# Patient Record
Sex: Female | Born: 1965 | Race: White | Hispanic: No | Marital: Married | Smoking: Former smoker
Health system: Southern US, Community
[De-identification: ages and names within clinical notes are randomized; demographics above are authoritative.]

## PROBLEM LIST (undated history)

## (undated) DIAGNOSIS — Z9889 Other specified postprocedural states: Secondary | ICD-10-CM

## (undated) DIAGNOSIS — S32009K Unspecified fracture of unspecified lumbar vertebra, subsequent encounter for fracture with nonunion: Secondary | ICD-10-CM

## (undated) DIAGNOSIS — T8859XA Other complications of anesthesia, initial encounter: Secondary | ICD-10-CM

## (undated) DIAGNOSIS — F419 Anxiety disorder, unspecified: Secondary | ICD-10-CM

## (undated) DIAGNOSIS — R112 Nausea with vomiting, unspecified: Secondary | ICD-10-CM

## (undated) HISTORY — PX: BREAST SURGERY: SHX581

## (undated) HISTORY — PX: BREAST IMPLANT REMOVAL: SUR1101

## (undated) HISTORY — PX: TUBAL LIGATION: SHX77

## (undated) HISTORY — PX: BACK SURGERY: SHX140

## (undated) HISTORY — PX: JOINT REPLACEMENT: SHX530

## (undated) HISTORY — PX: FOOT SURGERY: SHX648

## (undated) HISTORY — PX: ABLATION: SHX5711

---

## 1989-08-09 HISTORY — PX: WISDOM TOOTH EXTRACTION: SHX21

## 2003-08-10 HISTORY — PX: LIPOMA EXCISION: SHX5283

## 2010-07-24 ENCOUNTER — Emergency Department (HOSPITAL_BASED_OUTPATIENT_CLINIC_OR_DEPARTMENT_OTHER)
Admission: EM | Admit: 2010-07-24 | Discharge: 2010-07-24 | Payer: Self-pay | Source: Home / Self Care | Admitting: Emergency Medicine

## 2010-08-09 HISTORY — PX: APPENDECTOMY: SHX54

## 2010-08-09 HISTORY — PX: MENISCUS REPAIR: SHX5179

## 2011-04-03 ENCOUNTER — Emergency Department (HOSPITAL_BASED_OUTPATIENT_CLINIC_OR_DEPARTMENT_OTHER)
Admission: EM | Admit: 2011-04-03 | Discharge: 2011-04-03 | Disposition: A | Discharge status: Home / Self Care | Payer: Self-pay | Attending: Emergency Medicine | Admitting: Emergency Medicine

## 2011-04-03 ENCOUNTER — Encounter: Payer: Self-pay | Admitting: *Deleted

## 2011-04-03 DIAGNOSIS — G43909 Migraine, unspecified, not intractable, without status migrainosus: Secondary | ICD-10-CM | POA: Insufficient documentation

## 2011-04-03 DIAGNOSIS — R1110 Vomiting, unspecified: Secondary | ICD-10-CM | POA: Insufficient documentation

## 2011-04-03 DIAGNOSIS — A084 Viral intestinal infection, unspecified: Secondary | ICD-10-CM

## 2011-04-03 DIAGNOSIS — A088 Other specified intestinal infections: Secondary | ICD-10-CM | POA: Insufficient documentation

## 2011-04-03 LAB — URINALYSIS, ROUTINE W REFLEX MICROSCOPIC
Glucose, UA: NEGATIVE mg/dL
Ketones, ur: 15 mg/dL — AB
Leukocytes, UA: NEGATIVE
Nitrite: NEGATIVE
Protein, ur: NEGATIVE mg/dL
pH: 5.5 (ref 5.0–8.0)

## 2011-04-03 LAB — URINE MICROSCOPIC-ADD ON

## 2011-04-03 MED ORDER — METOCLOPRAMIDE HCL 5 MG/ML IJ SOLN
10.0000 mg | Freq: Once | INTRAMUSCULAR | Status: AC
  Administered 2011-04-03: 10 mg via INTRAVENOUS
  Filled 2011-04-03: qty 2

## 2011-04-03 MED ORDER — ONDANSETRON HCL 4 MG PO TABS: 4.0000 mg | ORAL_TABLET | Freq: Four times a day (QID) | ORAL | Status: AC

## 2011-04-03 MED ORDER — ONDANSETRON HCL 4 MG/2ML IJ SOLN
4.0000 mg | Freq: Once | INTRAMUSCULAR | Status: AC
  Administered 2011-04-03: 4 mg via INTRAVENOUS
  Filled 2011-04-03: qty 2

## 2011-04-03 MED ORDER — SODIUM CHLORIDE 0.9 % IV BOLUS (SEPSIS)
1000.0000 mL | Freq: Once | INTRAVENOUS | Status: AC
  Administered 2011-04-03: 1000 mL via INTRAVENOUS

## 2011-04-03 MED ORDER — DIPHENHYDRAMINE HCL 50 MG/ML IJ SOLN
25.0000 mg | Freq: Once | INTRAMUSCULAR | Status: AC
  Administered 2011-04-03: 25 mg via INTRAVENOUS
  Filled 2011-04-03: qty 1

## 2011-04-03 MED ORDER — KETOROLAC TROMETHAMINE 30 MG/ML IJ SOLN
30.0000 mg | Freq: Once | INTRAMUSCULAR | Status: AC
  Administered 2011-04-03: 30 mg via INTRAVENOUS
  Filled 2011-04-03: qty 1

## 2011-04-03 NOTE — ED Notes (Signed)
Pt c/o n/v/d onset earlier today. Also feeling weak, shaky and has a headache. Hx headaches and migraines.

## 2011-04-03 NOTE — ED Notes (Signed)
Pt provided cranberry juice per request (MD aware)

## 2011-04-03 NOTE — ED Provider Notes (Signed)
History   Chart scribed for [name], MD by Enos Fling; the patient was seen in room MH12/MH12; this patient's care was started at 6:47 PM.    CSN: [id number] Arrival date & time: 04/03/2011  6:39 PM  Chief Complaint  Patient presents with  . Emesis   HPI Autumn Rose is a 45 y.o. female who presents to the Emergency Department complaining of n/v/d. Pt reports n/v/d onset this AM and have been persistent throughout the day with associated headache and generalized weakness. N/V worse with eating. Pt reports h/o ha and this ha is same. No fever at home. No sick contacts or new/unusual foods. Denies blood in emesis or stool, vision changes, or dysuria. Pt drinking a lot of water today so urinating more than usual.    History reviewed. No pertinent past medical history.  Past Surgical History  Procedure Date  . Tubal ligation   . Breast surgery     No family history on file.  History  Substance Use Topics  . Smoking status: Never Smoker   . Smokeless tobacco: Not on file  . Alcohol Use: Yes    OB History    Grav Para Term Preterm Abortions TAB SAB Ect Mult Living                  Review of Systems 10 Systems reviewed and are negative for acute change except as noted in the HPI.  Physical Exam  BP 139/93  Pulse 110  Temp(Src) 98.5 F (36.9 C) (Oral)  Resp 20  Ht 5' 5.5" (1.664 m)  Wt 157 lb (71.215 kg)  BMI 25.73 kg/m2  SpO2 100%  LMP 03/13/2011  Physical Exam  Nursing note and vitals reviewed. Constitutional: She is oriented to person, place, and time. She appears well-developed and well-nourished. No distress.  HENT:  Head: Normocephalic.  Mouth/Throat: Oropharynx is clear and moist and mucous membranes are normal.  Eyes: Conjunctivae are normal. Pupils are equal, round, and reactive to light.  Neck: Normal range of motion. Neck supple.  Cardiovascular: Normal rate, regular rhythm and intact distal pulses.  Exam reveals no gallop and no friction  rub.   No murmur heard. Pulmonary/Chest: Effort normal and breath sounds normal. She has no wheezes. She has no rales.  Abdominal: Soft. Bowel sounds are normal. There is no tenderness.       No CVA tenderness  Musculoskeletal: Normal range of motion. She exhibits no edema.  Neurological: She is alert and oriented to person, place, and time.       Normal strength BUE and BLE  Skin: Skin is warm and dry. No rash noted.  Psychiatric: She has a normal mood and affect.    ED Course  Procedures  OTHER DATA REVIEWED: Nursing notes and vital signs reviewed. Prior records reviewed.   LABS / RADIOLOGY: Results for orders placed during the hospital encounter of 04/03/11  URINALYSIS, ROUTINE W REFLEX MICROSCOPIC      Component Value Range   Color, Urine YELLOW  YELLOW    Appearance CLEAR  CLEAR    Specific Gravity, Urine 1.013  1.005 - 1.030    pH 5.5  5.0 - 8.0    Glucose, UA NEGATIVE  NEGATIVE (mg/dL)   Hgb urine dipstick TRACE (*) NEGATIVE    Bilirubin Urine NEGATIVE  NEGATIVE    Ketones, ur 15 (*) NEGATIVE (mg/dL)   Protein, ur NEGATIVE  NEGATIVE (mg/dL)   Urobilinogen, UA 0.2  0.0 - 1.0 (mg/dL)  Nitrite NEGATIVE  NEGATIVE    Leukocytes, UA NEGATIVE  NEGATIVE   URINE MICROSCOPIC-ADD ON      Component Value Range   Squamous Epithelial / LPF FEW (*) RARE    WBC, UA 0-2  <3 (WBC/hpf)   RBC / HPF 0-2  <3 (RBC/hpf)   Bacteria, UA FEW (*) RARE      ED COURSE: Patient received a liter of IV fluids, headache cocktail with resolution of her symptoms. Patient is able to tolerate by mouth. States she's feeling better and wishes to be discharged home  MDM: Patient's symptoms are consistent with viral gastritis and migraine headache. There is no concern for malignant cause of headache such as subarachnoid hemorrhage or meningitis as this is consistent with the patient's prior headaches. Her symptoms have resolved. I encouraged fluid intake at home and she'll be discharged home with  Zofran. I explained that she should only treat the diarrhea when absolutely necessary such as in a work environment. We discussed the importance of followup. On reassessment her heart rate normalized it was approximately 90.  IMPRESSION: No diagnosis found.   PLAN: discharge All results reviewed and discussed with pt, questions answered, pt agreeable with plan.   CONDITION ON DISCHARGE: stable   MEDS GIVEN IN ED: sodium chloride 0.9 % bolus 1,000 mL (1000 mL Intravenous Given 04/03/11 1939)  ondansetron (ZOFRAN) injection 4 mg (4 mg Intravenous Given 04/03/11 1940)  metoCLOPramide (REGLAN) injection 10 mg (10 mg Intravenous Given 04/03/11 1940)  diphenhydrAMINE (BENADRYL) injection 25 mg (25 mg Intravenous Given 04/03/11 1941)  ketorolac (TORADOL) injection 30 mg (30 mg Intravenous Given 04/03/11 1940)     DISCHARGE MEDICATIONS: New Prescriptions   No medications on file     SCRIBE ATTESTATION: I personally performed the services described in this documentation, which was scribed in my presence. The recorded information has been reviewed and considered.        [name], MD 04/03/11 2042

## 2011-04-29 ENCOUNTER — Emergency Department (HOSPITAL_BASED_OUTPATIENT_CLINIC_OR_DEPARTMENT_OTHER)
Admission: EM | Admit: 2011-04-29 | Discharge: 2011-04-30 | Disposition: A | Discharge status: Other Acute Inpatient Hospital | Payer: Self-pay | Attending: Emergency Medicine | Admitting: Emergency Medicine

## 2011-04-29 ENCOUNTER — Encounter (HOSPITAL_BASED_OUTPATIENT_CLINIC_OR_DEPARTMENT_OTHER): Payer: Self-pay | Admitting: *Deleted

## 2011-04-29 ENCOUNTER — Emergency Department (INDEPENDENT_AMBULATORY_CARE_PROVIDER_SITE_OTHER): Payer: Self-pay

## 2011-04-29 DIAGNOSIS — K3580 Unspecified acute appendicitis: Secondary | ICD-10-CM | POA: Insufficient documentation

## 2011-04-29 DIAGNOSIS — R109 Unspecified abdominal pain: Secondary | ICD-10-CM

## 2011-04-29 DIAGNOSIS — N83209 Unspecified ovarian cyst, unspecified side: Secondary | ICD-10-CM

## 2011-04-29 DIAGNOSIS — R112 Nausea with vomiting, unspecified: Secondary | ICD-10-CM

## 2011-04-29 LAB — CBC
HCT: 41.6 % (ref 36.0–46.0)
MCHC: 35.8 g/dL (ref 30.0–36.0)
Platelets: 257 10*3/uL (ref 150–400)
RDW: 12.5 % (ref 11.5–15.5)
WBC: 24.1 10*3/uL — ABNORMAL HIGH (ref 4.0–10.5)

## 2011-04-29 LAB — URINALYSIS, ROUTINE W REFLEX MICROSCOPIC
Bilirubin Urine: NEGATIVE
Hgb urine dipstick: NEGATIVE
Ketones, ur: >80 mg/dL — AB
Nitrite: NEGATIVE
Specific Gravity, Urine: 1.029 (ref 1.005–1.030)
pH: 6 (ref 5.0–8.0)

## 2011-04-29 LAB — COMPREHENSIVE METABOLIC PANEL
ALT: 23 U/L (ref 0–35)
Alkaline Phosphatase: 56 U/L (ref 39–117)
BUN: 20 mg/dL (ref 6–23)
CO2: 24 mEq/L (ref 19–32)
Chloride: 103 mEq/L (ref 96–112)
GFR calc Af Amer: >60 mL/min (ref >60)
GFR calc non Af Amer: >60 mL/min (ref >60)
Glucose, Bld: 142 mg/dL — ABNORMAL HIGH (ref 70–99)
Potassium: 4.5 mEq/L (ref 3.5–5.1)
Sodium: 139 mEq/L (ref 135–145)
Total Bilirubin: 0.6 mg/dL (ref 0.3–1.2)
Total Protein: 7.8 g/dL (ref 6.0–8.3)

## 2011-04-29 LAB — DIFFERENTIAL
Basophils Absolute: 0 10*3/uL (ref 0.0–0.1)
Basophils Relative: 0 % (ref 0–1)
Lymphocytes Relative: 4 % — ABNORMAL LOW (ref 12–46)
Monocytes Absolute: 1.6 10*3/uL — ABNORMAL HIGH (ref 0.1–1.0)
Neutro Abs: 21.5 10*3/uL — ABNORMAL HIGH (ref 1.7–7.7)

## 2011-04-29 MED ORDER — SODIUM CHLORIDE 0.9 % IV SOLN: Freq: Once | INTRAVENOUS | Status: DC

## 2011-04-29 MED ORDER — ONDANSETRON HCL 4 MG/2ML IJ SOLN
4.0000 mg | Freq: Once | INTRAMUSCULAR | Status: AC
  Administered 2011-04-29: 4 mg via INTRAVENOUS
  Filled 2011-04-29: qty 2

## 2011-04-29 MED ORDER — IOHEXOL 300 MG/ML  SOLN
100.0000 mL | Freq: Once | INTRAMUSCULAR | Status: AC | PRN
  Administered 2011-04-29: 100 mL via INTRAVENOUS

## 2011-04-29 MED ORDER — MORPHINE SULFATE 4 MG/ML IJ SOLN
4.0000 mg | Freq: Once | INTRAMUSCULAR | Status: AC
  Administered 2011-04-29: 4 mg via INTRAVENOUS
  Filled 2011-04-29: qty 1

## 2011-04-29 MED ORDER — HYDROMORPHONE HCL 1 MG/ML IJ SOLN
1.0000 mg | Freq: Once | INTRAMUSCULAR | Status: AC
  Administered 2011-04-29: 1 mg via INTRAVENOUS
  Filled 2011-04-29: qty 1

## 2011-04-29 NOTE — ED Notes (Signed)
Pt unable to provide urine sample at this time 

## 2011-04-29 NOTE — ED Notes (Signed)
Pt sts she awoke this am with back pain that has now spread to her abdomen. Pt describes a stabbing, generalized pain. Pt sts she has vomited "from the pain".

## 2011-04-30 ENCOUNTER — Inpatient Hospital Stay (HOSPITAL_COMMUNITY)
Admission: EM | Admit: 2011-04-30 | Discharge: 2011-04-30 | DRG: 343 | Disposition: A | Discharge status: Home / Self Care | Payer: Self-pay | Source: Other Acute Inpatient Hospital | Attending: General Surgery | Admitting: General Surgery

## 2011-04-30 ENCOUNTER — Other Ambulatory Visit (INDEPENDENT_AMBULATORY_CARE_PROVIDER_SITE_OTHER): Payer: Self-pay | Admitting: Surgery

## 2011-04-30 DIAGNOSIS — R1031 Right lower quadrant pain: Secondary | ICD-10-CM

## 2011-04-30 DIAGNOSIS — R109 Unspecified abdominal pain: Secondary | ICD-10-CM | POA: Diagnosis present

## 2011-04-30 DIAGNOSIS — K3580 Unspecified acute appendicitis: Secondary | ICD-10-CM

## 2011-04-30 NOTE — Op Note (Signed)
Autumn Rose, Autumn Rose              ACCOUNT NO.:  [id number]  MEDICAL RECORD NO.:  [id number]  LOCATION:  0001                         FACILITY:  [facility]  PHYSICIAN:  Sandria Bales. Ezzard Standing, M.D.  DATE OF BIRTH:  [birth year]  DATE OF PROCEDURE: 30 Apr 2011                              OPERATIVE REPORT  PREOPERATIVE DIAGNOSIS:  Appendicitis.  POSTOPERATIVE DIAGNOSIS:  Gangrenous appendicitis.  PROCEDURE:  Laparoscopic appendectomy.  SURGEON:  Sandria Bales. Ezzard Standing, M.D.  FIRST ASSISTANT:  No first assistant.  ANESTHESIA:  General endotracheal with 20 cc of 0.25% Marcaine.  COMPLICATIONS:  None.  INDICATIONS FOR PROCEDURE:  Autumn Rose is a 45 year old white female, who sees Dr. Jenita Seashore at [facility] in [facility].  She developed acute abdominal pain, came to the [facility] near [facility], was diagnosed with appendicitis.  She was transferred to the [facility].  I discussed with her the indications, potential complications of appendix surgery.  The potential complications include, but are not limited to, bleeding, open surgery, and possible of another diagnosis.  OPERATIVE NOTE:  Patient placed in a supine position in room #1 with a general anesthesia supervised by Dr. Lestine Box.  She was given 1 g of cefoxitin initiation of procedure.  Abdomen was prepped with ChloraPrep.  She had a Foley catheter in place.  A time-out was held and surgical checklist run.  I went through an infraumbilical incision with sharp dissection carried down to the abdominal cavity.  A 0 degree 10 mm laparoscope was inserted through a 12 mm Hasson trocar and the Hasson trocar was secured with a 0 Vicryl suture.  I placed a second trocar in the right upper quadrant, which was a 5-mm trocar and a third trocar in the left lower quadrant, which was an 11 mm trocar.    Abdominal exploration revealed the gallbladder, liver, stomach, and bowel that I could see  was unremarkable.  Her uterus was unremarkable.  She had prior tubal ligation and her ovaries were unremarkable.    In a right lower quadrant, she had a gangrenous appendix, which was infarcted and it had of some purulence around it.  Some of the antimesenteric fold along the terminal ileum was attached to it.  I took this down.  I took the mesentery of the appendix down with a harmonic scalpel, got to the base of appendix.  I used a blue load of the 45 mm Ethicon Endo-GIA stapler and fired this across the base of the appendix.  I placed the appendix in EndoCatch bag and delivered through the umbilicus.  I then irrigated the abdomen with about 800 cc of saline.  I reinspected the staple line, which looked good.  I reinspected the mesentery, which looked good without any bleeding.    I then closed the umbilical incision with 0 Vicryl suture.  I closed the skin each incision with a 5-0 Vicryl suture.  I infiltrated about 20 cc of 0.25% Marcaine as a local anesthetic and covered the wounds with Dermabond.  The patient tolerated the procedure well, was transported to recovery room in good condition.  Sponge and needle counts were correct at the end of the case.  Sandria Bales. Ezzard Standing, M.D., FACS   DHN/MEDQ  D:  04/30/2011  T:  04/30/2011  Job:  [id number]  cc:   Almedia Balls, MD Fax: [phone]  Electronically Signed by Ovidio Kin M.D. on 04/30/2011 10:45:18 AM

## 2011-04-30 NOTE — H&P (Signed)
Autumn Rose, Autumn Rose              ACCOUNT NO.:  [id number]  MEDICAL RECORD NO.:  [id number]  LOCATION:  1S                           FACILITY:  [facility]  PHYSICIAN:  Sandria Bales. Ezzard Standing, M.D.  DATE OF BIRTH:  1966-07-14  DATE OF ADMISSION:  04/30/2011                             HISTORY & PHYSICAL   REASON FOR ADMISSION:  Appendicitis.  REFERRING PHYSICIAN:  Cyndra Numbers, MD of [place] near [facility].  HISTORY OF PRESENT ILLNESS:  Autumn Rose is a 45 year old white female who sees Dr. Jenita Seashore of [facility] as her primary medical doctor.  She awoke with some back pain this morning.  However, by early afternoon she started having increased abdominal pain.  She went to the [facility] ER at [facility] and was evaluated.  A CT scan revealed appendicitis and I was contacted.  She was transferred from the [facility] ER to the [facility].  She has no prior history of peptic ulcer disease.  She did have some gastroesophageal reflux disease, which actually was originally interpreted as possible heart disease about 2 years ago, but she is not on any chronic medicines for reflux.  She has no history of liver disease, gallbladder disease, pancreatic disease or colon disease.  She has had no prior abdominal surgery other than a tubal ligation.  PAST MEDICAL HISTORY:  She has no allergies.  She is on no medications.  REVIEW OF SYSTEMS:   NEUROLOGIC:  She has had no seizure or loss of consciousness.   PULMONARY:  Does not smoke cigarettes.  No history of pneumonia, tuberculosis, does not have asthma.   CARDIAC:  She had one issue in 2010 with a questionable heart disease, but she had a stress test, which was read as all negative.  She has not seen a cardiologist on a regular basis.   GASTROINTESTINAL:  See history of present illness. UROLOGIC:  No history of kidney infection.   GYN:  Her last period was about the January 27.  She has one son who is 103 years old.  She had  a urine pregnancy test, which was negative.   MUSCULOSKELETAL:  She has no history of joint or back trouble.  PERSONAL HISTORY:  Her husband is on the way.  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  PHYSICAL EXAMINATION:  VITAL SIGNS:  Pulse is 84. GENERAL:  She is a well-nourished, mildly obese white female, alert, cooperative with physical exam. HEENT:  Unremarkable.  The pupils are equal and round to light.  Mouth shows normal dentition. NECK:  Supple.  No mass or thyromegaly.  She has no cervical or supraclavicular adenopathy. LUNGS:  Clear to auscultation with symmetric breath sounds. HEART:  Has regular rate and rhythm without murmur or rub. BREASTS:  She has had augmentation.  She has not had a mammogram in a couple of years.  I discussed with her to get this updated. ABDOMEN:  She is tender in the right abdomen.  She has a tattoo  in her right lower quadrant of a frog.  I feel no hernia, no mass, yet she is mildly obese. EXTREMITIES:  She has good strength in her upper and lower extremities. NEUROLOGIC:  Grossly intact.  LABORATORY DATA:  Labs I have show a white blood count of 24,100. Hemoglobin is 14, hematocrit 41, platelet count 257,000.  Her sodium is 139, potassium 4.5, chloride 103, CO2 of 24.  Glucose of 142, BUN 20, creatinine 0.6 and urinalysis negative.  She had a CT scan, which was read by Dr. Sumner Boast that has evidence of acute appendicitis.  IMPRESSION:  Acute appendicitis.   I discussed with the patient the indications and complications of appendectomy.  I discussed the potential complications, which include but are not limited to, bleeding, open surgery, and the possibility that she will have another diagnosis of appendicitis.  We will proceed with surgery tonight.   Sandria Bales. Ezzard Standing, M.D.,  FACS   DHN/MEDQ  D:  04/30/2011  T:  04/30/2011  Job:  [id number]  cc:   Jenita Seashore MD  Electronically Signed by Ovidio Kin M.D. on 04/30/2011 10:42:11 AM

## 2011-04-30 NOTE — ED Notes (Signed)
Pt pain free [facility] at side benefits of surgery Reviewed with pt. Pt Verbalizes care plan well

## 2011-04-30 NOTE — ED Notes (Signed)
MD at bedside.Need for surgery reviewed pt and family verbalize care plan well Dr Alto Denver at side pt pain well controlled with Dilaudid

## 2011-05-06 ENCOUNTER — Emergency Department (HOSPITAL_COMMUNITY)
Admission: EM | Admit: 2011-05-06 | Discharge: 2011-05-06 | Disposition: A | Discharge status: Home / Self Care | Payer: Self-pay | Attending: Emergency Medicine | Admitting: Emergency Medicine

## 2011-05-06 ENCOUNTER — Emergency Department (HOSPITAL_COMMUNITY): Payer: Self-pay

## 2011-05-06 DIAGNOSIS — Z9089 Acquired absence of other organs: Secondary | ICD-10-CM | POA: Insufficient documentation

## 2011-05-06 DIAGNOSIS — R109 Unspecified abdominal pain: Secondary | ICD-10-CM | POA: Insufficient documentation

## 2011-05-06 DIAGNOSIS — R10813 Right lower quadrant abdominal tenderness: Secondary | ICD-10-CM | POA: Insufficient documentation

## 2011-05-06 LAB — DIFFERENTIAL
Eosinophils Absolute: 0.4 10*3/uL (ref 0.0–0.7)
Lymphs Abs: 1.1 10*3/uL (ref 0.7–4.0)
Monocytes Absolute: 0.7 10*3/uL (ref 0.1–1.0)
Monocytes Relative: 6 % (ref 3–12)
Neutrophils Relative %: 81 % — ABNORMAL HIGH (ref 43–77)

## 2011-05-06 LAB — COMPREHENSIVE METABOLIC PANEL
ALT: 20 U/L (ref 0–35)
AST: 15 U/L (ref 0–37)
Albumin: 3.5 g/dL (ref 3.5–5.2)
Alkaline Phosphatase: 57 U/L (ref 39–117)
BUN: 16 mg/dL (ref 6–23)
Chloride: 105 mEq/L (ref 96–112)
Potassium: 3.9 mEq/L (ref 3.5–5.1)
Sodium: 138 mEq/L (ref 135–145)
Total Protein: 7.2 g/dL (ref 6.0–8.3)

## 2011-05-06 LAB — POCT PREGNANCY, URINE: Preg Test, Ur: NEGATIVE

## 2011-05-06 LAB — CBC
HCT: 39.2 % (ref 36.0–46.0)
Hemoglobin: 13.6 g/dL (ref 12.0–15.0)
MCHC: 34.7 g/dL (ref 30.0–36.0)
RBC: 4.54 MIL/uL (ref 3.87–5.11)
WBC: 11.8 10*3/uL — ABNORMAL HIGH (ref 4.0–10.5)

## 2011-05-06 MED ORDER — IOHEXOL 300 MG/ML  SOLN
100.0000 mL | Freq: Once | INTRAMUSCULAR | Status: AC | PRN
  Administered 2011-05-06: 100 mL via INTRAVENOUS

## 2011-05-10 ENCOUNTER — Telehealth (INDEPENDENT_AMBULATORY_CARE_PROVIDER_SITE_OTHER): Payer: Self-pay

## 2011-05-10 NOTE — Telephone Encounter (Signed)
I called to check on the pt and see how she is doing.  There was no answer but I left her a message to call if she has any concerns. I also reminded her of her appointment.

## 2011-05-18 ENCOUNTER — Encounter (INDEPENDENT_AMBULATORY_CARE_PROVIDER_SITE_OTHER): Payer: Self-pay

## 2011-05-18 ENCOUNTER — Ambulatory Visit (INDEPENDENT_AMBULATORY_CARE_PROVIDER_SITE_OTHER): Payer: Self-pay | Admitting: Radiology

## 2011-05-18 VITALS — BP 104/68 | HR 78 | Temp 98.2°F | Resp 16 | Ht 65.5 in | Wt 160.6 lb

## 2011-05-18 DIAGNOSIS — K3580 Unspecified acute appendicitis: Secondary | ICD-10-CM

## 2011-05-18 NOTE — Progress Notes (Signed)
Autumn Rose 05-10-1966 [id number] 05/18/2011   History of Present Illness: Autumn Rose is a  45 y.o. female who presents today status post lap appy.  Pathology reveals appendicitis.  The patient is tolerating a regular diet, having normal bowel movements, has good pain control.  She is back to most normal activities.   Physical Exam: Abd: soft, nontender, active bowel sounds, nondistended.  All incisions are well healed.  Impression: 1.  Acute appendicitis, s/p lap appy  Plan: She is able to return to normal activities. She may follow up on a prn basis.

## 2011-05-20 NOTE — ED Provider Notes (Signed)
History     CSN: [id number] Arrival date & time: 04/29/2011  9:34 PM  Chief Complaint  Patient presents with  . Abdominal Pain    (Consider location/radiation/quality/duration/timing/severity/associated sxs/prior treatment) Patient is a 45 y.o. female presenting with abdominal pain. The history is provided by the patient and the spouse. No language interpreter was used.  Abdominal Pain The primary symptoms of the illness include abdominal pain, nausea and vomiting. The current episode started 13 to 24 hours ago. The onset of the illness was sudden. The problem has been rapidly worsening.  The abdominal pain is generalized. Pain radiation: Patient's pain actually began in her back and now has radiated anteriorly to her abdomen where the pain is focused. The severity of the abdominal pain is 10/10. The abdominal pain is relieved by nothing. The abdominal pain is exacerbated by movement (Palpation).  The patient states that she believes she is currently not pregnant. The patient has had a change in bowel habit (Patient reports being constipated). Additional symptoms associated with the illness include diaphoresis, constipation and back pain. Symptoms associated with the illness do not include chills, anorexia, heartburn, urgency, hematuria or frequency.    History reviewed. No pertinent past medical history.  Past Surgical History  Procedure Date  . Tubal ligation   . Breast surgery   . Appendectomy 2012  . Wisdom tooth extraction 1991    No family history on file.  History  Substance Use Topics  . Smoking status: Former Smoker    Types: Cigarettes    Quit date: 05/17/1986  . Smokeless tobacco: Not on file  . Alcohol Use: 0.6 oz/week    1 Glasses of wine per week    OB History    Grav Para Term Preterm Abortions TAB SAB Ect Mult Living                  Review of Systems  Constitutional: Positive for diaphoresis. Negative for chills.  HENT: Negative.   Eyes: Negative.     Respiratory: Negative.   Cardiovascular: Negative.   Gastrointestinal: Positive for nausea, vomiting, abdominal pain and constipation. Negative for heartburn and anorexia.  Genitourinary: Negative.  Negative for urgency, frequency and hematuria.  Musculoskeletal: Positive for back pain.  Skin: Negative.   Neurological: Negative.   Hematological: Negative.   Psychiatric/Behavioral: Negative.     Allergies  Review of patient's allergies indicates no known allergies.  Home Medications   Current Outpatient Rx  Name Route Sig Dispense Refill  . CALCIUM 600 + D PO Oral Take 1 tablet by mouth daily.      . OMEGA-3 FATTY ACIDS 1000 MG PO CAPS Oral Take 1 g by mouth daily.      . IBUPROFEN 200 MG PO TABS Oral Take 600 mg by mouth once as needed. For pain     . MULTI-VITAMIN/MINERALS PO TABS Oral Take 1 tablet by mouth daily.      Marland Kitchen HYDROCODONE-ACETAMINOPHEN 5-325 MG PO TABS Oral Take 1 tablet by mouth every 6 (six) hours as needed.        BP 120/77  Pulse 88  Temp(Src) 97.5 F (36.4 C) (Oral)  Resp 20  Ht 5\' 5"  (1.651 m)  Wt 157 lb (71.215 kg)  BMI 26.13 kg/m2  SpO2 100%  LMP 04/05/2011  Physical Exam  Nursing note and vitals reviewed. Constitutional: She is oriented to person, place, and time. She appears well-developed and well-nourished. No distress.  HENT:  Head: Normocephalic and atraumatic.  Eyes: Conjunctivae and  EOM are normal. Pupils are equal, round, and reactive to light.  Neck: Normal range of motion.  Cardiovascular: Normal rate, regular rhythm, normal heart sounds and intact distal pulses.  Exam reveals no gallop and no friction rub.   No murmur heard. Pulmonary/Chest: Effort normal and breath sounds normal. No respiratory distress. She has no wheezes. She has no rales.  Abdominal: Bowel sounds are absent. There is generalized tenderness. There is rigidity, rebound and guarding. There is no CVA tenderness.  Musculoskeletal: Normal range of motion.  Neurological:  She is alert and oriented to person, place, and time. No cranial nerve deficit. She exhibits normal muscle tone. Coordination normal.  Skin: Skin is warm and dry. No rash noted.  Psychiatric: She has a normal mood and affect.    ED Course  Procedures  Critical care: A total of 45 minutes critical care time was spent in the care of this patient. This included time spent reassessing the patient's pain, interpreting diagnostic studies, performing repeat reassessments, discussing the patient's plan of care with the patient and family, communicating with doctors at outlying facilities for transfer of the patient, in documentation.  Labs Reviewed  URINALYSIS, ROUTINE W REFLEX MICROSCOPIC - Abnormal; Notable for the following:    Color, Urine AMBER (*) BIOCHEMICALS MAY BE AFFECTED BY COLOR   Ketones, ur >80 (*)    All other components within normal limits  COMPREHENSIVE METABOLIC PANEL - Abnormal; Notable for the following:    Glucose, Bld 142 (*)    All other components within normal limits  CBC - Abnormal; Notable for the following:    WBC 24.1 (*)    All other components within normal limits  DIFFERENTIAL - Abnormal; Notable for the following:    Neutrophils Relative 89 (*)    Neutro Abs 21.5 (*)    Lymphocytes Relative 4 (*)    Monocytes Absolute 1.6 (*)    All other components within normal limits  PREGNANCY, URINE   No results found.   1. Acute appendicitis       MDM  Patient was evaluated by myself and found to have an acute abdomen. Patient's pain and nausea were controlled with medications here. Acute abdominal series showed no free air. CBC returned with a leukocytosis of 24.1. Patient's urine was remarkable mainly for ketones. Renal function was within normal limits and a CT of the abdomen and pelvis with IV contrast only was performed. This demonstrated acute appendicitis. Call was placed to on call surgeon at [facility]. He accepted the patient for transfer.  Transport was arranged. Patient was discharged in stable condition for transfer to [facility] hospital directly to the operating room.        Cyndra Numbers, MD 05/20/11 [phone]

## 2012-01-10 ENCOUNTER — Observation Stay (HOSPITAL_BASED_OUTPATIENT_CLINIC_OR_DEPARTMENT_OTHER)
Admission: EM | Admit: 2012-01-10 | Discharge: 2012-01-11 | Disposition: A | Discharge status: Home / Self Care | Payer: BC Managed Care – PPO | Attending: Emergency Medicine | Admitting: Emergency Medicine

## 2012-01-10 ENCOUNTER — Encounter (HOSPITAL_BASED_OUTPATIENT_CLINIC_OR_DEPARTMENT_OTHER): Payer: Self-pay | Admitting: *Deleted

## 2012-01-10 DIAGNOSIS — R0600 Dyspnea, unspecified: Secondary | ICD-10-CM

## 2012-01-10 DIAGNOSIS — R0602 Shortness of breath: Principal | ICD-10-CM | POA: Insufficient documentation

## 2012-01-10 NOTE — ED Notes (Signed)
Pt /o SOb x 1 hr, recent left knee surgery x 5 days ago

## 2012-01-10 NOTE — ED Notes (Signed)
Pt has no hx of asthma or an breathing issues but reported having surgery on her knee and is now [facility] which presented a few hours ago.

## 2012-01-11 ENCOUNTER — Emergency Department (HOSPITAL_COMMUNITY): Payer: BC Managed Care – PPO

## 2012-01-11 ENCOUNTER — Encounter (HOSPITAL_COMMUNITY): Payer: Self-pay | Admitting: Physician Assistant

## 2012-01-11 DIAGNOSIS — R0600 Dyspnea, unspecified: Secondary | ICD-10-CM | POA: Diagnosis present

## 2012-01-11 LAB — COMPREHENSIVE METABOLIC PANEL
Albumin: 3.6 g/dL (ref 3.5–5.2)
BUN: 13 mg/dL (ref 6–23)
CO2: 28 mEq/L (ref 19–32)
Chloride: 102 mEq/L (ref 96–112)
Creatinine, Ser: 0.8 mg/dL (ref 0.50–1.10)
GFR calc non Af Amer: 88 mL/min — ABNORMAL LOW (ref >90)
Total Bilirubin: 0.2 mg/dL — ABNORMAL LOW (ref 0.3–1.2)

## 2012-01-11 LAB — CBC
HCT: 39.3 % (ref 36.0–46.0)
MCH: 30.7 pg (ref 26.0–34.0)
MCV: 86.2 fL (ref 78.0–100.0)
RDW: 12.3 % (ref 11.5–15.5)
WBC: 8.6 10*3/uL (ref 4.0–10.5)

## 2012-01-11 LAB — POCT I-STAT TROPONIN I
Troponin i, poc: 0 ng/mL (ref 0.00–0.08)
Troponin i, poc: 0 ng/mL (ref 0.00–0.08)

## 2012-01-11 MED ORDER — IOHEXOL 350 MG/ML SOLN
80.0000 mL | Freq: Once | INTRAVENOUS | Status: AC | PRN
  Administered 2012-01-11: 80 mL via INTRAVENOUS

## 2012-01-11 MED ORDER — ENOXAPARIN SODIUM 80 MG/0.8ML ~~LOC~~ SOLN
1.0000 mg/kg | Freq: Once | SUBCUTANEOUS | Status: AC
  Administered 2012-01-11: 70 mg via SUBCUTANEOUS
  Filled 2012-01-11: qty 0.8

## 2012-01-11 NOTE — ED Provider Notes (Signed)
History     CSN: [id number]  Arrival date & time 01/10/12  2350   First MD Initiated Contact with Patient 01/11/12 0056      Chief Complaint  Patient presents with  . Shortness of Breath    (Consider location/radiation/quality/duration/timing/severity/associated sxs/prior treatment) HPI History provided by patient. Had left knee surgery 5 days ago/meniscus repair. Around 9 PM last night began feeling shortness of breath. Her only medications at home are hydrocodone and Lexapro she takes daily. No associated chest pain. No fevers or chills. No cough. No new leg pain or swelling. No history of PE or DVT or similar symptoms in the past. Her surgery was done at [facility] her primary care and orthopedic are. Symptoms are moderate in severity somewhat better than when they were earlier tonight. Patient was evaluated at [facility] and transferred here for a PE study.  No history of hypertension, hyperlipidemia, diabetes. No family history of early coronary artery disease. She is a former smoker. No known history of heart disease.  History reviewed. No pertinent past medical history.  Past Surgical History  Procedure Date  . Tubal ligation   . Breast surgery   . Appendectomy 2012  . Wisdom tooth extraction 1991  . Joint replacement     History reviewed. No pertinent family history.  History  Substance Use Topics  . Smoking status: Former Smoker    Types: Cigarettes    Quit date: 05/17/1986  . Smokeless tobacco: Not on file  . Alcohol Use: 0.6 oz/week    1 Glasses of wine per week    OB History    Grav Para Term Preterm Abortions TAB SAB Ect Mult Living                  Review of Systems  Constitutional: Negative for fever and chills.  HENT: Negative for neck pain and neck stiffness.   Eyes: Negative for pain.  Respiratory: Positive for shortness of breath.   Cardiovascular: Negative for chest pain.  Gastrointestinal: Negative for abdominal pain.    Genitourinary: Negative for dysuria.  Musculoskeletal: Negative for back pain.  Skin: Negative for rash.  Neurological: Negative for headaches.  All other systems reviewed and are negative.    Allergies  Review of patient's allergies indicates no known allergies.  Home Medications   Current Outpatient Rx  Name Route Sig Dispense Refill  . CALCIUM 600 + D PO Oral Take 1 tablet by mouth daily.      Marland Kitchen ESCITALOPRAM OXALATE 10 MG PO TABS Oral Take 10 mg by mouth daily.    . OMEGA-3 FATTY ACIDS 1000 MG PO CAPS Oral Take 1 g by mouth daily.      Marland Kitchen HYDROCODONE-ACETAMINOPHEN 5-325 MG PO TABS Oral Take 1 tablet by mouth every 6 (six) hours as needed. For pain    . MULTI-VITAMIN/MINERALS PO TABS Oral Take 1 tablet by mouth daily.        BP 91/61  Pulse 66  Temp(Src) 98.1 F (36.7 C) (Oral)  Resp 17  Ht 5\' 5"  (1.651 m)  Wt 155 lb (70.308 kg)  BMI 25.79 kg/m2  SpO2 100%  LMP 01/05/2012  Physical Exam  Constitutional: She is oriented to person, place, and time. She appears well-developed and well-nourished.  HENT:  Head: Normocephalic and atraumatic.  Eyes: Conjunctivae and EOM are normal. Pupils are equal, round, and reactive to light.  Neck: Trachea normal. Neck supple. No thyromegaly present.  Cardiovascular: Normal rate, regular rhythm,  S1 normal, S2 normal and normal pulses.     No systolic murmur is present   No diastolic murmur is present  Pulses:      Radial pulses are 2+ on the right side, and 2+ on the left side.  Pulmonary/Chest: Effort normal and breath sounds normal. She has no wheezes. She has no rhonchi. She has no rales. She exhibits no tenderness.  Abdominal: Soft. Normal appearance and bowel sounds are normal. There is no tenderness. There is no CVA tenderness and negative Murphy's sign.  Musculoskeletal:       Left knee tenderness and swelling with postoperative changes. BLE:s Calves nontender, no cords or erythema, negative Homans sign  Neurological: She is  alert and oriented to person, place, and time. She has normal strength. No cranial nerve deficit or sensory deficit. GCS eye subscore is 4. GCS verbal subscore is 5. GCS motor subscore is 6.  Skin: Skin is warm and dry. No rash noted. She is not diaphoretic.  Psychiatric: Her speech is normal.       Cooperative and appropriate    ED Course  Procedures (including critical care time)  Results for orders placed during the hospital encounter of 01/10/12  CBC      Component Value Range   WBC 8.6  4.0 - 10.5 (K/uL)   RBC 4.56  3.87 - 5.11 (MIL/uL)   Hemoglobin 14.0  12.0 - 15.0 (g/dL)   HCT 82.9  56.2 - 13.0 (%)   MCV 86.2  78.0 - 100.0 (fL)   MCH 30.7  26.0 - 34.0 (pg)   MCHC 35.6  30.0 - 36.0 (g/dL)   RDW 86.5  78.4 - 69.6 (%)   Platelets 260  150 - 400 (K/uL)  COMPREHENSIVE METABOLIC PANEL      Component Value Range   Sodium 139  135 - 145 (mEq/L)   Potassium 4.8  3.5 - 5.1 (mEq/L)   Chloride 102  96 - 112 (mEq/L)   CO2 28  19 - 32 (mEq/L)   Glucose, Bld 103 (*) 70 - 99 (mg/dL)   BUN 13  6 - 23 (mg/dL)   Creatinine, Ser 2.95  0.50 - 1.10 (mg/dL)   Calcium 28.4  8.4 - 10.5 (mg/dL)   Total Protein 7.1  6.0 - 8.3 (g/dL)   Albumin 3.6  3.5 - 5.2 (g/dL)   AST 26  0 - 37 (U/L)   ALT 28  0 - 35 (U/L)   Alkaline Phosphatase 77  39 - 117 (U/L)   Total Bilirubin 0.2 (*) 0.3 - 1.2 (mg/dL)   GFR calc non Af Amer 88 (*) >90 (mL/min)   GFR calc Af Amer >90  >90 (mL/min)  POCT I-STAT TROPONIN I      Component Value Range   Troponin i, poc 0.00  0.00 - 0.08 (ng/mL)   Comment 3            Ct Angio Chest W/cm &/or Wo Cm  01/11/2012  *RADIOLOGY REPORT*  Clinical Data: Chest pain and shortness of breath.  CT ANGIOGRAPHY CHEST  Technique:  Multidetector CT imaging of the chest using the standard protocol during bolus administration of intravenous contrast. Multiplanar reconstructed images including MIPs were obtained and reviewed to evaluate the vascular anatomy.  Contrast: 80mL OMNIPAQUE IOHEXOL  350 MG/ML SOLN  Comparison: None  Findings: The chest wall is unremarkable.  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  No supraclavicular or axillary lymphadenopathy.  The bony thorax is intact.  No destructive bone  lesions or spinal canal compromise.  The heart is normal in size.  No pericardial effusion.  No mediastinal or hilar lymphadenopathy.  The esophagus is grossly normal.  The aorta is normal in caliber.  The pulmonary arterial tree is well opacified.  No filling defects to suggest pulmonary emboli.  Examination of the lung parenchyma demonstrates no acute pulmonary findings.  No pleural effusion or pulmonary nodules.  IMPRESSION:  1.  No CT findings for pulmonary embolism. 2.  Normal thoracic aorta. 3.  No significant pulmonary findings.  Original Report Authenticated By: P. Loralie Champagne, M.D.      1. Dyspnea      Date: 01/11/2012  Rate: 66  Rhythm: normal sinus rhythm  QRS Axis: normal  Intervals: normal  ST/T Wave abnormalities: nonspecific T wave changes  Conduction Disutrbances:none  Narrative Interpretation: Inferior and lateral T-wave inversions, no old EKG available  Old EKG Reviewed: none available  Labs and CT scan obtained and reviewed as above. On recheck states her symptoms are still present and rates her dyspnea is 3/10. EKG obtained and reviewed as above. Troponin obtained and is negative. Symptoms ongoing for approximately 8 hours at time of neg troponin. Low risk for ACS. At long discussion with patient offered options and she prefers to have a stress test done here if possible.     MDM   Shortness of breath. No PE. Plan dobutamine stress test.         Sunnie Nielsen, MD 01/11/12 [id number]

## 2012-01-11 NOTE — ED Notes (Signed)
ECG shown to Dr. Hyman Hopes with older copy

## 2012-01-11 NOTE — H&P (Signed)
Autumn Rose is an 46 y.o. female.   Chief Complaint: shortness of breath HPI:   The patient is a 46 year old Caucasian female with a history of meniscal repair in her left knee last Thursday, appendectomy in September 2012 and had her tubes tied in 2010. Patient presented last night with shortness of breath.  She denies chest pain, orthopnea, PND, cough, congestion, palpitations, myalgia, dizziness, abdominal pain, lower extremity edema.    CT angiogram was negative for pulmonary embolism and no significant pulmonary findings. Her troponins have been negative x2.  EKG shows sinus rhythm T wave inversion/ flattening in 2, 3 aVF as well as V4 through 6.  Her dyspnea improved just prior to being moved to the CDU.  History reviewed. No pertinent past medical history.  Past Surgical History  Procedure Date  . Tubal ligation   . Breast surgery   . Appendectomy 2012  . Wisdom tooth extraction 1991  . Joint replacement     History reviewed. No pertinent family history. Social History:  reports that she quit smoking about 25 years ago. Her smoking use included Cigarettes. She does not have any smokeless tobacco history on file. She reports that she drinks about .6 ounces of alcohol per week. She reports that she does not use illicit drugs.  Allergies: No Known Allergies   (Not in a hospital admission)  Results for orders placed during the hospital encounter of 01/10/12 (from the past 48 hour(s))  CBC     Status: Normal   Collection Time   01/11/12 12:03 AM      Component Value Range Comment   WBC 8.6  4.0 - 10.5 (K/uL)    RBC 4.56  3.87 - 5.11 (MIL/uL)    Hemoglobin 14.0  12.0 - 15.0 (g/dL)    HCT 16.1  09.6 - 04.5 (%)    MCV 86.2  78.0 - 100.0 (fL)    MCH 30.7  26.0 - 34.0 (pg)    MCHC 35.6  30.0 - 36.0 (g/dL)    RDW 40.9  81.1 - 91.4 (%)    Platelets 260  150 - 400 (K/uL)   COMPREHENSIVE METABOLIC PANEL     Status: Abnormal   Collection Time   01/11/12 12:03 AM      Component  Value Range Comment   Sodium 139  135 - 145 (mEq/L)    Potassium 4.8  3.5 - 5.1 (mEq/L)    Chloride 102  96 - 112 (mEq/L)    CO2 28  19 - 32 (mEq/L)    Glucose, Bld 103 (*) 70 - 99 (mg/dL)    BUN 13  6 - 23 (mg/dL)    Creatinine, Ser 7.82  0.50 - 1.10 (mg/dL)    Calcium 95.6  8.4 - 10.5 (mg/dL)    Total Protein 7.1  6.0 - 8.3 (g/dL)    Albumin 3.6  3.5 - 5.2 (g/dL)    AST 26  0 - 37 (U/L)    ALT 28  0 - 35 (U/L)    Alkaline Phosphatase 77  39 - 117 (U/L)    Total Bilirubin 0.2 (*) 0.3 - 1.2 (mg/dL)    GFR calc non Af Amer 88 (*) >90 (mL/min)    GFR calc Af Amer >90  >90 (mL/min)   POCT I-STAT TROPONIN I     Status: Normal   Collection Time   01/11/12  4:39 AM      Component Value Range Comment   Troponin i, poc 0.00  0.00 - 0.08 (ng/mL)    Comment 3            POCT I-STAT TROPONIN I     Status: Normal   Collection Time   01/11/12  7:14 AM      Component Value Range Comment   Troponin i, poc 0.00  0.00 - 0.08 (ng/mL)    Comment 3             Ct Angio Chest W/cm &/or Wo Cm  01/11/2012  *RADIOLOGY REPORT*  Clinical Data: Chest pain and shortness of breath.  CT ANGIOGRAPHY CHEST  Technique:  Multidetector CT imaging of the chest using the standard protocol during bolus administration of intravenous contrast. Multiplanar reconstructed images including MIPs were obtained and reviewed to evaluate the vascular anatomy.  Contrast: 80mL OMNIPAQUE IOHEXOL 350 MG/ML SOLN  Comparison: None  Findings: The chest wall is unremarkable.  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  No supraclavicular or axillary lymphadenopathy.  The bony thorax is intact.  No destructive bone lesions or spinal canal compromise.  The heart is normal in size.  No pericardial effusion.  No mediastinal or hilar lymphadenopathy.  The esophagus is grossly normal.  The aorta is normal in caliber.  The pulmonary arterial tree is well opacified.  No filling defects to suggest pulmonary emboli.  Examination of the lung parenchyma  demonstrates no acute pulmonary findings.  No pleural effusion or pulmonary nodules.  IMPRESSION:  1.  No CT findings for pulmonary embolism. 2.  Normal thoracic aorta. 3.  No significant pulmonary findings.  Original Report Authenticated By: P. Loralie Champagne, M.D.    Review of Systems  Constitutional: Negative for fever and diaphoresis.  HENT: Negative for congestion, sore throat and neck pain.   Eyes: Negative for blurred vision.  Respiratory: Positive for shortness of breath (resolved). Negative for cough.   Cardiovascular: Negative for chest pain, orthopnea, leg swelling and PND.  Gastrointestinal: Negative for nausea, vomiting and abdominal pain.  Musculoskeletal: Negative for myalgias and back pain.  Neurological: Positive for dizziness (she reported some lightheadednesslast night). Negative for headaches.  Psychiatric/Behavioral: The patient is not nervous/anxious.     Blood pressure 98/70, pulse 66, temperature 98.4 F (36.9 C), resp. rate 14, height 5\' 5"  (1.651 m), weight 70.308 kg (155 lb), last menstrual period 01/05/2012, SpO2 97.00%. Physical Exam  Constitutional: She is oriented to person, place, and time. She appears well-developed and well-nourished. No distress.  HENT:  Head: Normocephalic and atraumatic.  Eyes: EOM are normal. Pupils are equal, round, and reactive to light. No scleral icterus.  Neck: Normal range of motion. Neck supple. No JVD present.  Cardiovascular: Normal rate and regular rhythm.   No murmur heard. Pulses:      Radial pulses are 2+ on the right side, and 2+ on the left side.       Dorsalis pedis pulses are 2+ on the right side, and 2+ on the left side.       No carotid bruits  Respiratory: Effort normal. No respiratory distress. She has no wheezes. She has no rales.  GI: Soft. Bowel sounds are normal. There is no tenderness.  Musculoskeletal: Normal range of motion. She exhibits no edema.  Lymphadenopathy:    She has no cervical adenopathy.    Neurological: She is alert and oriented to person, place, and time. She exhibits normal muscle tone.  Skin: Skin is warm and dry.  Psychiatric: She has a normal mood and affect.     Assessment/Plan  Patient Active Hospital Problem List: Dyspnea (01/11/2012)  Plan:  Patient had a negative CT angiogram of her chest. Cardiac enzymes are negative x2. Her dyspnea has resolved. EKG did show T-wave inversions in inferior and lateral leads but there is no old EKG to compare to. Is no family history of heart disease. I recommend outpatient nuclear stress test will have to be Lexi scan because she had meniscal surgery and cannot walk on a treadmill can also consider MET test I would have to wait until she can ride a bike.    HAGER, BRYAN 01/11/2012, 9:51 AM    Agree with note written by Jones Skene PAC  ATSP with SOB. 5 days S/P arthroscopic left knee surgery for torn meniscus. No CRF. Ekg shows inferior TWI (no old EKG to compare). Exam benign. SOB has resolved. CTA negative for PE. H/O stress test in [facility] remotely (neg). Labs otherwise OK. D/C home. ROV in 2-3 weeks with me or BH.  Runell Gess 01/11/2012 12:20 PM

## 2012-01-11 NOTE — ED Notes (Signed)
Meal tray ordered for patient.

## 2012-01-11 NOTE — ED Notes (Signed)
CARDIOLOGY HERE TO SEE PT 

## 2012-01-11 NOTE — ED Notes (Signed)
PA at bedside.

## 2012-01-11 NOTE — ED Notes (Signed)
Per EMS - pt had knee sx 5 days ago, pt transferred from [facility] to get a CT to rule out PE. Pt reports she felt SOB since 9pm.

## 2012-01-11 NOTE — ED Provider Notes (Signed)
History     CSN: [id number]  Arrival date & time 01/10/12  2350   First MD Initiated Contact with Patient 01/11/12 0056      Chief Complaint  Patient presents with  . Shortness of Breath    (Consider location/radiation/quality/duration/timing/severity/associated sxs/prior treatment) HPI This is a 46 year old white female who had laparoscopic surgery of her left knee 5 days ago. She was not placed on any postoperative anticoagulation for DVT prophylaxis. She is here with a three-hour history of shortness of breath. The onset was acute. There is no significant change with exertion or rest. Symptoms are moderate in severity. There is no associated chest pain. She's had no fever or cough. She has not noted any acute swelling of either leg.  History reviewed. No pertinent past medical history.  Past Surgical History  Procedure Date  . Tubal ligation   . Breast surgery   . Appendectomy 2012  . Wisdom tooth extraction 1991  . Joint replacement     History reviewed. No pertinent family history.  History  Substance Use Topics  . Smoking status: Former Smoker    Types: Cigarettes    Quit date: 05/17/1986  . Smokeless tobacco: Not on file  . Alcohol Use: 0.6 oz/week    1 Glasses of wine per week    OB History    Grav Para Term Preterm Abortions TAB SAB Ect Mult Living                  Review of Systems  All other systems reviewed and are negative.    Allergies  Review of patient's allergies indicates no known allergies.  Home Medications   Current Outpatient Rx  Name Route Sig Dispense Refill  . CALCIUM 600 + D PO Oral Take 1 tablet by mouth daily.      . OMEGA-3 FATTY ACIDS 1000 MG PO CAPS Oral Take 1 g by mouth daily.      Marland Kitchen HYDROCODONE-ACETAMINOPHEN 5-325 MG PO TABS Oral Take 1 tablet by mouth every 6 (six) hours as needed.      . IBUPROFEN 200 MG PO TABS Oral Take 600 mg by mouth once as needed. For pain     . MULTI-VITAMIN/MINERALS PO TABS Oral Take 1 tablet by  mouth daily.        BP 117/74  Pulse 78  Temp(Src) 98.1 F (36.7 C) (Oral)  Resp 16  Ht 5\' 5"  (1.651 m)  Wt 155 lb (70.308 kg)  BMI 25.79 kg/m2  SpO2 100%  LMP 01/05/2012  Physical Exam General: Well-developed, well-nourished female in no acute distress; appearance consistent with age of record HENT: normocephalic, atraumatic Eyes: pupils equal round and reactive to light; extraocular muscles intact Neck: supple Heart: regular rate and rhythm Lungs: clear to auscultation bilaterally Abdomen: soft; nondistended Extremities: No deformity; pulses normal; left knee an Ace bandage was decreased range of motion due to recent surgery; no edema of lower legs; negative Homans sign bilaterally Neurologic: Awake, alert and oriented; motor function intact in all extremities and symmetric; no facial droop Skin: Warm and dry Psychiatric: Anxious    ED Course  Procedures (including critical care time)    MDM   Nursing notes and vitals signs, including pulse oximetry, reviewed.  Summary of this visit's results, reviewed by myself:  Labs:  Results for orders placed during the hospital encounter of 01/10/12  CBC      Component Value Range   WBC 8.6  4.0 - 10.5 (K/uL)   RBC  4.56  3.87 - 5.11 (MIL/uL)   Hemoglobin 14.0  12.0 - 15.0 (g/dL)   HCT 62.9  52.8 - 41.3 (%)   MCV 86.2  78.0 - 100.0 (fL)   MCH 30.7  26.0 - 34.0 (pg)   MCHC 35.6  30.0 - 36.0 (g/dL)   RDW 24.4  01.0 - 27.2 (%)   Platelets 260  150 - 400 (K/uL)  COMPREHENSIVE METABOLIC PANEL      Component Value Range   Sodium 139  135 - 145 (mEq/L)   Potassium 4.8  3.5 - 5.1 (mEq/L)   Chloride 102  96 - 112 (mEq/L)   CO2 28  19 - 32 (mEq/L)   Glucose, Bld 103 (*) 70 - 99 (mg/dL)   BUN 13  6 - 23 (mg/dL)   Creatinine, Ser 5.36  0.50 - 1.10 (mg/dL)   Calcium 64.4  8.4 - 10.5 (mg/dL)   Total Protein 7.1  6.0 - 8.3 (g/dL)   Albumin 3.6  3.5 - 5.2 (g/dL)   AST 26  0 - 37 (U/L)   ALT 28  0 - 35 (U/L)   Alkaline  Phosphatase 77  39 - 117 (U/L)   Total Bilirubin 0.2 (*) 0.3 - 1.2 (mg/dL)   GFR calc non Af Amer 88 (*) >90 (mL/min)   GFR calc Af Amer >90  >90 (mL/min)    Imaging Studies: No results found.  1:05 AM This facility has the inability to perform a CTA chest this time. We will provide Lovenox and transfer to [facility] for definitive study.          Hanley Seamen, MD 01/11/12 [phone]

## 2012-01-11 NOTE — ED Notes (Signed)
Pt on stretcher, moved here from [facility] 6.

## 2012-01-11 NOTE — Discharge Instructions (Signed)
Dr. Hazle Coca office will call to set up an appointment for follow up.  If your symptoms return or if you have any concerns, don't hesitate to return to ER.    Shortness of Breath Shortness of breath (dyspnea) is the feeling of uneasy breathing. Shortness of breath does not always mean that there is a life-threatening illness. However, shortness of breath requires immediate medical care. CAUSES  Causes for shortness of breath include:  Not enough oxygen in the air (as with high altitudes or with a smoke-filled room).   Short-term (acute) lung disease, including:   Infections such as pneumonia.   Fluid in the lungs, such as heart failure.   A blood clot in the lungs (pulmonary embolism).   Lasting (chronic) lung diseases.   Heart disease (heart attack, angina, heart failure, and others).   Low red blood cells (anemia).   Poor physical fitness. This can cause shortness of breath when you exercise.   Chest or back injuries or stiffness.   Being overweight (obese).   Anxiety. This can make you feel like you are not getting enough air.  DIAGNOSIS  Serious medical problems can usually be found during your physical exam. Many tests may also be done to determine why you are having shortness of breath. Tests include:  Chest X-rays.   Lung function tests.   Blood tests.   Electrocardiography.   Exercise testing.   A cardiac echo.   Imaging scans.  Your caregiver may not be able to find a cause for your shortness of breath after your exam. In this case, it is important to have a follow-up exam with your caregiver as directed.  HOME CARE INSTRUCTIONS   Do not smoke. Smoking is a common cause of shortness of breath. Ask for help to stop smoking.   Avoid being around chemicals that may bother your breathing (paint fumes, dust).   Rest as needed. Slowly resume your usual activities.   If medicines were prescribed, take them as directed for the full length of time directed. This  includes oxygen and any inhaled medicines.   Follow up with your caregiver as directed. Waiting to do so or failure to follow up could result in worsening of your condition and possible disability or death.   Be sure you understand what to do or who to call if your shortness of breath worsens.  SEEK MEDICAL CARE IF:   Your condition does not improve in the time expected.   You have a hard time doing your normal activities even with rest.   You have any side effects or problems with the medicines prescribed.   You develop any new symptoms.  SEEK IMMEDIATE MEDICAL CARE IF:   Your shortness of breath is getting worse.   You feel lightheaded, faint, or develop a cough not controlled with medicines.   You start coughing up blood.   You have pain with breathing.   You have chest pain or pain in your arms, shoulders, or abdomen.   You have a fever.   You are unable to walk up stairs or exercise the way you normally do.   Your symptoms are getting worse.  Document Released: 04/20/2001 Document Revised: 07/15/2011 Document Reviewed: 12/06/2007 [facility] Patient Information 2012 [place], [place].

## 2012-01-11 NOTE — ED Provider Notes (Signed)
At risk for ACS.  Needs dobutamine stress test as pt has recent L knee surgery and will likely unable to participate in echo stress test and also has PE study last night, and cannot have more contrast media.  Will need to check i-stat troponin @ 9am   7:40 AM I have consulted with unassigned cardiology [facility]) who agrees to see pt in ED to determine further testing.    10:41 AM Cardiology has seen and evaluated pt. Recommendation are as follow:   Plan: Patient had a negative CT angiogram of her chest. Cardiac enzymes are negative x2. Her dyspnea has resolved. EKG did show T-wave inversions in inferior and lateral leads but there is no old EKG to compare to. Is no family history of heart disease.  I recommend outpatient nuclear stress test will have to be Lexi scan because she had meniscal surgery and cannot walk on a treadmill can also consider MET test I would have to wait until she can ride a bike.   12:54 PM Dr. Allyson Sabal agrees with plan to have pt f/u in office in the next 2-3 weeks for follow up.  Pt agrees with plan.  Currently denies any sxs.  VSS. Stable to be d/c  Fayrene Helper, PA-C 01/11/12 1255

## 2014-01-24 DIAGNOSIS — K219 Gastro-esophageal reflux disease without esophagitis: Secondary | ICD-10-CM | POA: Insufficient documentation

## 2015-01-16 DIAGNOSIS — F411 Generalized anxiety disorder: Secondary | ICD-10-CM | POA: Insufficient documentation

## 2015-02-28 DIAGNOSIS — M1A00X0 Idiopathic chronic gout, unspecified site, without tophus (tophi): Secondary | ICD-10-CM | POA: Insufficient documentation

## 2015-04-03 DIAGNOSIS — M5412 Radiculopathy, cervical region: Secondary | ICD-10-CM | POA: Insufficient documentation

## 2016-09-17 ENCOUNTER — Emergency Department (HOSPITAL_BASED_OUTPATIENT_CLINIC_OR_DEPARTMENT_OTHER): Payer: Managed Care, Other (non HMO)

## 2016-09-17 ENCOUNTER — Emergency Department (HOSPITAL_BASED_OUTPATIENT_CLINIC_OR_DEPARTMENT_OTHER)
Admission: EM | Admit: 2016-09-17 | Discharge: 2016-09-17 | Disposition: A | Discharge status: Home / Self Care | Payer: Managed Care, Other (non HMO) | Attending: Emergency Medicine | Admitting: Emergency Medicine

## 2016-09-17 ENCOUNTER — Encounter (HOSPITAL_BASED_OUTPATIENT_CLINIC_OR_DEPARTMENT_OTHER): Payer: Self-pay | Admitting: *Deleted

## 2016-09-17 ENCOUNTER — Other Ambulatory Visit: Payer: Self-pay

## 2016-09-17 DIAGNOSIS — R0789 Other chest pain: Secondary | ICD-10-CM | POA: Diagnosis not present

## 2016-09-17 DIAGNOSIS — Z79899 Other long term (current) drug therapy: Secondary | ICD-10-CM | POA: Diagnosis not present

## 2016-09-17 DIAGNOSIS — Z87891 Personal history of nicotine dependence: Secondary | ICD-10-CM | POA: Insufficient documentation

## 2016-09-17 DIAGNOSIS — R0602 Shortness of breath: Secondary | ICD-10-CM | POA: Diagnosis present

## 2016-09-17 DIAGNOSIS — R42 Dizziness and giddiness: Secondary | ICD-10-CM | POA: Insufficient documentation

## 2016-09-17 LAB — CBC
HEMATOCRIT: 41.6 % (ref 36.0–46.0)
HEMOGLOBIN: 14.6 g/dL (ref 12.0–15.0)
MCH: 29 pg (ref 26.0–34.0)
MCHC: 35.1 g/dL (ref 30.0–36.0)
MCV: 82.5 fL (ref 78.0–100.0)
Platelets: 284 10*3/uL (ref 150–400)
RBC: 5.04 MIL/uL (ref 3.87–5.11)
RDW: 13.3 % (ref 11.5–15.5)
WBC: 6.5 10*3/uL (ref 4.0–10.5)

## 2016-09-17 LAB — BASIC METABOLIC PANEL
ANION GAP: 8 (ref 5–15)
BUN: 13 mg/dL (ref 6–20)
CHLORIDE: 105 mmol/L (ref 101–111)
CO2: 26 mmol/L (ref 22–32)
Calcium: 9.8 mg/dL (ref 8.9–10.3)
Creatinine, Ser: 0.74 mg/dL (ref 0.44–1.00)
GFR calc Af Amer: >60 mL/min (ref >60)
GFR calc non Af Amer: >60 mL/min (ref >60)
Glucose, Bld: 89 mg/dL (ref 65–99)
POTASSIUM: 3.5 mmol/L (ref 3.5–5.1)
Sodium: 139 mmol/L (ref 135–145)

## 2016-09-17 LAB — TROPONIN I
Troponin I: <0.03 ng/mL (ref <0.03)
Troponin I: <0.03 ng/mL (ref <0.03)

## 2016-09-17 MED ORDER — ASPIRIN 81 MG PO CHEW
324.0000 mg | CHEWABLE_TABLET | Freq: Once | ORAL | Status: AC
  Administered 2016-09-17: 324 mg via ORAL
  Filled 2016-09-17: qty 4

## 2016-09-17 MED ORDER — NITROGLYCERIN 0.4 MG SL SUBL
0.4000 mg | SUBLINGUAL_TABLET | SUBLINGUAL | Status: AC | PRN
  Administered 2016-09-17 (×3): 0.4 mg via SUBLINGUAL
  Filled 2016-09-17: qty 1

## 2016-09-17 NOTE — Discharge Instructions (Addendum)
It is nice taking care of you! It is unclear what caused your pain but there are no signs of heart attack, blood clot, or other heart or lung emergencies based on the tests we have done so far. Recommend follow-up with your primary care doctor and Cardiology as above.    Please seek immediate care if you have worsening of chest pain, trouble breathing or other symptoms concerning to you.

## 2016-09-17 NOTE — ED Provider Notes (Signed)
[facility] Provider Note   CSN: [id number] Arrival date & time: 09/17/16  1227     History   Chief Complaint Chief Complaint  Patient presents with  . Chest Pain  . Shortness of Breath    HPI Autumn Rose is a 51 y.o. female.  HPI Patient was in her usual state of health until 11:45 am this morning when she felt a sharp left-sided chest pain. Patient was sitting and working on her computer when this happened. Pain radiated to her left arm. She denies radiation to her back. She also felt lightheaded, short of breath and some tingling in her tongue.  Denies headache, vision changes, slurred speech, facial drooping, fever, chills, sore throat, palpitation, diaphoresis, nausea, vomiting, abdominal pain, diarrhea, dysuria, swelling or pain in her legs or focal neurologic symptoms.  She denies personal or family history of blood clot. She is not on oral contraceptive. No recent surgery or long travel. She denies history hypertension, diabetes or heart disease. She reports history of bad GERD and takes omeprazole at home. She denies family history cardiac disease except hypertension. She denies smoking or recreational drug use. She admits occ EtOH use but not recently.  Off note, she states having sinusitis recently. She is on amoxicillin for that.  History reviewed. No pertinent past medical history.  Patient Active Problem List   Diagnosis Date Noted  . Dyspnea 01/11/2012  . Appendicitis, acute 05/18/2011    Past Surgical History:  Procedure Laterality Date  . APPENDECTOMY  2012  . BREAST SURGERY    . JOINT REPLACEMENT    . TUBAL LIGATION    . WISDOM TOOTH EXTRACTION  1991    OB History    No data available       Home Medications    Prior to Admission medications   Medication Sig Start Date End Date Taking? Authorizing Provider  AMOXICILLIN PO Take by mouth.   Yes Historical Provider, MD  Calcium Carbonate-Vitamin D (CALCIUM 600 + D PO) Take 1 tablet by  mouth daily.      Historical Provider, MD  escitalopram (LEXAPRO) 10 MG tablet Take 10 mg by mouth daily.    Historical Provider, MD  fish oil-omega-3 fatty acids 1000 MG capsule Take 1 g by mouth daily.      Historical Provider, MD  Multiple Vitamins-Minerals (MULTIVITAMIN WITH MINERALS) tablet Take 1 tablet by mouth daily.      Historical Provider, MD    Family History Family History  Problem Relation Age of Onset  . Hypertension Mother   . Hyperlipidemia Father     Social History Social History  Substance Use Topics  . Smoking status: Former Smoker    Types: Cigarettes    Quit date: 05/17/1986  . Smokeless tobacco: Never Used  . Alcohol use 0.6 oz/week    1 Glasses of wine per week     Allergies   Patient has no known allergies.   Review of Systems Review of Systems  Constitutional: Negative for chills and fever.  HENT: Negative for congestion, rhinorrhea and sore throat.   Eyes: Negative for photophobia and visual disturbance.  Respiratory: Negative for chest tightness.   Cardiovascular: Negative for chest pain and palpitations.  Gastrointestinal: Negative for abdominal pain, diarrhea, nausea and vomiting.  Genitourinary: Negative for dysuria.  Musculoskeletal: Negative for myalgias.  Skin: Negative for rash.  Neurological: Positive for light-headedness. Negative for numbness and headaches.  Psychiatric/Behavioral: Negative for confusion.    Physical Exam Updated Vital  Signs BP 107/72   Pulse 80   Temp 98.2 F (36.8 C) (Oral)   Resp 20   Ht 5\' 5"  (1.651 m)   Wt 76.7 kg   LMP 09/14/2016   SpO2 100%   BMI 28.12 kg/m   Physical Exam GEN: appears well, no apparent distress. Oropharynx: mmm without erythema or exudation HEM: negative for cervical or periauricular lymphadenopathies CVS: RRR, nl s1 & s2, no murmurs, no edema, cap refills < 2 secs RESP: speaks in full sentence, no IWOB, good air movement bilaterally, CTAB GI: BS present & normal, soft, NTND,  no guarding, no rebound, no mass MSK: no focal tenderness or notable swelling SKIN: no apparent skin lesion NEURO: alert and oiented appropriately, CN II-XII intact, motor 5/5 in all extremities, light sensation intact in all dermatomes upper and lower extremities, no pronator drift, patellar reflex 2+ bilaterally, finger to nose intact.  PSYCH: euthymic mood with congruent affect  ED Treatments / Results  Labs (all labs ordered are listed, but only abnormal results are displayed) Labs Reviewed  BASIC METABOLIC PANEL  CBC  TROPONIN I  TROPONIN I    EKG  EKG Interpretation  Date/Time:  Friday September 17 2016 16:38:55 EST Ventricular Rate:  61 PR Interval:  114 QRS Duration: 73 QT Interval:  445 QTC Calculation: 449 R Axis:   6 Text Interpretation:  Sinus rhythm Low voltage, precordial leads Borderline T abnormalities, anterior leads Baseline wander in lead(s) V1 V5 No significant change since last tracing Confirmed by [facility] MD, ERIN ([id number]) on 09/17/2016 5:12:41 PM       Radiology Dg Chest 2 View  Result Date: 09/17/2016 CLINICAL DATA:  Chest pain EXAM: CHEST  2 VIEW COMPARISON:  01/11/2012 chest CT angiogram. FINDINGS: Normal heart size. Normal mediastinal contour. No pneumothorax. No pleural effusion. Lungs appear clear, with no acute consolidative airspace disease and no pulmonary edema. IMPRESSION: No active cardiopulmonary disease. Electronically Signed   By: Delbert Phenix M.D.   On: 09/17/2016 12:54    Procedures Procedures (including critical care time)  Medications Ordered in ED Medications  aspirin chewable tablet 324 mg (324 mg Oral Given 09/17/16 1412)  nitroGLYCERIN (NITROSTAT) SL tablet 0.4 mg (0.4 mg Sublingual Given 09/17/16 1450)     Initial Impression / Assessment and Plan / ED Course  I have reviewed the triage vital signs and the nursing notes.  Pertinent labs & imaging results that were available during my care of the patient were reviewed by me and  considered in my medical decision making (see chart for details).  Patient with atypical chest pain. ACS ruled out by serial troponin x2 and two unchanged EKG's from her old EKG. She has HEART score of 2 (age and obesity). PERC score 0 for PE. No infectious signs and symptoms to suggest pneumonia. CBC within normal limit. She had transient dyspnea when she had chest pain that has resolved. Chest x-ray without cardiopulmonary process as well. Neuro exam within normal limits to think of CVA. She reports history of bad GERD and takes omeprazole at home.  Discharged home to follow up with her PCP.   Final Clinical Impressions(s) / ED Diagnoses   Final diagnoses:  Atypical chest pain    New Prescriptions Discharge Medication List as of 09/17/2016  5:16 PM     Orders Placed This Encounter  Procedures  . DG Chest 2 View  . Basic metabolic panel  . CBC  . Troponin I  . Troponin I  . Cardiac monitoring  .  Saline Lock IV, Maintain IV access  . Pulse oximetry, continuous  . ED EKG  . EKG 12-Lead  . ED EKG  . EKG 12-Lead     Almon Hercules, MD 09/17/16 2002    Alvira Monday, MD 09/20/16 2333

## 2016-09-17 NOTE — ED Notes (Signed)
Patient transported to X-ray 

## 2016-09-17 NOTE — ED Triage Notes (Signed)
Chest pain and sob x 45 minutes. Her lips are tingling.

## 2016-09-23 ENCOUNTER — Ambulatory Visit (INDEPENDENT_AMBULATORY_CARE_PROVIDER_SITE_OTHER): Payer: Managed Care, Other (non HMO) | Admitting: Cardiology

## 2016-09-23 ENCOUNTER — Encounter: Payer: Self-pay | Admitting: Cardiology

## 2016-09-23 VITALS — BP 122/85 | HR 85 | Ht 65.0 in | Wt 168.8 lb

## 2016-09-23 DIAGNOSIS — R9431 Abnormal electrocardiogram [ECG] [EKG]: Secondary | ICD-10-CM

## 2016-09-23 DIAGNOSIS — R079 Chest pain, unspecified: Secondary | ICD-10-CM | POA: Diagnosis not present

## 2016-09-23 NOTE — Assessment & Plan Note (Signed)
TWI inferior leads and in V5

## 2016-09-23 NOTE — Assessment & Plan Note (Signed)
Pt seen in the ED 09/17/16 after an episode of chest pain at work

## 2016-09-23 NOTE — Patient Instructions (Signed)
Schedule echocardiogram    Schedule stress myoview     Your physician recommends that you schedule a follow-up appointment with Dr.Berry after test

## 2016-09-23 NOTE — Progress Notes (Signed)
09/23/2016 Autumn Rose   [birth year]  [id number]  Primary Physician Almedia BallsKELLY,SAM, MD Primary Cardiologist: Dr Allyson SabalBerry  HPI:  51 y/o married female with a history of chest pain after knee surgery in 2013. She ruled out for a PE and Dr Allyson SabalBerry saw her in consult.  An OP stress was suggested but never done. She tells me she had what sounds like a Nuclear Stress in [place] when she was in her 30's. She has done well since we saw her in 2013, no hospitalizations or surgeries.   On 09/17/16 she was at work at her desk when she had sudden mid sternal chest discomfort associated Lt arm radiation and SOB. She also says she had tingling around her mouth and got sweaty. Her symptoms lasted more than an hour. In the ED her Troponin was negative. Her EKG showed TWI in lead 2 and 3 that resolved a few hours later. She has not had exertional symptoms but admits she doesn't do anything physical. She denies palpitations or tachycardia. She says she was under no particular stress when she had this episode Friday.    Current Outpatient Prescriptions  Medication Sig Dispense Refill  . Calcium Carbonate-Vitamin D (CALCIUM 600 + D PO) Take 1 tablet by mouth daily.      . fish oil-omega-3 fatty acids 1000 MG capsule Take 1 g by mouth daily.      . Multiple Vitamins-Minerals (MULTIVITAMIN WITH MINERALS) tablet Take 1 tablet by mouth daily.      Marland Kitchen. omeprazole (PRILOSEC) 40 MG capsule Take 40 mg by mouth daily.     No current facility-administered medications for this visit.     No Known Allergies  Social History   Social History  . Marital status: Married    Spouse name: N/A  . Number of children: N/A  . Years of education: N/A   Occupational History  . Not on file.   Social History Main Topics  . Smoking status: Former Smoker    Types: Cigarettes    Quit date: 05/17/1986  . Smokeless tobacco: Never Used  . Alcohol use 0.6 oz/week    1 Glasses of wine per week  . Drug use: No  . Sexual activity:  Yes    Birth control/ protection: Surgical   Other Topics Concern  . Not on file   Social History Narrative  . No narrative on file     Review of Systems: General: negative for chills, fever, night sweats or weight changes.  Cardiovascular: negative for dyspnea on exertion, edema, orthopnea, palpitations, paroxysmal nocturnal dyspnea or shortness of breath Dermatological: negative for rash Respiratory: negative for cough or wheezing Urologic: negative for hematuria Abdominal: negative for nausea, vomiting, diarrhea, bright red blood per rectum, melena, or hematemesis Neurologic: negative for visual changes, syncope, or dizziness All other systems reviewed and are otherwise negative except as noted above.    Blood pressure 122/85, pulse 85, height 5\' 5"  (1.651 m), weight 168 lb 12.8 oz (76.6 kg), last menstrual period 09/14/2016, SpO2 99 %.  General appearance: alert, cooperative, no distress and mildly obese Neck: no carotid bruit and no JVD Lungs: clear to auscultation bilaterally Heart: regular rate and rhythm Abdomen: soft, non-tender; bowel sounds normal; no masses,  no organomegaly Extremities: extremities normal, atraumatic, no cyanosis or edema Pulses: 2+ and symmetric Skin: Skin color, texture, turgor normal. No rashes or lesions Neurologic: Grossly normal   ASSESSMENT AND PLAN:   Chest pain with moderate risk of acute coronary  syndrome Pt seen in the ED 09/17/16 after an episode of chest pain at work  Abnormal EKG TWI inferior leads and in V5   PLAN  Exercise Myoview and echo, follow up with Dr Allyson Sabal after this.  Corine Shelter PA-C 09/23/2016 2:28 PM

## 2016-09-30 ENCOUNTER — Telehealth (HOSPITAL_COMMUNITY): Payer: Self-pay

## 2016-09-30 NOTE — Telephone Encounter (Signed)
Encounter complete. 

## 2016-10-06 ENCOUNTER — Ambulatory Visit (HOSPITAL_COMMUNITY)
Admission: RE | Admit: 2016-10-06 | Discharge: 2016-10-06 | Disposition: A | Discharge status: Home / Self Care | Payer: Managed Care, Other (non HMO) | Source: Ambulatory Visit | Attending: Cardiovascular Disease | Admitting: Cardiovascular Disease

## 2016-10-06 DIAGNOSIS — R079 Chest pain, unspecified: Secondary | ICD-10-CM | POA: Insufficient documentation

## 2016-10-06 DIAGNOSIS — R9431 Abnormal electrocardiogram [ECG] [EKG]: Secondary | ICD-10-CM

## 2016-10-06 DIAGNOSIS — Z87891 Personal history of nicotine dependence: Secondary | ICD-10-CM | POA: Insufficient documentation

## 2016-10-06 LAB — MYOCARDIAL PERFUSION IMAGING
Estimated workload: 10.1 METS
Exercise duration (min): 8 min
Exercise duration (sec): 9 s
LV dias vol: 64 mL (ref 46–106)
LV sys vol: 18 mL
MPHR: 171 {beats}/min
Peak HR: 166 {beats}/min
Percent HR: 97 %
RPE: 18
Rest HR: 61 {beats}/min
SDS: 0
SRS: 0
SSS: 0
TID: 0.93

## 2016-10-06 MED ORDER — TECHNETIUM TC 99M TETROFOSMIN IV KIT
10.3000 | PACK | Freq: Once | INTRAVENOUS | Status: AC | PRN
  Administered 2016-10-06: 10.3 via INTRAVENOUS
  Filled 2016-10-06: qty 11

## 2016-10-06 MED ORDER — TECHNETIUM TC 99M TETROFOSMIN IV KIT
29.9000 | PACK | Freq: Once | INTRAVENOUS | Status: AC | PRN
  Administered 2016-10-06: 29.9 via INTRAVENOUS
  Filled 2016-10-06: qty 30

## 2016-10-12 ENCOUNTER — Ambulatory Visit (HOSPITAL_COMMUNITY): Payer: Managed Care, Other (non HMO) | Attending: Cardiology

## 2016-10-12 ENCOUNTER — Other Ambulatory Visit: Payer: Self-pay

## 2016-10-12 DIAGNOSIS — Z87891 Personal history of nicotine dependence: Secondary | ICD-10-CM | POA: Diagnosis not present

## 2016-10-12 DIAGNOSIS — R9431 Abnormal electrocardiogram [ECG] [EKG]: Secondary | ICD-10-CM | POA: Diagnosis present

## 2016-10-12 DIAGNOSIS — R079 Chest pain, unspecified: Secondary | ICD-10-CM | POA: Insufficient documentation

## 2016-10-15 ENCOUNTER — Ambulatory Visit: Payer: Managed Care, Other (non HMO) | Admitting: Cardiovascular Disease

## 2017-07-15 DIAGNOSIS — E6609 Other obesity due to excess calories: Secondary | ICD-10-CM | POA: Insufficient documentation

## 2017-07-15 DIAGNOSIS — R5382 Chronic fatigue, unspecified: Secondary | ICD-10-CM | POA: Insufficient documentation

## 2018-06-25 ENCOUNTER — Emergency Department (HOSPITAL_BASED_OUTPATIENT_CLINIC_OR_DEPARTMENT_OTHER)
Admission: EM | Admit: 2018-06-25 | Discharge: 2018-06-25 | Disposition: A | Discharge status: Home / Self Care | Payer: BLUE CROSS/BLUE SHIELD | Attending: Emergency Medicine | Admitting: Emergency Medicine

## 2018-06-25 ENCOUNTER — Emergency Department (HOSPITAL_BASED_OUTPATIENT_CLINIC_OR_DEPARTMENT_OTHER): Payer: BLUE CROSS/BLUE SHIELD

## 2018-06-25 ENCOUNTER — Other Ambulatory Visit: Payer: Self-pay

## 2018-06-25 ENCOUNTER — Encounter (HOSPITAL_BASED_OUTPATIENT_CLINIC_OR_DEPARTMENT_OTHER): Payer: Self-pay | Admitting: Emergency Medicine

## 2018-06-25 DIAGNOSIS — Z79899 Other long term (current) drug therapy: Secondary | ICD-10-CM | POA: Insufficient documentation

## 2018-06-25 DIAGNOSIS — Z87891 Personal history of nicotine dependence: Secondary | ICD-10-CM | POA: Insufficient documentation

## 2018-06-25 DIAGNOSIS — M79605 Pain in left leg: Secondary | ICD-10-CM

## 2018-06-25 DIAGNOSIS — R7989 Other specified abnormal findings of blood chemistry: Secondary | ICD-10-CM | POA: Diagnosis not present

## 2018-06-25 DIAGNOSIS — R0602 Shortness of breath: Secondary | ICD-10-CM | POA: Insufficient documentation

## 2018-06-25 LAB — BASIC METABOLIC PANEL
Anion gap: 9 (ref 5–15)
BUN: 13 mg/dL (ref 6–20)
CO2: 26 mmol/L (ref 22–32)
CREATININE: 0.78 mg/dL (ref 0.44–1.00)
Calcium: 9.6 mg/dL (ref 8.9–10.3)
Chloride: 105 mmol/L (ref 98–111)
GFR calc Af Amer: >60 mL/min (ref >60)
Glucose, Bld: 101 mg/dL — ABNORMAL HIGH (ref 70–99)
Potassium: 3.7 mmol/L (ref 3.5–5.1)
Sodium: 140 mmol/L (ref 135–145)

## 2018-06-25 LAB — D-DIMER, QUANTITATIVE (NOT AT ARMC): D DIMER QUANT: 0.52 ug{FEU}/mL — AB (ref 0.00–0.50)

## 2018-06-25 LAB — CBC WITH DIFFERENTIAL/PLATELET
ABS IMMATURE GRANULOCYTES: 0.02 10*3/uL (ref 0.00–0.07)
Basophils Absolute: 0.1 10*3/uL (ref 0.0–0.1)
Basophils Relative: 1 %
EOS PCT: 3 %
Eosinophils Absolute: 0.2 10*3/uL (ref 0.0–0.5)
HCT: 41.9 % (ref 36.0–46.0)
HEMOGLOBIN: 14.2 g/dL (ref 12.0–15.0)
Immature Granulocytes: 0 %
LYMPHS PCT: 25 %
Lymphs Abs: 1.9 10*3/uL (ref 0.7–4.0)
MCH: 29.1 pg (ref 26.0–34.0)
MCHC: 33.9 g/dL (ref 30.0–36.0)
MCV: 85.9 fL (ref 80.0–100.0)
MONO ABS: 0.7 10*3/uL (ref 0.1–1.0)
Monocytes Relative: 9 %
NEUTROS ABS: 4.6 10*3/uL (ref 1.7–7.7)
Neutrophils Relative %: 62 %
Platelets: 279 10*3/uL (ref 150–400)
RBC: 4.88 MIL/uL (ref 3.87–5.11)
RDW: 12.9 % (ref 11.5–15.5)
WBC: 7.4 10*3/uL (ref 4.0–10.5)
nRBC: 0 % (ref 0.0–0.2)

## 2018-06-25 MED ORDER — IOPAMIDOL (ISOVUE-370) INJECTION 76%
100.0000 mL | Freq: Once | INTRAVENOUS | Status: AC | PRN
  Administered 2018-06-25: 61 mL via INTRAVENOUS

## 2018-06-25 MED ORDER — RIVAROXABAN 15 MG PO TABS
15.0000 mg | ORAL_TABLET | Freq: Once | ORAL | Status: AC
  Administered 2018-06-25: 15 mg via ORAL
  Filled 2018-06-25: qty 1

## 2018-06-25 NOTE — ED Provider Notes (Signed)
[facility] EMERGENCY DEPARTMENT Provider Note   CSN: [id number] Arrival date & time: 06/25/18  1343     History   Chief Complaint Chief Complaint  Patient presents with  . Leg Pain    HPI Autumn Rose is a 52 y.o. female who presents today for evaluation of left  leg pain.  She had surgery on her left foot on 06/08/2018, and had her cast removed on Tuesday.  Since Wednesday or Thursday she has had left-sided calf pain.  She reports associated shortness of breath.  She denies any chest pain, no diaphoresis.  She does not have any personal history of blood clots.  She says that initially after surgery she was taking baby aspirin however stopped that after 1 week.  She reports that she has had a "cold" with nasal congestion.  She denies any nausea vomiting or diarrhea.  She denies fevers at home.  Her shortness of breath is not severe, she does not feel like she is wheezing.  HPI  History reviewed. No pertinent past medical history.  Patient Active Problem List   Diagnosis Date Noted  . Chest pain with moderate risk of acute coronary syndrome 09/23/2016  . Abnormal EKG 09/23/2016  . Dyspnea 01/11/2012  . Appendicitis, acute 05/18/2011    Past Surgical History:  Procedure Laterality Date  . ABLATION    . APPENDECTOMY  2012  . BREAST SURGERY    . JOINT REPLACEMENT    . TUBAL LIGATION    . WISDOM TOOTH EXTRACTION  1991     OB History   None      Home Medications    Prior to Admission medications   Medication Sig Start Date End Date Taking? Authorizing Provider  Calcium Carbonate-Vitamin D (CALCIUM 600 + D PO) Take 1 tablet by mouth daily.      [provider]  fish oil-omega-3 fatty acids 1000 MG capsule Take 1 g by mouth daily.      [provider]  Multiple Vitamins-Minerals (MULTIVITAMIN WITH MINERALS) tablet Take 1 tablet by mouth daily.      [provider]  omeprazole (PRILOSEC) 40 MG capsule Take 40 mg by mouth daily.  04/02/14   [provider]    Family History Family History  Problem Relation Age of Onset  . Hypertension Mother   . Hyperlipidemia Father   . Cancer Father   . Non-Hodgkin's lymphoma Father   . Aneurysm Maternal Grandfather   . Lung cancer Paternal Grandmother   . Stroke Paternal Grandfather   . Epilepsy Sister   . Epilepsy Sister     Social History Social History   Tobacco Use  . Smoking status: Former Smoker    Types: Cigarettes    Last attempt to quit: 05/17/1986    Years since quitting: 32.1  . Smokeless tobacco: Never Used  Substance Use Topics  . Alcohol use: Yes    Alcohol/week: 1.0 standard drinks    Types: 1 Glasses of wine per week  . Drug use: No     Allergies   Patient has no known allergies.   Review of Systems Review of Systems  Constitutional: Negative for diaphoresis and fever.  HENT: Positive for congestion, postnasal drip and rhinorrhea.   Respiratory: Positive for cough and shortness of breath. Negative for chest tightness and wheezing.   Cardiovascular: Negative for chest pain.  Gastrointestinal: Negative for abdominal pain, diarrhea, nausea and vomiting.  Musculoskeletal:       Left calf pain  Skin: Negative for color change, rash and wound.  Neurological: Negative for weakness and numbness.  All other systems reviewed and are negative.    Physical Exam Updated Vital Signs BP 113/76 (BP Location: Left Arm)   Pulse 72   Temp 98.7 F (37.1 C) (Oral)   Resp 16   Ht 5\' 6"  (1.676 m)   Wt 79.4 kg   SpO2 99%   BMI 28.25 kg/m   Physical Exam  Constitutional: She appears well-developed and well-nourished. No distress.  HENT:  Head: Normocephalic and atraumatic.  Nose: Rhinorrhea present.  Eyes: Conjunctivae are normal. Right eye exhibits no discharge. Left eye exhibits no discharge. No scleral icterus.  Neck: Normal range of motion.  Cardiovascular: Normal rate, regular rhythm, normal heart sounds and intact distal pulses.    No murmur heard. Pulmonary/Chest: Effort normal and breath sounds normal. No accessory muscle usage or stridor. No tachypnea. No respiratory distress. She has no decreased breath sounds. She has no wheezes. She exhibits no tenderness.  Abdominal: She exhibits no distension.  Musculoskeletal: She exhibits no edema or deformity.  TTP along left posterior lower leg/proximal calf.  No chords palpated.  No induration or abnormal warmth.  No crepitus.    Neurological: She is alert. She exhibits normal muscle tone.  Skin: Skin is warm and dry. She is not diaphoretic.  Surgical site on left foot is CDI.  No wounds over area of pain on left lower leg. Mild ecchymosis on bilateral knees that appears old (yellow)   Psychiatric: She has a normal mood and affect. Her behavior is normal.  Nursing note and vitals reviewed.    ED Treatments / Results  Labs (all labs ordered are listed, but only abnormal results are displayed) Labs Reviewed  BASIC METABOLIC PANEL - Abnormal; Notable for the following components:      Result Value   Glucose, Bld 101 (*)    All other components within normal limits  D-DIMER, QUANTITATIVE (NOT AT [facility]) - Abnormal; Notable for the following components:   D-Dimer, Quant 0.52 (*)    All other components within normal limits  CBC WITH DIFFERENTIAL/PLATELET    EKG EKG Interpretation  Date/Time:  Sunday June 25 2018 15:18:07 EST Ventricular Rate:  71 PR Interval:    QRS Duration: 89 QT Interval:  384 QTC Calculation: 418 R Axis:   10 Text Interpretation:  Sinus rhythm Borderline short PR interval Low voltage, precordial leads Borderline abnrm T, anterolateral leads similar to  previous Confirmed by Frederick Peers [id number]) on 06/25/2018 3:41:32 PM   Radiology Dg Chest 2 View  Result Date: 06/25/2018 CLINICAL DATA:  Shortness of breath for 1 week EXAM: CHEST - 2 VIEW COMPARISON:  09/17/2016 FINDINGS: The heart size and mediastinal contours are within normal  limits. Both lungs are clear. The visualized skeletal structures are unremarkable. IMPRESSION: No acute abnormality noted. Electronically Signed   By: Alcide Clever M.D.   On: 06/25/2018 15:34   Ct Angio Chest Pe W/cm &/or Wo Cm  Result Date: 06/25/2018 CLINICAL DATA:  Short of breath elevated D-dimer EXAM: CT ANGIOGRAPHY CHEST WITH CONTRAST TECHNIQUE: Multidetector CT imaging of the chest was performed using the standard protocol during bolus administration of intravenous contrast. Multiplanar CT image reconstructions and MIPs were obtained to evaluate the vascular anatomy. CONTRAST:  61mL ISOVUE-370 IOPAMIDOL (ISOVUE-370) INJECTION 76% COMPARISON:  Chest x-ray 06/27/2018, CT 01/11/2012 FINDINGS: Cardiovascular: Satisfactory opacification of the pulmonary arteries to the segmental level. No evidence of pulmonary embolism. Nonaneurysmal aorta. Minimal  aortic atherosclerosis. Mediastinum/Nodes: No enlarged mediastinal, hilar, or axillary lymph nodes. Thyroid gland, trachea, and esophagus demonstrate no significant findings. Lungs/Pleura: Lungs are clear. No pleural effusion or pneumothorax. Upper Abdomen: No acute abnormality. Musculoskeletal: No chest wall abnormality. No acute or significant osseous findings. Bilateral breast implants. Review of the MIP images confirms the above findings. IMPRESSION: Negative for acute pulmonary embolus. Clear lung fields. Aortic Atherosclerosis (ICD10-I70.0). Electronically Signed   By: Jasmine Pang M.D.   On: 06/25/2018 16:54    Procedures Procedures (including critical care time)  Medications Ordered in ED Medications  iopamidol (ISOVUE-370) 76 % injection 100 mL (61 mLs Intravenous Contrast Given 06/25/18 1609)  Rivaroxaban (XARELTO) tablet 15 mg (15 mg Oral Given 06/25/18 1758)     Initial Impression / Assessment and Plan / ED Course  I have reviewed the triage vital signs and the nursing notes.  Pertinent labs & imaging results that were available during  my care of the patient were reviewed by me and considered in my medical decision making (see chart for details).  Clinical Course as of Jun 25 2210  Wynelle Link Jun 25, 2018  1546 Patient reevaluated, no questions or concerns.  Informed of positive d-dimer and plan for CT angios chest PE study.  She states she has had IV contrast before without adverse reaction.   [EH]  1659 No Pe.   CT Angio Chest PE W/Cm &/Or Wo Cm [EH]    Clinical Course User Index [EH] Cristina Gong, PA-C   Patient presents today for evaluation of left leg pain.  She is recently postop from a left foot surgery where she had immobilization.  Given history of recent immobilization in the setting of calf pain concern for blood clot.  Ultrasound is not currently available.  Patient also reported shortness of breath, raising concern for PE from DVT.  Labs were obtained and reviewed, dimer is slightly elevated at 0.52.  She does not have a significant leukocytosis.  EKG was obtained without evidence of acute abnormalities.  Given absence of chest pain troponin was not obtained, as her shortness of breath is not consistent with ACS.  She is not anemic, chest x-ray does not show any evidence of acute abnormalities.  Given positive d-dimer and shortness of breath in the setting of suspected lower extremity DVT CT angios PE study was performed which did not show any evidence of pulmonary emboli or other acute abnormalities.  Patient was treated with a one-time dose of Xarelto, and orders were placed for an outpatient DVT study as ultrasound is not available for this here today.  Patient was counseled on the risks and benefits of Xarelto versus untreated venous thrombosis.  We discussed the importance of seeking medical care for any trauma especially head trauma while on the Xarelto.  This patient was seen as a shared visit with Dr. Clarice Pole.  Return precautions were discussed with patient who states their understanding.  At the time of  discharge patient denied any unaddressed complaints or concerns.  Patient is agreeable for discharge home.   Final Clinical Impressions(s) / ED Diagnoses   Final diagnoses:  Left leg pain  Positive D dimer    ED Discharge Orders         Ordered    VAS [place] LOWER EXTREMITY VENOUS (DVT)  Status:  Canceled     06/25/18 1753    VAS [place] LOWER EXTREMITY VENOUS (DVT)     06/25/18 1829  Cristina Gong, PA-C 06/25/18 2219    Arby Barrette, MD 06/28/18 [phone]

## 2018-06-25 NOTE — ED Triage Notes (Signed)
Pt c/o left calf pain onset last Monday. Pt had foot surgery 06/08/2018 and had cast removed Tuesday.

## 2018-06-25 NOTE — Discharge Instructions (Addendum)
Today you were given a blood thinning pill.  If you have any trauma or striking her head during the next 12 to 24 hours you need to seek medical care and evaluation.  I have placed orders for you to have a ultrasound performed on your left leg.  Please come to the emergency room at 8:30 AM tomorrow and tell them that you are scheduled for a vascular ultrasound, and that you were seen in the emergency room yesterday for this.  If this is unavailable at [facility] then go to [facility] in [place] for this scan.    Your scan on your chest did not show a blood clot in your lungs.  Please follow-up with your primary care doctor.

## 2018-06-26 ENCOUNTER — Ambulatory Visit (HOSPITAL_BASED_OUTPATIENT_CLINIC_OR_DEPARTMENT_OTHER)
Admission: RE | Admit: 2018-06-26 | Discharge: 2018-06-26 | Disposition: A | Discharge status: Home / Self Care | Payer: BLUE CROSS/BLUE SHIELD | Source: Ambulatory Visit | Attending: Physician Assistant | Admitting: Physician Assistant

## 2018-06-26 DIAGNOSIS — R791 Abnormal coagulation profile: Secondary | ICD-10-CM | POA: Insufficient documentation

## 2018-06-26 NOTE — ED Provider Notes (Signed)
11:08 AM Patient returns for DVT ultrasound.  Next  DVT also was negative.  Patient was informed of this finding.  She reports no other complaints and is feeling better.  Patient and family understood return precautions of all follow-up instructions.  Patient no other questions or concerns and was discharged in good condition.   Tegeler, Canary Brimhristopher J, MD 06/26/18 [phone]

## 2018-07-05 IMAGING — CR DG CHEST 2V
2 series · 2 of 2 positions shown · non-contrast
Comparison: 01/11/2012 chest CT angiogram.

CLINICAL DATA: Chest pain

EXAM:
CHEST  2 VIEW

[w chest pa]
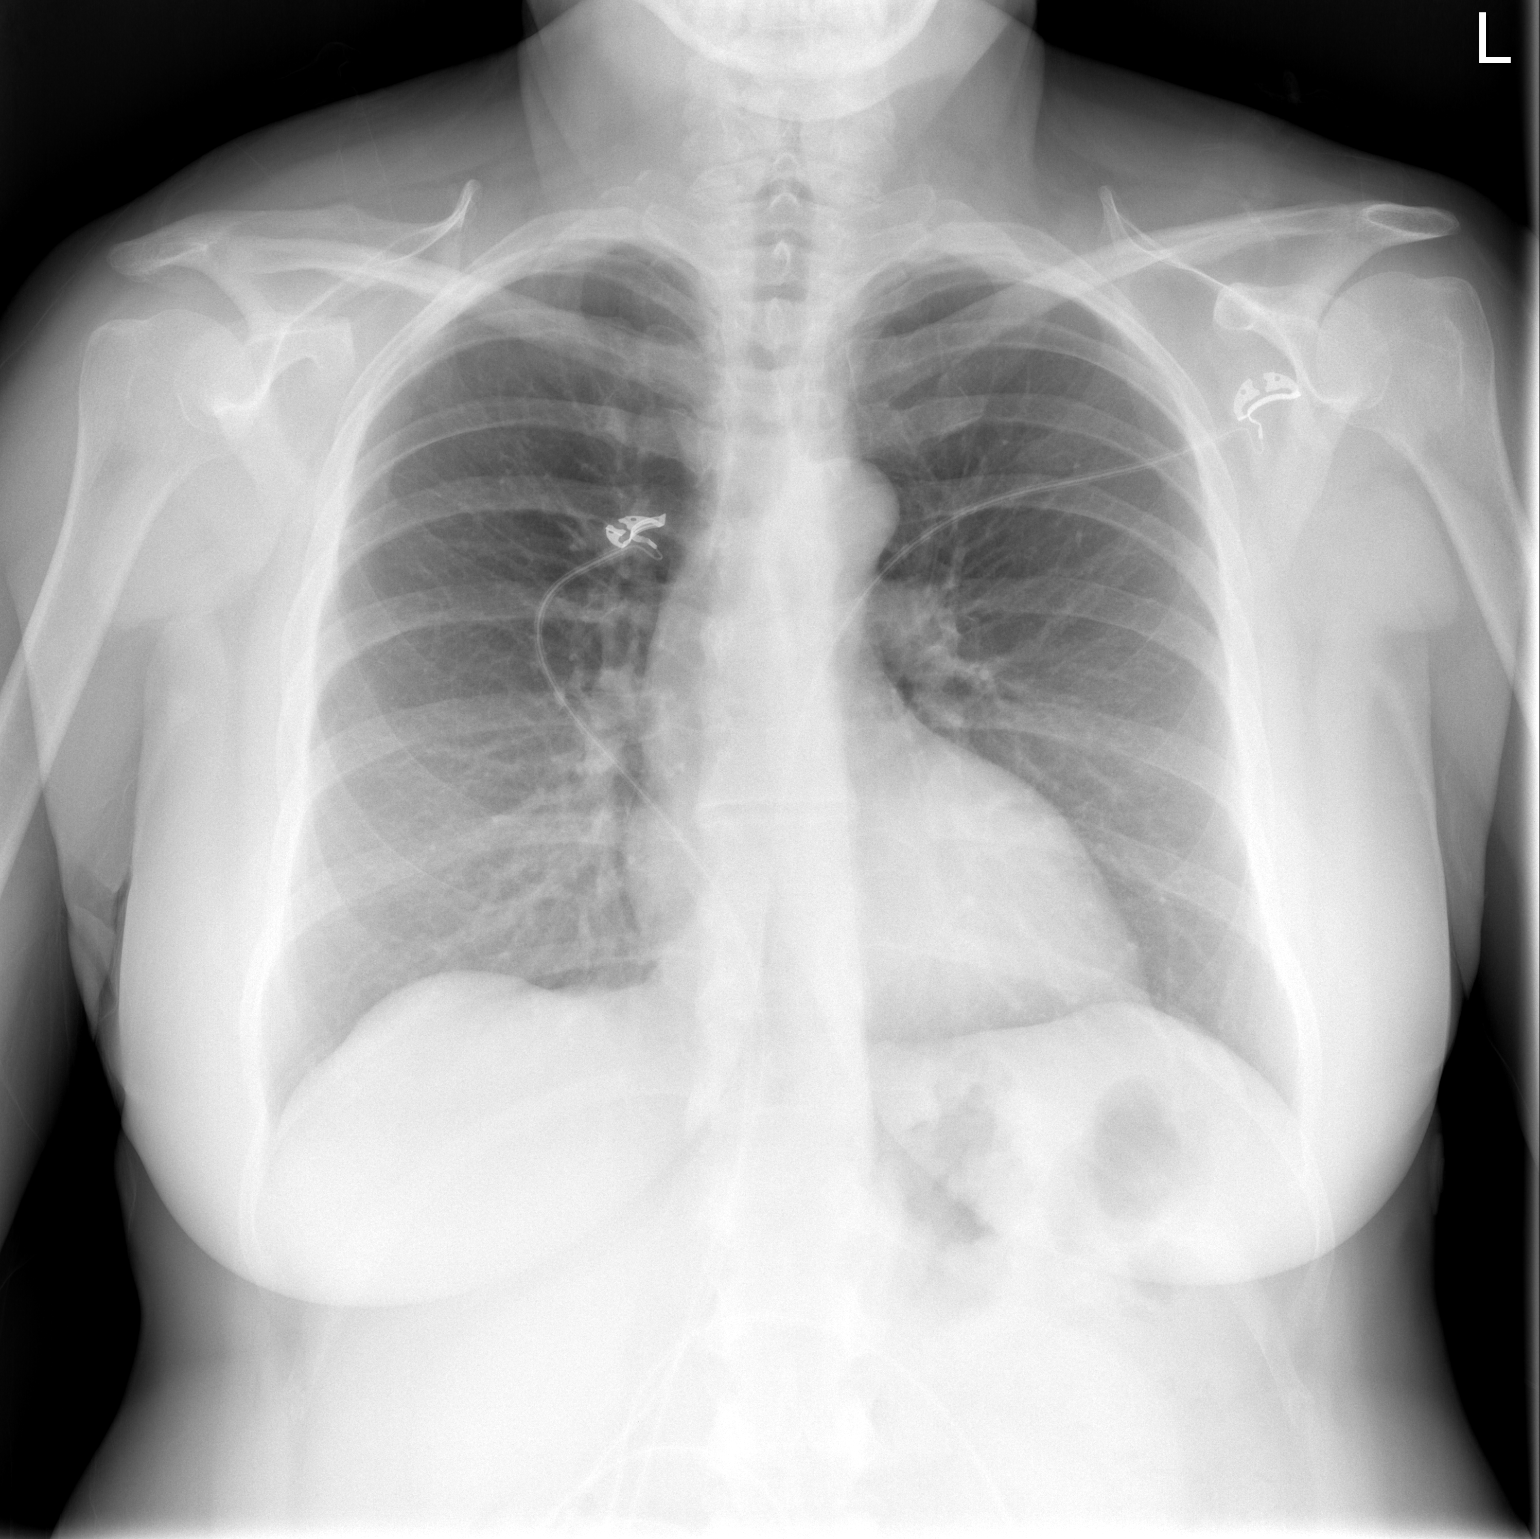

[w chest lat]
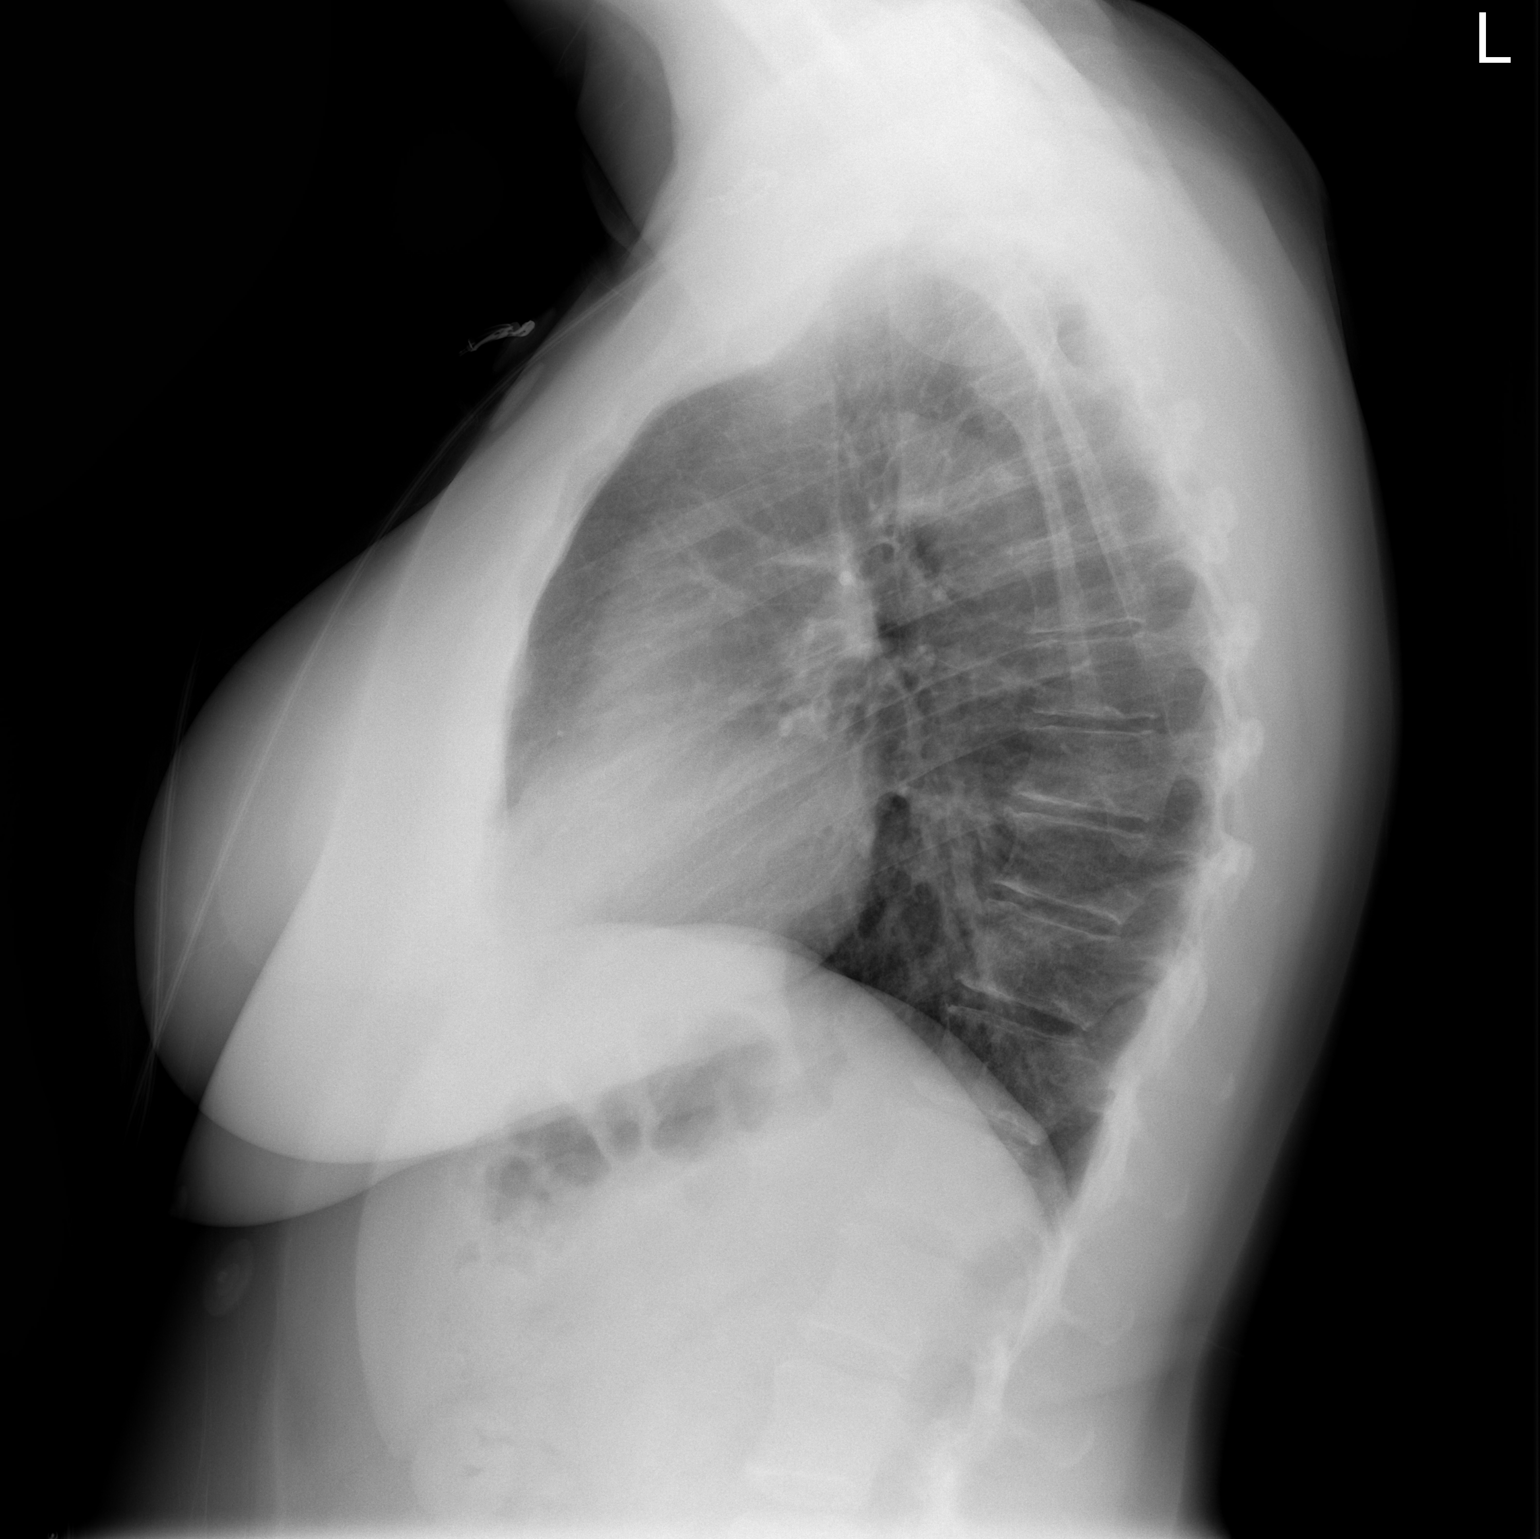

[2 of 2 positions shown; findings below may reference images not displayed]

FINDINGS: Normal heart size. Normal mediastinal contour. No pneumothorax. No
pleural effusion. Lungs appear clear, with no acute consolidative
airspace disease and no pulmonary edema.
IMPRESSION: No active cardiopulmonary disease.

## 2018-07-25 DIAGNOSIS — M792 Neuralgia and neuritis, unspecified: Secondary | ICD-10-CM | POA: Insufficient documentation

## 2019-06-04 DIAGNOSIS — G43009 Migraine without aura, not intractable, without status migrainosus: Secondary | ICD-10-CM | POA: Insufficient documentation

## 2019-08-10 HISTORY — PX: BILATERAL CARPAL TUNNEL RELEASE: SHX6508

## 2019-11-23 DIAGNOSIS — G2581 Restless legs syndrome: Secondary | ICD-10-CM | POA: Insufficient documentation

## 2019-12-18 ENCOUNTER — Encounter (HOSPITAL_BASED_OUTPATIENT_CLINIC_OR_DEPARTMENT_OTHER): Payer: Self-pay

## 2019-12-18 ENCOUNTER — Other Ambulatory Visit: Payer: Self-pay

## 2019-12-18 ENCOUNTER — Emergency Department (HOSPITAL_BASED_OUTPATIENT_CLINIC_OR_DEPARTMENT_OTHER): Payer: BC Managed Care – PPO

## 2019-12-18 ENCOUNTER — Emergency Department (HOSPITAL_BASED_OUTPATIENT_CLINIC_OR_DEPARTMENT_OTHER)
Admission: EM | Admit: 2019-12-18 | Discharge: 2019-12-18 | Disposition: A | Discharge status: Home / Self Care | Payer: BC Managed Care – PPO | Attending: Emergency Medicine | Admitting: Emergency Medicine

## 2019-12-18 DIAGNOSIS — Z79899 Other long term (current) drug therapy: Secondary | ICD-10-CM | POA: Diagnosis not present

## 2019-12-18 DIAGNOSIS — R0789 Other chest pain: Secondary | ICD-10-CM | POA: Insufficient documentation

## 2019-12-18 DIAGNOSIS — Z885 Allergy status to narcotic agent status: Secondary | ICD-10-CM | POA: Diagnosis not present

## 2019-12-18 DIAGNOSIS — Z87891 Personal history of nicotine dependence: Secondary | ICD-10-CM | POA: Insufficient documentation

## 2019-12-18 DIAGNOSIS — R079 Chest pain, unspecified: Secondary | ICD-10-CM

## 2019-12-18 LAB — CBC WITH DIFFERENTIAL/PLATELET
Abs Immature Granulocytes: 0.01 10*3/uL (ref 0.00–0.07)
Basophils Absolute: 0.1 10*3/uL (ref 0.0–0.1)
Basophils Relative: 1 %
Eosinophils Absolute: 0 10*3/uL (ref 0.0–0.5)
Eosinophils Relative: 1 %
HCT: 42.4 % (ref 36.0–46.0)
Hemoglobin: 14.7 g/dL (ref 12.0–15.0)
Immature Granulocytes: 0 %
Lymphocytes Relative: 18 %
Lymphs Abs: 1.4 10*3/uL (ref 0.7–4.0)
MCH: 29.5 pg (ref 26.0–34.0)
MCHC: 34.7 g/dL (ref 30.0–36.0)
MCV: 85 fL (ref 80.0–100.0)
Monocytes Absolute: 0.5 10*3/uL (ref 0.1–1.0)
Monocytes Relative: 7 %
Neutro Abs: 5.7 10*3/uL (ref 1.7–7.7)
Neutrophils Relative %: 73 %
Platelets: 309 10*3/uL (ref 150–400)
RBC: 4.99 MIL/uL (ref 3.87–5.11)
RDW: 13.1 % (ref 11.5–15.5)
WBC: 7.7 10*3/uL (ref 4.0–10.5)
nRBC: 0 % (ref 0.0–0.2)

## 2019-12-18 LAB — COMPREHENSIVE METABOLIC PANEL
ALT: 59 U/L — ABNORMAL HIGH (ref 0–44)
AST: 41 U/L (ref 15–41)
Albumin: 4.1 g/dL (ref 3.5–5.0)
Alkaline Phosphatase: 89 U/L (ref 38–126)
Anion gap: 11 (ref 5–15)
BUN: 15 mg/dL (ref 6–20)
CO2: 23 mmol/L (ref 22–32)
Calcium: 9.4 mg/dL (ref 8.9–10.3)
Chloride: 103 mmol/L (ref 98–111)
Creatinine, Ser: 0.79 mg/dL (ref 0.44–1.00)
GFR calc Af Amer: >60 mL/min (ref >60)
GFR calc non Af Amer: >60 mL/min (ref >60)
Glucose, Bld: 103 mg/dL — ABNORMAL HIGH (ref 70–99)
Potassium: 3.7 mmol/L (ref 3.5–5.1)
Sodium: 137 mmol/L (ref 135–145)
Total Bilirubin: 0.5 mg/dL (ref 0.3–1.2)
Total Protein: 7.7 g/dL (ref 6.5–8.1)

## 2019-12-18 LAB — D-DIMER, QUANTITATIVE: D-Dimer, Quant: 0.41 ug/mL-FEU (ref 0.00–0.50)

## 2019-12-18 LAB — TROPONIN I (HIGH SENSITIVITY)
Troponin I (High Sensitivity): 2 ng/L (ref <18)
Troponin I (High Sensitivity): 2 ng/L (ref <18)

## 2019-12-18 MED ORDER — ACETAMINOPHEN 325 MG PO TABS
650.0000 mg | ORAL_TABLET | Freq: Once | ORAL | Status: AC
  Administered 2019-12-18: 650 mg via ORAL
  Filled 2019-12-18: qty 2

## 2019-12-18 NOTE — ED Notes (Signed)
Pt on monitor 

## 2019-12-18 NOTE — ED Provider Notes (Signed)
[facility] EMERGENCY DEPARTMENT Provider Note   CSN: [id number] Arrival date & time: 12/18/19  1111     History Chief Complaint  Patient presents with  . Chest Pain    Autumn Rose is a 54 y.o. female.  The history is provided by the patient. No language interpreter was used.  Chest Pain Pain location:  L chest Pain severity:  Moderate Onset quality:  Gradual Duration:  1 day Timing:  Constant Progression:  Worsening Chronicity:  New Relieved by:  Nothing Worsened by:  Nothing Ineffective treatments:  None tried Associated symptoms: no abdominal pain   Risk factors: no high cholesterol and no hypertension   Pt complains of some left sided chest tightness.and some shortness of breath       History reviewed. No pertinent past medical history.  Patient Active Problem List   Diagnosis Date Noted  . Chest pain with moderate risk of acute coronary syndrome 09/23/2016  . Abnormal EKG 09/23/2016  . Dyspnea 01/11/2012  . Appendicitis, acute 05/18/2011    Past Surgical History:  Procedure Laterality Date  . ABLATION    . APPENDECTOMY  2012  . BREAST IMPLANT REMOVAL    . BREAST SURGERY    . FOOT SURGERY    . TUBAL LIGATION    . WISDOM TOOTH EXTRACTION  1991     OB History   No obstetric history on file.     Family History  Problem Relation Age of Onset  . Hypertension Mother   . Hyperlipidemia Father   . Cancer Father   . Non-Hodgkin's lymphoma Father   . Aneurysm Maternal Grandfather   . Lung cancer Paternal Grandmother   . Stroke Paternal Grandfather   . Epilepsy Sister   . Epilepsy Sister     Social History   Tobacco Use  . Smoking status: Former Smoker    Types: Cigarettes    Quit date: 05/17/1986    Years since quitting: 33.6  . Smokeless tobacco: Never Used  Substance Use Topics  . Alcohol use: Yes    Comment: occ  . Drug use: No    Home Medications Prior to Admission medications   Medication Sig Start Date End Date  Taking? Authorizing Provider  sertraline (ZOLOFT) 100 MG tablet Take by mouth. 11/23/19 11/22/20 Yes [provider]  SUMAtriptan (IMITREX) 100 MG tablet Take 1 tablet daily as needed for migraine.  Maximum 200 mg/day. 01/24/17  Yes [provider]  Calcium Carbonate-Vitamin D (CALCIUM 600 + D PO) Take 1 tablet by mouth daily.      [provider]  fish oil-omega-3 fatty acids 1000 MG capsule Take 1 g by mouth daily.      [provider]  Multiple Vitamins-Minerals (MULTIVITAMIN WITH MINERALS) tablet Take 1 tablet by mouth daily.      [provider]  omeprazole (PRILOSEC) 40 MG capsule Take 40 mg by mouth daily. 04/02/14   [provider]    Allergies    Oxycodone  Review of Systems   Review of Systems  Cardiovascular: Positive for chest pain.  Gastrointestinal: Negative for abdominal pain.  All other systems reviewed and are negative.   Physical Exam Updated Vital Signs BP 124/81 (BP Location: Left Arm) Comment: Simultaneous filing. User may not have seen previous data. Comment (BP Location): Simultaneous filing. User may not have seen previous data.  Pulse 99 Comment: Simultaneous filing. User may not have seen previous data.  Temp 98.3 F (36.8 C) (Oral)  Resp 18 Comment: Simultaneous filing. User may not have seen previous data.  Ht 5\' 5"  (1.651 m)   Wt 78.5 kg   SpO2 99% Comment: Simultaneous filing. User may not have seen previous data.  BMI 28.79 kg/m   Physical Exam Vitals and nursing note reviewed.  Constitutional:      Appearance: She is well-developed.  HENT:     Head: Normocephalic.  Cardiovascular:     Heart sounds: Normal heart sounds.  Pulmonary:     Effort: Pulmonary effort is normal.  Chest:     Chest wall: No tenderness or edema.  Abdominal:     General: There is no distension.     Palpations: Abdomen is soft.  Musculoskeletal:        General: Normal range of motion.     Cervical back: Normal range  of motion.  Skin:    General: Skin is warm.  Neurological:     General: No focal deficit present.     Mental Status: She is alert and oriented to person, place, and time.     ED Results / Procedures / Treatments   Labs (all labs ordered are listed, but only abnormal results are displayed) Labs Reviewed  COMPREHENSIVE METABOLIC PANEL - Abnormal; Notable for the following components:      Result Value   Glucose, Bld 103 (*)    ALT 59 (*)    All other components within normal limits  CBC WITH DIFFERENTIAL/PLATELET  D-DIMER, QUANTITATIVE (NOT AT [facility])  TROPONIN I (HIGH SENSITIVITY)  TROPONIN I (HIGH SENSITIVITY)    EKG EKG Interpretation  Date/Time:  Tuesday Dec 18 2019 11:25:55 EDT Ventricular Rate:  83 PR Interval:    QRS Duration: 74 QT Interval:  496 QTC Calculation: 583 R Axis:   20 Text Interpretation: Sinus rhythm Low voltage, extremity and precordial leads Prolonged QT interval Baseline wander in lead(s) V3 V4 V5 No significant change since last tracing Confirmed by 07-[birth year]1970 [phone]) on 12/18/2019 11:29:21 AM   Radiology DG Chest Port 1 View  Result Date: 12/18/2019 CLINICAL DATA:  Chest pain for 3 days EXAM: PORTABLE CHEST 1 VIEW COMPARISON:  06/25/2018 FINDINGS: The heart size and mediastinal contours are within normal limits. No focal airspace consolidation, pleural effusion, or pneumothorax. The visualized skeletal structures are unremarkable. IMPRESSION: No active disease. Electronically Signed   By: 06/27/2018 D.O.   On: 12/18/2019 12:34    Procedures Procedures (including critical care time)  Medications Ordered in ED Medications  acetaminophen (TYLENOL) tablet 650 mg (650 mg Oral Given 12/18/19 1420)    ED Course  I have reviewed the triage vital signs and the nursing notes.  Pertinent labs & imaging results that were available during my care of the patient were reviewed by me and considered in my medical decision making (see chart for  details).    MDM Rules/Calculators/A&P                      MDM: Ddimer is negative troponin is negative x 2.  Pt advised to see Dr. 02/17/20 for recheck.  Pt currently pain free.   Final Clinical Impression(s) / ED Diagnoses Final diagnoses:  Nonspecific chest pain    Rx / DC Orders ED Discharge Orders    None       Tresa Endo 12/18/19 1743    Little, 02/17/20, MD 12/22/19 [phone]

## 2019-12-18 NOTE — Discharge Instructions (Signed)
See your Physician for recheck.  Return if any problems.  °

## 2019-12-18 NOTE — ED Notes (Signed)
ED Provider at bedside.-PA Student 

## 2019-12-18 NOTE — ED Triage Notes (Signed)
Pt c/o CP x 3 days-NAD-steady gait 

## 2020-05-26 DIAGNOSIS — H2513 Age-related nuclear cataract, bilateral: Secondary | ICD-10-CM | POA: Insufficient documentation

## 2020-05-26 DIAGNOSIS — H02831 Dermatochalasis of right upper eyelid: Secondary | ICD-10-CM | POA: Insufficient documentation

## 2020-05-26 DIAGNOSIS — H5213 Myopia, bilateral: Secondary | ICD-10-CM | POA: Insufficient documentation

## 2020-06-17 DIAGNOSIS — M4317 Spondylolisthesis, lumbosacral region: Secondary | ICD-10-CM | POA: Insufficient documentation

## 2021-08-27 ENCOUNTER — Telehealth: Payer: Self-pay | Admitting: Surgery

## 2021-08-27 ENCOUNTER — Ambulatory Visit: Payer: Self-pay

## 2021-08-27 ENCOUNTER — Other Ambulatory Visit: Payer: Self-pay

## 2021-08-27 ENCOUNTER — Ambulatory Visit (INDEPENDENT_AMBULATORY_CARE_PROVIDER_SITE_OTHER): Payer: BC Managed Care – PPO | Admitting: Surgery

## 2021-08-27 ENCOUNTER — Encounter: Payer: Self-pay | Admitting: Surgery

## 2021-08-27 VITALS — BP 126/89 | HR 97 | Ht 65.0 in | Wt 173.0 lb

## 2021-08-27 DIAGNOSIS — R29898 Other symptoms and signs involving the musculoskeletal system: Secondary | ICD-10-CM | POA: Diagnosis not present

## 2021-08-27 DIAGNOSIS — M5416 Radiculopathy, lumbar region: Secondary | ICD-10-CM

## 2021-08-27 DIAGNOSIS — M4316 Spondylolisthesis, lumbar region: Secondary | ICD-10-CM

## 2021-08-27 NOTE — Telephone Encounter (Signed)
Pt called. States she is suppose to be getting a referral to go see another doctor? She states "April is suppose to be calling her with an appt."  CB [phone]

## 2021-08-27 NOTE — Telephone Encounter (Signed)
Talked with patient concerning appointment for Dr. Louanne Skye.  Appointment scheduled.

## 2021-08-31 ENCOUNTER — Other Ambulatory Visit: Payer: Self-pay

## 2021-08-31 ENCOUNTER — Encounter: Payer: Self-pay | Admitting: Specialist

## 2021-08-31 ENCOUNTER — Ambulatory Visit (INDEPENDENT_AMBULATORY_CARE_PROVIDER_SITE_OTHER): Payer: BC Managed Care – PPO | Admitting: Specialist

## 2021-08-31 VITALS — BP 125/84 | HR 98 | Ht 65.0 in | Wt 173.0 lb

## 2021-08-31 DIAGNOSIS — M5416 Radiculopathy, lumbar region: Secondary | ICD-10-CM | POA: Diagnosis not present

## 2021-08-31 DIAGNOSIS — M4316 Spondylolisthesis, lumbar region: Secondary | ICD-10-CM

## 2021-08-31 DIAGNOSIS — R29898 Other symptoms and signs involving the musculoskeletal system: Secondary | ICD-10-CM

## 2021-08-31 NOTE — Progress Notes (Signed)
Office Visit Note   Patient: Autumn Rose           Date of Birth: [birth year]           MRN: [id number] Visit Date: 08/27/2021              Requested by: Wilburn Mylar, MD No address on file PCP: Wilburn Mylar, MD   Assessment & Plan: Visit Diagnoses:  1. Spondylolisthesis, lumbar region   2. Radiculopathy, lumbar region   3. Weakness of right lower extremity     Plan: I reviewed patient's x-rays with her that were obtained today.  She does have an unstable L5-S1 spondylolisthesis That does move several millimeters with flexion and extension.  Advised that I am also concerned that she continues to have ongoing right lower extremity radiculopathy and feeling of leg weakness that is failed conservative treatment.  She also does have right gastroc weakness on exam.  I discussed this with my attending Dr. Otelia Sergeant.  I will get patient an appointment to see him early next week to see if surgical intervention with L5-S1 fusion is indicated.  I advised patient that with everything we discussed today that that may end up being the next step.  She voices understanding.  Follow-Up Instructions: Return for With Dr. Otelia Sergeant next week to discuss possible surgery with lumbar fusion.   Orders:  Orders Placed This Encounter  Procedures   XR Lumbar Spine Complete   No orders of the defined types were placed in this encounter.     Procedures: No procedures performed   Clinical Data: No additional findings.   Subjective: Chief Complaint  Patient presents with   Right Leg - Pain    HPI 56 year old white female who is new patient to clinic comes in today with complaints of worsening right lower extremity radiculopathy.  History of back pain and known per her report L5-S1 spondylolisthesis.  Patient states that she was told as a teenager that she had L5-S1 spondylolisthesis.  Over the years she has had off-and-on issues with back pain.  This is recently been worse over the last few  months.  States that she is not having so much back pain at this time but has been having ongoing issues with radicular pain below her knee down to the foot.  No symptoms on the left side.  Not describing any true symptoms with neurogenic claudication.  Right lower leg pain is constant.  She also has had some feeling of right lower extremity weakness over the last couple months.  No complaints of bowel or bladder incontinence.  She seen by her primary care provider June 29, 2021 for this problem.  She is prescribed oral prednisone, Flexeril.  Did not have any improvement with this.  She was then seen at the urgent care at that office August 07, 2021 and lumbar MRI scan was ordered.  Study from August 20, 2021 showed:  CLINICAL DATA:  Low back pain, right-sided, with radiation into the  right buttock   EXAM:  MRI LUMBAR SPINE WITHOUT CONTRAST   TECHNIQUE:  Multiplanar, multisequence MR imaging of the lumbar spine was  performed. No intravenous contrast was administered.   COMPARISON:  No prior MRI, correlation is made with L-spine  radiographs 08/07/2021   FINDINGS:  Segmentation:  Standard.   Alignment: Grade 1 anterolisthesis of L5 on S1, with bilateral pars  defects, as seen on the prior radiographs. No other listhesis.   Vertebrae: Bilateral L5 pars defects.  No other acute fracture or  suspicious osseous lesion. Decreased T1 and increased T2 signal at  the L5-S1 endplates, likely edema.   Conus medullaris and cauda equina: Conus extends to the L1-L2 level.  Conus and cauda equina appear normal.   Paraspinal and other soft tissues: Negative.   Disc levels:   T12-L1: Seen only on the sagittal images. No significant disc bulge,  spinal canal stenosis, or neural foraminal narrowing.   L1-L2: No significant disc bulge. No spinal canal stenosis or neural  foraminal narrowing.   L2-L3: No significant disc bulge. Mild facet arthropathy. No spinal  canal stenosis or neural  foraminal narrowing.   L3-L4: No significant disc bulge. Mild facet arthropathy. No spinal  canal stenosis or neural foraminal narrowing.   L4-L5: No significant disc bulge. Mild facet arthropathy. No spinal  canal stenosis or neural foraminal narrowing.   L5-S1: Grade 1 anterolisthesis with disc unroofing. Bilateral pars  defects with otherwise mild facet arthropathy. No spinal canal  stenosis. Mild-to-moderate bilateral neural foraminal narrowing.   IMPRESSION:  L5-S1 grade 1 anterolisthesis, secondary to bilateral L5 pars  defects, with mild facet arthropathy, which results in  mild-to-moderate bilateral neural foraminal narrowing.    Electronically Signed    By: Wiliam KeAlison  Vasan M.D.    On: 08/20/2021 18:50 Procedure Note  Drue StagerVasan, Alison Dawn, MD - 08/20/2021  Formatting of this note might be different from the original.  CLINICAL DATA:  Low back pain, right-sided, with radiation into the  right buttock   EXAM:  MRI LUMBAR SPINE WITHOUT CONTRAST   TECHNIQUE:  Multiplanar, multisequence MR imaging of the lumbar spine was  performed. No intravenous contrast was administered.   COMPARISON:  No prior MRI, correlation is made with L-spine  radiographs 08/07/2021   FINDINGS:  Segmentation:  Standard.   Alignment: Grade 1 anterolisthesis of L5 on S1, with bilateral pars  defects, as seen on the prior radiographs. No other listhesis.   Vertebrae: Bilateral L5 pars defects. No other acute fracture or  suspicious osseous lesion. Decreased T1 and increased T2 signal at  the L5-S1 endplates, likely edema.   Conus medullaris and cauda equina: Conus extends to the L1-L2 level.  Conus and cauda equina appear normal.   Paraspinal and other soft tissues: Negative.   Disc levels:   T12-L1: Seen only on the sagittal images. No significant disc bulge,  spinal canal stenosis, or neural foraminal narrowing.   L1-L2: No significant disc bulge. No spinal canal stenosis or neural   foraminal narrowing.   L2-L3: No significant disc bulge. Mild facet arthropathy. No spinal  canal stenosis or neural foraminal narrowing.   L3-L4: No significant disc bulge. Mild facet arthropathy. No spinal  canal stenosis or neural foraminal narrowing.   L4-L5: No significant disc bulge. Mild facet arthropathy. No spinal  canal stenosis or neural foraminal narrowing.   L5-S1: Grade 1 anterolisthesis with disc unroofing. Bilateral pars  defects with otherwise mild facet arthropathy. No spinal canal  stenosis. Mild-to-moderate bilateral neural foraminal narrowing.   IMPRESSION:  L5-S1 grade 1 anterolisthesis, secondary to bilateral L5 pars  defects, with mild facet arthropathy, which results in  mild-to-moderate bilateral neural foraminal narrowing.    Electronically Signed    By: Wiliam KeAlison  Vasan M.D.    On: 08/20/2021 18:50   Patient comes in today to discuss her options.   Review of Systems No current cardiac pulmonary GI GU issues  Objective: Vital Signs: BP 126/89    Pulse 97  Ht 5\' 5"  (1.651 m)    Wt 173 lb (78.5 kg)    BMI 28.79 kg/m   Physical Exam Constitutional:      Appearance: Normal appearance.  HENT:     Head: Normocephalic and atraumatic.     Nose: Nose normal.  Eyes:     Extraocular Movements: Extraocular movements intact.  Pulmonary:     Effort: No respiratory distress.  Musculoskeletal:     Comments: Very pleasant white female alert and oriented in no acute distress.  Gait is somewhat antalgic.  Positive right-sided lumbar paraspinal tenderness.  Positive right greater left-sided notch tenderness.  Negative logroll bilateral hips.  Positive right straight leg raise.  Bilateral knee and ankle deep tendon reflexes intact.  Bilateral calves nontender.  Patient does have some right gastroc weakness when compared to the left.  All other strength maneuvers intact.    Neurological:     Mental Status: She is alert and oriented to person, place, and time.   Psychiatric:        Mood and Affect: Mood normal.    Ortho Exam  Specialty Comments:  No specialty comments available.  Imaging: No results found.   PMFS History: Patient Active Problem List   Diagnosis Date Noted   Chest pain with moderate risk of acute coronary syndrome 09/23/2016   Abnormal EKG 09/23/2016   Dyspnea 01/11/2012   Appendicitis, acute 05/18/2011   History reviewed. No pertinent past medical history.  Family History  Problem Relation Age of Onset   Hypertension Mother    Hyperlipidemia Father    Cancer Father    Non-Hodgkin's lymphoma Father    Aneurysm Maternal Grandfather    Lung cancer Paternal Grandmother    Stroke Paternal Grandfather    Epilepsy Sister    Epilepsy Sister     Past Surgical History:  Procedure Laterality Date   ABLATION     APPENDECTOMY  2012   BREAST IMPLANT REMOVAL     BREAST SURGERY     FOOT SURGERY     TUBAL LIGATION     WISDOM TOOTH EXTRACTION  1991   Social History   Occupational History   Not on file  Tobacco Use   Smoking status: Former    Types: Cigarettes    Quit date: 05/17/1986    Years since quitting: 35.3   Smokeless tobacco: Never  Vaping Use   Vaping Use: Never used  Substance and Sexual Activity   Alcohol use: Yes    Comment: occ   Drug use: No   Sexual activity: Not on file

## 2021-08-31 NOTE — Progress Notes (Signed)
Office Visit Note   Patient: Autumn Rose           Date of Birth: Mar 26, 1966           MRN: [id number] Visit Date: 08/31/2021              Requested by: Wilburn Mylar, MD No address on file PCP: Wilburn Mylar, MD   Assessment & Plan: Visit Diagnoses:  1. Spondylolisthesis, lumbar region   2. Radiculopathy, lumbar region   3. Weakness of right lower extremity     Plan: Avoid bending, stooping and avoid lifting weights greater than 10 lbs. Avoid prolong standing and walking. Order for a new walker with wheels. Surgery scheduling secretary Tivis Ringer, will call you in the next week to schedule for surgery.  Surgery recommended is a one level lumbar fusion L5-S1this would be done with rods, screws and cages with local bone graft and allograft (donor bone graft). Take hydrocodone for for pain. Risk of surgery includes risk of infection 1 in 200 patients, bleeding 1/2% chance you would need a transfusion.   Risk to the nerves is one in 10,000. You will need to use a brace for 3 months and wean from the brace on the 4th month. Expect improved walking and standing tolerance. Expect relief of leg pain but numbness may persist depending on the length and degree of pressure that has been present.    Follow-Up Instructions: No follow-ups on file.   Orders:  No orders of the defined types were placed in this encounter.  No orders of the defined types were placed in this encounter.     Procedures: No procedures performed   Clinical Data: No additional findings.   Subjective: Chief Complaint  Patient presents with   Lower Back - Pain    Onsetx while, worsening over last few months, pain in low back and in to right calf, no pain in thigh, NKI    56 year old female with several yeaar history of back pain and more recently is starting to radiate into the right leg with pain and burning sensation into the right anterior shin. Pain is present with standing and  walking and with sitting. Previously more back than leg pain but now it is becoming more troublesome.She does grocery shop but her husband usually does the grocery stopping. She walks around the house and at work. She does [employer], trade [employer].    Review of Systems  Constitutional: Negative.   HENT: Negative.    Eyes: Negative.   Respiratory: Negative.    Cardiovascular: Negative.   Gastrointestinal: Negative.   Endocrine: Negative.   Genitourinary: Negative.   Musculoskeletal: Negative.   Skin: Negative.   Allergic/Immunologic: Negative.   Neurological: Negative.   Hematological: Negative.   Psychiatric/Behavioral: Negative.      Objective: Vital Signs: BP 125/84 (BP Location: Left Arm, Patient Position: Sitting)    Pulse 98    Ht 5\' 5"  (1.651 m)    Wt 173 lb (78.5 kg)    BMI 28.79 kg/m   Physical Exam Constitutional:      Appearance: She is well-developed.  HENT:     Head: Normocephalic and atraumatic.  Eyes:     Pupils: Pupils are equal, round, and reactive to light.  Pulmonary:     Effort: Pulmonary effort is normal.     Breath sounds: Normal breath sounds.  Abdominal:     General: Bowel sounds are normal.     Palpations:  Abdomen is soft.  Musculoskeletal:        General: Normal range of motion.     Cervical back: Normal range of motion and neck supple.  Skin:    General: Skin is warm and dry.  Neurological:     Mental Status: She is alert and oriented to person, place, and time.  Psychiatric:        Behavior: Behavior normal.        Thought Content: Thought content normal.        Judgment: Judgment normal.    Back Exam   Tenderness  The patient is experiencing tenderness in the lumbar.  Muscle Strength  Right Quadriceps:  5/5  Left Quadriceps:  5/5  Right Hamstrings:  5/5  Left Hamstrings:  5/5   Reflexes  Patellar:  2/4 Achilles:  2/4  Comments:  Left side and right side with mild foot DF weakness, Ankle reflexes right 2+ and left 1+.  Tender right PSIS.      Specialty Comments:  No specialty comments available.  Imaging: No results found.   PMFS History: Patient Active Problem List   Diagnosis Date Noted   Chest pain with moderate risk of acute coronary syndrome 09/23/2016   Abnormal EKG 09/23/2016   Dyspnea 01/11/2012   Appendicitis, acute 05/18/2011   No past medical history on file.  Family History  Problem Relation Age of Onset   Hypertension Mother    Hyperlipidemia Father    Cancer Father    Non-Hodgkin's lymphoma Father    Aneurysm Maternal Grandfather    Lung cancer Paternal Grandmother    Stroke Paternal Grandfather    Epilepsy Sister    Epilepsy Sister     Past Surgical History:  Procedure Laterality Date   ABLATION     APPENDECTOMY  2012   BREAST IMPLANT REMOVAL     BREAST SURGERY     FOOT SURGERY     TUBAL LIGATION     WISDOM TOOTH EXTRACTION  1991   Social History   Occupational History   Not on file  Tobacco Use   Smoking status: Former    Types: Cigarettes    Quit date: 05/17/1986    Years since quitting: 35.3   Smokeless tobacco: Never  Vaping Use   Vaping Use: Never used  Substance and Sexual Activity   Alcohol use: Yes    Comment: occ   Drug use: No   Sexual activity: Not on file

## 2021-08-31 NOTE — Patient Instructions (Signed)
Plan: Avoid bending, stooping and avoid lifting weights greater than 10 lbs. Avoid prolong standing and walking. Order for a new walker with wheels. Surgery scheduling secretary Tivis Ringer, will call you in the next week to schedule for surgery.  Surgery recommended is a one level lumbar fusion right L5-S1this would be done with rods, screws and cages with local bone graft and allograft (donor bone graft). Take hydrocodone for for pain. Risk of surgery includes risk of infection 1 in 200 patients, bleeding 1/2% chance you would need a transfusion.   Risk to the nerves is one in 10,000. You will need to use a brace for 3 months and wean from the brace on the 4th month. Expect improved walking and standing tolerance. Expect relief of leg pain but numbness may persist depending on the length and degree of pressure that has been present.

## 2021-09-02 ENCOUNTER — Encounter: Payer: Self-pay | Admitting: Specialist

## 2021-09-17 ENCOUNTER — Other Ambulatory Visit: Payer: Self-pay

## 2021-09-17 ENCOUNTER — Encounter: Payer: Self-pay | Admitting: Surgery

## 2021-09-17 ENCOUNTER — Ambulatory Visit (INDEPENDENT_AMBULATORY_CARE_PROVIDER_SITE_OTHER): Payer: BC Managed Care – PPO | Admitting: Surgery

## 2021-09-17 VITALS — BP 145/98 | HR 105 | Ht 65.0 in | Wt 173.0 lb

## 2021-09-17 DIAGNOSIS — M5416 Radiculopathy, lumbar region: Secondary | ICD-10-CM

## 2021-09-17 DIAGNOSIS — M4316 Spondylolisthesis, lumbar region: Secondary | ICD-10-CM

## 2021-09-17 NOTE — Pre-Procedure Instructions (Signed)
Surgical Instructions    Your procedure is scheduled on Tuesday, September 22, 2021 at 7:30 AM.  Report to [facility] Main Entrance "A" at 5:30 A.M., then check in with the Admitting office.  Call this number if you have problems the morning of surgery:  [phone]   If you have any questions prior to your surgery date call [phone]: Open Monday-Friday 8am-4pm    Remember:  Do not eat after midnight the night before your surgery  You may drink clear liquids until 4:30 AM the morning of your surgery.   Clear liquids allowed are: Water, Non-Citrus Juices (without pulp), Carbonated Beverages, Clear Tea, Black Coffee Only (NO MILK, CREAM OR POWDERED CREAMER of any kind), and Gatorade.  Please complete your PRE-SURGERY ENSURE that was provided to you by 4:30 AM the morning of surgery.  Please, if able, drink it in one setting. DO NOT SIP.     Take these medicines the morning of surgery with A SIP OF WATER:  pantoprazole (PROTONIX) sertraline (ZOLOFT) cyclobenzaprine (FLEXERIL) - if needed  As of today, STOP taking any Aspirin (unless otherwise instructed by your surgeon) Aleve, Naproxen, Ibuprofen, Motrin, Advil, Goody's, BC's, all herbal medications, fish oil, and all vitamins.                     Do NOT Smoke (Tobacco/Vaping) for 24 hours prior to your procedure.  If you use a CPAP at night, you may bring your mask/headgear for your overnight stay.   Contacts, glasses, piercing's, hearing aid's, dentures or partials may not be worn into surgery, please bring cases for these belongings.    For patients admitted to the hospital, discharge time will be determined by your treatment team.   Patients discharged the day of surgery will not be allowed to drive home, and someone needs to stay with them for 24 hours.  NO VISITORS WILL BE ALLOWED IN PRE-OP WHERE PATIENTS ARE PREPPED FOR SURGERY.  ONLY 1 SUPPORT PERSON MAY BE PRESENT IN THE WAITING ROOM WHILE YOU ARE IN SURGERY.  IF YOU  ARE TO BE ADMITTED, ONCE YOU ARE IN YOUR ROOM YOU WILL BE ALLOWED TWO (2) VISITORS. (1) VISITOR MAY STAY OVERNIGHT BUT MUST ARRIVE TO THE ROOM BY 8pm.  Minor children may have two parents present. Special consideration for safety and communication needs will be reviewed on a case by case basis.   Special instructions:   [facility]- Preparing For Surgery  Before surgery, you can play an important role. Because skin is not sterile, your skin needs to be as free of germs as possible. You can reduce the number of germs on your skin by washing with CHG (chlorahexidine gluconate) Soap before surgery.  CHG is an antiseptic cleaner which kills germs and bonds with the skin to continue killing germs even after washing.    Oral Hygiene is also important to reduce your risk of infection.  Remember - BRUSH YOUR TEETH THE MORNING OF SURGERY WITH YOUR REGULAR TOOTHPASTE  Please do not use if you have an allergy to CHG or antibacterial soaps. If your skin becomes reddened/irritated stop using the CHG.  Do not shave (including legs and underarms) for at least 48 hours prior to first CHG shower. It is OK to shave your face.  Please follow these instructions carefully.   Shower the NIGHT BEFORE SURGERY and the MORNING OF SURGERY  If you chose to wash your hair, wash your hair first as usual with your normal shampoo.  After you shampoo, rinse your hair and body thoroughly to remove the shampoo.  Use CHG Soap as you would any other liquid soap. You can apply CHG directly to the skin and wash gently with a scrungie or a clean washcloth.   Apply the CHG Soap to your body ONLY FROM THE NECK DOWN.  Do not use on open wounds or open sores. Avoid contact with your eyes, ears, mouth and genitals (private parts). Wash Face and genitals (private parts)  with your normal soap.   Wash thoroughly, paying special attention to the area where your surgery will be performed.  Thoroughly rinse your body with warm water from  the neck down.  DO NOT shower/wash with your normal soap after using and rinsing off the CHG Soap.  Pat yourself dry with a CLEAN TOWEL.  Wear CLEAN PAJAMAS to bed the night before surgery  Place CLEAN SHEETS on your bed the night before your surgery  DO NOT SLEEP WITH PETS.   Day of Surgery: Shower with CHG soap. Do not wear jewelry, make up, nail polish, gel polish, artificial nails, or any other type of covering on natural nails including finger and toenails. If patients have artificial nails, gel coating, etc. that need to be removed by a nail salon please have this removed prior to surgery. Surgery may need to be canceled/delayed if the surgeon/anesthesiologist feels like the patient is unable to be adequately monitored. Do not wear lotions, powders, perfumes, or deodorant. Do not shave 48 hours prior to surgery. Do not bring valuables to the hospital. [facility] is not responsible for any belongings or valuables. Wear Clean/Comfortable clothing the morning of surgery Remember to brush your teeth WITH YOUR REGULAR TOOTHPASTE.   Please read over the following fact sheets that you were given.   3 days prior to your procedure or After your COVID test   You are not required to quarantine however you are required to wear a well-fitting mask when you are out and around people not in your household. If your mask becomes wet or soiled, replace with a new one.   Wash your hands often with soap and water for 20 seconds or clean your hands with an alcohol-based hand sanitizer that contains at least 60% alcohol.   Do not share personal items.   Notify your provider:  o if you are in close contact with someone who has COVID  o or if you develop a fever of 100.4 or greater, sneezing, cough, sore throat, shortness of breath or body aches.

## 2021-09-17 NOTE — Progress Notes (Signed)
56 year old white female with history L5-S1 spondylolisthesis, back pain and right lower extremity radiculopathy and weakness comes in for preop evaluation.  States that symptoms unchanged previous visit.  She is want to proceed with RIGHT L5-S1 TRANSFORAMINAL LUMBAR INTERBODY FUSION WITH SCREWS, RODS AND CAGE, LOCAL BONE GRAFT, ALLOGRAFT BONE GRAFT AND VIVIGEN as scheduled.  Today history and physical performed.  Review of systems negative.  Surgical procedure discussed previously along with potential recovery time.  All questions answered.

## 2021-09-18 ENCOUNTER — Other Ambulatory Visit: Payer: Self-pay

## 2021-09-18 ENCOUNTER — Encounter (HOSPITAL_COMMUNITY)
Admission: RE | Admit: 2021-09-18 | Discharge: 2021-09-18 | Disposition: A | Discharge status: Home / Self Care | Payer: BC Managed Care – PPO | Source: Ambulatory Visit | Attending: Specialist | Admitting: Specialist

## 2021-09-18 ENCOUNTER — Encounter (HOSPITAL_COMMUNITY): Payer: Self-pay

## 2021-09-18 VITALS — BP 128/95 | HR 93 | Temp 97.8°F | Resp 18 | Ht 65.0 in | Wt 178.6 lb

## 2021-09-18 DIAGNOSIS — M5416 Radiculopathy, lumbar region: Secondary | ICD-10-CM | POA: Insufficient documentation

## 2021-09-18 DIAGNOSIS — Z01818 Encounter for other preprocedural examination: Secondary | ICD-10-CM | POA: Diagnosis not present

## 2021-09-18 DIAGNOSIS — Z87891 Personal history of nicotine dependence: Secondary | ICD-10-CM | POA: Insufficient documentation

## 2021-09-18 DIAGNOSIS — M4317 Spondylolisthesis, lumbosacral region: Secondary | ICD-10-CM | POA: Diagnosis not present

## 2021-09-18 DIAGNOSIS — Z20822 Contact with and (suspected) exposure to covid-19: Secondary | ICD-10-CM | POA: Insufficient documentation

## 2021-09-18 DIAGNOSIS — F419 Anxiety disorder, unspecified: Secondary | ICD-10-CM | POA: Diagnosis not present

## 2021-09-18 HISTORY — DX: Other complications of anesthesia, initial encounter: T88.59XA

## 2021-09-18 HISTORY — DX: Other specified postprocedural states: R11.2

## 2021-09-18 HISTORY — DX: Other specified postprocedural states: Z98.890

## 2021-09-18 HISTORY — DX: Anxiety disorder, unspecified: F41.9

## 2021-09-18 LAB — COMPREHENSIVE METABOLIC PANEL
ALT: 45 U/L — ABNORMAL HIGH (ref 0–44)
AST: 37 U/L (ref 15–41)
Albumin: 4.3 g/dL (ref 3.5–5.0)
Alkaline Phosphatase: 58 U/L (ref 38–126)
Anion gap: 9 (ref 5–15)
BUN: 15 mg/dL (ref 6–20)
CO2: 26 mmol/L (ref 22–32)
Calcium: 9.4 mg/dL (ref 8.9–10.3)
Chloride: 104 mmol/L (ref 98–111)
Creatinine, Ser: 0.75 mg/dL (ref 0.44–1.00)
GFR, Estimated: >60 mL/min (ref >60)
Glucose, Bld: 86 mg/dL (ref 70–99)
Potassium: 3.5 mmol/L (ref 3.5–5.1)
Sodium: 139 mmol/L (ref 135–145)
Total Bilirubin: 0.6 mg/dL (ref 0.3–1.2)
Total Protein: 6.8 g/dL (ref 6.5–8.1)

## 2021-09-18 LAB — TYPE AND SCREEN
ABO/RH(D): O NEG
Antibody Screen: NEGATIVE

## 2021-09-18 LAB — CBC
HCT: 43.2 % (ref 36.0–46.0)
Hemoglobin: 14.9 g/dL (ref 12.0–15.0)
MCH: 29.8 pg (ref 26.0–34.0)
MCHC: 34.5 g/dL (ref 30.0–36.0)
MCV: 86.4 fL (ref 80.0–100.0)
Platelets: 325 10*3/uL (ref 150–400)
RBC: 5 MIL/uL (ref 3.87–5.11)
RDW: 12.4 % (ref 11.5–15.5)
WBC: 6.4 10*3/uL (ref 4.0–10.5)
nRBC: 0 % (ref 0.0–0.2)

## 2021-09-18 LAB — SURGICAL PCR SCREEN
MRSA, PCR: NEGATIVE
Staphylococcus aureus: NEGATIVE

## 2021-09-18 LAB — SARS CORONAVIRUS 2 (TAT 6-24 HRS): SARS Coronavirus 2: NEGATIVE

## 2021-09-18 NOTE — Progress Notes (Signed)
PCP - Dr. Burnett Kanaris Cardiologist - Denies  PPM/ICD - Denies  Chest x-ray - N/A EKG - 09/18/21 Stress Test - 10/06/16 ECHO - 10/12/16 Cardiac Cath - Denies  Sleep Study - Denies  Patient denies having diabetes.  Blood Thinner Instructions: N/A Aspirin Instructions: N/A  ERAS Protcol - Yes, PRE-SURGERY Ensure  COVID TEST- 09/18/21 in PAT   Anesthesia review: Yes, abnormal EKG  Patient denies shortness of breath, fever, cough and chest pain at PAT appointment   All instructions explained to the patient, with a verbal understanding of the material. Patient agrees to go over the instructions while at home for a better understanding. Patient also instructed to self quarantine after being tested for COVID-19. The opportunity to ask questions was provided.

## 2021-09-18 NOTE — H&P (Signed)
Autumn Rose is an 56 y.o. female.   Chief Complaint: Low back pain and right lower extremity radiculopathy  HPI: 56 year old white female with history L5-S1 spondylolisthesis, back pain and right lower extremity radiculopathy and weakness comes in for preop evaluation.  States that symptoms unchanged previous visit.  She is want to proceed with RIGHT L5-S1 TRANSFORAMINAL LUMBAR INTERBODY FUSION WITH SCREWS, RODS AND CAGE, LOCAL BONE GRAFT, ALLOGRAFT BONE GRAFT AND VIVIGEN as scheduled.  Today history and physical performed.  Review of systems negative.  No past medical history on file.  Past Surgical History:  Procedure Laterality Date   ABLATION     APPENDECTOMY  2012   BREAST IMPLANT REMOVAL     BREAST SURGERY     FOOT SURGERY     TUBAL LIGATION     WISDOM TOOTH EXTRACTION  1991    Family History  Problem Relation Age of Onset   Hypertension Mother    Hyperlipidemia Father    Cancer Father    Non-Hodgkin's lymphoma Father    Aneurysm Maternal Grandfather    Lung cancer Paternal Grandmother    Stroke Paternal Grandfather    Epilepsy Sister    Epilepsy Sister    Social History:  reports that she quit smoking about 35 years ago. Her smoking use included cigarettes. She has never used smokeless tobacco. She reports current alcohol use. She reports that she does not use drugs.  Allergies:  Allergies  Allergen Reactions   Oxycodone Itching    No medications prior to admission.    No results found for this or any previous visit (from the past 48 hour(s)). No results found.  Review of Systems  Constitutional:  Positive for activity change.  HENT: Negative.    Respiratory: Negative.    Cardiovascular: Negative.   Gastrointestinal: Negative.   Genitourinary: Negative.   Musculoskeletal:  Positive for back pain.  Skin: Negative.   Neurological:  Positive for numbness.  Psychiatric/Behavioral: Negative.     There were no vitals taken for this visit. Physical  Exam HENT:     Head: Normocephalic and atraumatic.     Nose: Nose normal.  Eyes:     Extraocular Movements: Extraocular movements intact.  Cardiovascular:     Rate and Rhythm: Regular rhythm.     Heart sounds: No murmur heard. Pulmonary:     Effort: Pulmonary effort is normal. No respiratory distress.     Breath sounds: Normal breath sounds.  Abdominal:     General: There is no distension.  Musculoskeletal:        General: Tenderness present.     Cervical back: Normal range of motion.  Neurological:     Mental Status: She is alert and oriented to person, place, and time.     Motor: Weakness (Trace right gastroc) present.  Psychiatric:        Mood and Affect: Mood normal.     Assessment/Plan L5-S1 spondylolisthesis, low back pain, right lower extremity radiculopathy and weakness   We will proceed with RIGHT L5-S1 TRANSFORAMINAL LUMBAR INTERBODY FUSION WITH SCREWS, RODS AND CAGE, LOCAL BONE GRAFT, ALLOGRAFT BONE GRAFT AND VIVIGEN.  Surgical procedure discussed along with potential rehab/recovery time.  All questions answered.   Benjiman Core, PA-C 09/18/2021, 1:59 PM

## 2021-09-21 ENCOUNTER — Encounter (HOSPITAL_COMMUNITY): Payer: Self-pay | Admitting: Specialist

## 2021-09-21 NOTE — Progress Notes (Signed)
Anesthesia Chart Review:  Case: [id number] Date/Time: 09/22/21 0715   Procedure: RIGHT L5-S1 TRANSFORAMINAL LUMBAR INTERBODY FUSION WITH SCREWS, RODS AND CAGE, LOCAL BONE GRAFT, ALLOGRAFT BONE GRAFT AND VIVIGEN   Anesthesia type: General   Pre-op diagnosis: L5-S1 spondylolisthesis with right lumbar radiculopathy   Location: [facility] ROOM 05 / [facility]   Surgeons: Kerrin Champagne, MD       DISCUSSION: Patient is a 56 year old female scheduled for the above procedure.  History includes former smoker (quit 05/17/86), postoperative N/V, anxiety, appendectomy (04/30/11), augmentation mammaplasty (2002, removal 03/2019), endometrial ablation. Reported bronchospasm after having tubal ligation in 2011.   09/18/2021 presurgical COVID-19 test negative.  Anesthesia team to evaluate on the day of surgery. She is on phentermine--based on documentation from her PAT RN visit, her last dose would be this morning (09/21/21) with surgery tomorrow.   Anesthesia team to evaluate on the day of surgery.    VS: BP (!) 128/95    Pulse 93    Temp 36.6 C (Oral)    Resp 18    Ht 5\' 5"  (1.651 m)    Wt 81 kg    LMP 12/07/2016 (Within Weeks)    SpO2 100%    BMI 29.72 kg/m    PROVIDERS: 02/06/2017, PA-C is PCP  - Burnis Medin, PA-C is [facility] provider. Seen 03/27/21 for abdominal pain. By notes, she had EGD/colonoscopy 04/2020, so did not think they needed to be repeated at that point.  No acute findings on ultrasound. -She is not followed routinely by cardiology.  She did see 05/2020, PA-C/Berry, Corine Shelter, MD is [phone] following ED visit for chest pain, ruled out for ACS.  She subsequently had a reassuring stress test and echocardiogram that same year.   LABS: Labs reviewed: Acceptable for surgery. Labs trends suggest chronic mildly elevated ALT since 12/2019 (ALT 45-98). Appears her PCP had been monitoring in setting of Crestor--last ALT with [facility] was 68 on 06/15/21--Crestor is not on her current [place]  medication list.     (all labs ordered are listed, but only abnormal results are displayed)  Labs Reviewed  COMPREHENSIVE METABOLIC PANEL - Abnormal; Notable for the following components:      Result Value   ALT 45 (*)    All other components within normal limits  SURGICAL PCR SCREEN  SARS CORONAVIRUS 2 (TAT 6-24 HRS)  CBC  TYPE AND SCREEN     IMAGES: MRI L-spine 08/20/21 ([facility]): IMPRESSION: L5-S1 grade 1 anterolisthesis, secondary to bilateral L5 pars defects, with mild facet arthropathy, which results in mild-to-moderate bilateral neural foraminal narrowing.   10/18/21 Abd 04/09/21 ([facility]): IMPRESSION:  No sonographic finding to explain the patient's abdominal pain.     EKG: 09/18/21: Normal sinus rhythm Low voltage QRS Nonspecific T wave abnormality Abnormal ECG When compared with ECG of 18-Dec-2019 11:25, QT has shortened Confirmed by Croitoru, Mihai [phone]) on 09/18/2021 8:38:20 PM   CV: Echo 10/12/16: Study Conclusions  - Left ventricle: The cavity size was normal. Systolic function was    normal. The estimated ejection fraction was in the range of 60%    to 65%. Wall motion was normal; there were no regional wall    motion abnormalities. Left ventricular diastolic function    parameters were normal.  - Aortic valve: Trileaflet; normal thickness leaflets. There was no    regurgitation.  - Aortic root: The aortic root was normal in size.  - Mitral valve: There was trivial regurgitation.  -  Left atrium: The atrium was mildly dilated.  - Right ventricle: Systolic function was normal.  - Tricuspid valve: There was trivial regurgitation.  - Pulmonic valve: There was no regurgitation.  - Pulmonary arteries: Systolic pressure was within the normal    range.  - Inferior vena cava: The vessel was normal in size.  - Pericardium, extracardiac: There was no pericardial effusion.    Nuclear stress test 10/06/16: The left ventricular ejection fraction is hyperdynamic  (>65%). Nuclear stress EF: 72%. Blood pressure demonstrated a normal response to exercise. No T wave inversion was noted during stress. There was no ST segment deviation noted during stress. This is a low risk study.   Normal perfusion. LVEF 72% with normal wall motion. This is a low risk study.  Past Medical History:  Diagnosis Date   Anxiety    Complication of anesthesia    Bronchospasm after having tubal ligation in 2011   PONV (postoperative nausea and vomiting)     Past Surgical History:  Procedure Laterality Date   ABLATION     APPENDECTOMY  2012   BILATERAL CARPAL TUNNEL RELEASE Bilateral 2021   November 30th & December 14th   BREAST IMPLANT REMOVAL     BREAST SURGERY     FOOT SURGERY     LIPOMA EXCISION Left 2005   MENISCUS REPAIR Right 2012   TUBAL LIGATION     WISDOM TOOTH EXTRACTION  1991    MEDICATIONS:  Calcium Carbonate-Vitamin D (CALCIUM 600 + D PO)   cyclobenzaprine (FLEXERIL) 10 MG tablet   fish oil-omega-3 fatty acids 1000 MG capsule   Multiple Vitamins-Minerals (MULTIVITAMIN WITH MINERALS) tablet   pantoprazole (PROTONIX) 40 MG tablet   phentermine (ADIPEX-P) 37.5 MG tablet   sertraline (ZOLOFT) 100 MG tablet   No current facility-administered medications for this encounter.    Shonna Chock, PA-C Surgical Short Stay/Anesthesiology [facility] Phone [phone] [facility] Phone [phone] 09/21/2021 1:20 PM

## 2021-09-21 NOTE — Anesthesia Preprocedure Evaluation (Addendum)
Anesthesia Evaluation  Patient identified by MRN, date of birth, ID band Patient awake    Reviewed: Allergy & Precautions, NPO status , Patient's Chart, lab work & pertinent test results  History of Anesthesia Complications (+) PONV and history of anesthetic complications  Airway Mallampati: III  TM Distance: >3 FB Neck ROM: Full    Dental no notable dental hx. (+) Teeth Intact, Dental Advisory Given   Pulmonary shortness of breath and with exertion, former smoker,    Pulmonary exam normal breath sounds clear to auscultation       Cardiovascular negative cardio ROS Normal cardiovascular exam Rhythm:Regular Rate:Normal     Neuro/Psych Anxiety    GI/Hepatic Neg liver ROS, GERD  Medicated,  Endo/Other  negative endocrine ROS  Renal/GU negative Renal ROS  negative genitourinary   Musculoskeletal Spondylolisthesis L5-S1, right lumbar radiculopathy Lumbar spinal stenosis   Abdominal   Peds  Hematology negative hematology ROS (+)   Anesthesia Other Findings   Reproductive/Obstetrics                          Anesthesia Physical Anesthesia Plan  ASA: 2  Anesthesia Plan: General   Post-op Pain Management: Ketamine IV, Celebrex PO (pre-op) and Gabapentin PO (pre-op)   Induction: Intravenous  PONV Risk Score and Plan: 4 or greater and Treatment may vary due to age or medical condition, Scopolamine patch - Pre-op, Midazolam, Ondansetron and Dexamethasone  Airway Management Planned: Oral ETT  Additional Equipment: None  Intra-op Plan:   Post-operative Plan: Extubation in OR  Informed Consent: I have reviewed the patients History and Physical, chart, labs and discussed the procedure including the risks, benefits and alternatives for the proposed anesthesia with the patient or authorized representative who has indicated his/her understanding and acceptance.     Dental advisory given  Plan  Discussed with: CRNA and Anesthesiologist  Anesthesia Plan Comments: (PAT note written 09/21/2021 by Myra Gianotti, PA-C. History of phentermine.  )      Anesthesia Quick Evaluation

## 2021-09-22 ENCOUNTER — Ambulatory Visit (HOSPITAL_COMMUNITY)
Admission: RE | Disposition: A | Discharge status: Home / Self Care | Payer: Self-pay | Source: Home / Self Care | Attending: Specialist

## 2021-09-22 ENCOUNTER — Other Ambulatory Visit: Payer: Self-pay

## 2021-09-22 ENCOUNTER — Ambulatory Visit (HOSPITAL_COMMUNITY): Payer: BC Managed Care – PPO

## 2021-09-22 ENCOUNTER — Ambulatory Visit (HOSPITAL_COMMUNITY): Payer: BC Managed Care – PPO | Admitting: Anesthesiology

## 2021-09-22 ENCOUNTER — Encounter (HOSPITAL_COMMUNITY): Payer: Self-pay | Admitting: Specialist

## 2021-09-22 ENCOUNTER — Observation Stay (HOSPITAL_COMMUNITY)
Admission: RE | Admit: 2021-09-22 | Discharge: 2021-09-23 | Disposition: A | Discharge status: Home / Self Care | Payer: BC Managed Care – PPO | Attending: Specialist | Admitting: Specialist

## 2021-09-22 DIAGNOSIS — Z01818 Encounter for other preprocedural examination: Secondary | ICD-10-CM

## 2021-09-22 DIAGNOSIS — M4326 Fusion of spine, lumbar region: Secondary | ICD-10-CM | POA: Diagnosis present

## 2021-09-22 DIAGNOSIS — M4317 Spondylolisthesis, lumbosacral region: Principal | ICD-10-CM | POA: Insufficient documentation

## 2021-09-22 DIAGNOSIS — Z419 Encounter for procedure for purposes other than remedying health state, unspecified: Secondary | ICD-10-CM

## 2021-09-22 DIAGNOSIS — Z87891 Personal history of nicotine dependence: Secondary | ICD-10-CM | POA: Insufficient documentation

## 2021-09-22 LAB — ABO/RH: ABO/RH(D): O NEG

## 2021-09-22 SURGERY — POSTERIOR LUMBAR FUSION 1 LEVEL
Anesthesia: General | Site: Spine Lumbar

## 2021-09-22 MED ORDER — DOCUSATE SODIUM 100 MG PO CAPS
100.0000 mg | ORAL_CAPSULE | Freq: Two times a day (BID) | ORAL | Status: DC
  Administered 2021-09-22 – 2021-09-23 (×3): 100 mg via ORAL
  Filled 2021-09-22 (×3): qty 1

## 2021-09-22 MED ORDER — ACETAMINOPHEN 650 MG RE SUPP: 650.0000 mg | RECTAL | Status: DC | PRN

## 2021-09-22 MED ORDER — PHENYLEPHRINE 40 MCG/ML (10ML) SYRINGE FOR IV PUSH (FOR BLOOD PRESSURE SUPPORT)
PREFILLED_SYRINGE | INTRAVENOUS | Status: DC | PRN
  Administered 2021-09-22: 120 ug via INTRAVENOUS
  Administered 2021-09-22 (×4): 80 ug via INTRAVENOUS
  Administered 2021-09-22: 160 ug via INTRAVENOUS
  Administered 2021-09-22: 120 ug via INTRAVENOUS

## 2021-09-22 MED ORDER — BUPIVACAINE LIPOSOME 1.3 % IJ SUSP
INTRAMUSCULAR | Status: DC | PRN
  Administered 2021-09-22: 15 mL

## 2021-09-22 MED ORDER — SODIUM CHLORIDE 0.9 % IV SOLN: 250.0000 mL | INTRAVENOUS | Status: DC

## 2021-09-22 MED ORDER — ONDANSETRON HCL 4 MG PO TABS: 4.0000 mg | ORAL_TABLET | Freq: Four times a day (QID) | ORAL | Status: DC | PRN

## 2021-09-22 MED ORDER — ROCURONIUM BROMIDE 10 MG/ML (PF) SYRINGE
PREFILLED_SYRINGE | INTRAVENOUS | Status: DC | PRN
Start: 2021-09-22 — End: 2021-09-22
  Administered 2021-09-22 (×2): 20 mg via INTRAVENOUS
  Administered 2021-09-22: 30 mg via INTRAVENOUS
  Administered 2021-09-22: 10 mg via INTRAVENOUS
  Administered 2021-09-22: 20 mg via INTRAVENOUS
  Administered 2021-09-22: 70 mg via INTRAVENOUS

## 2021-09-22 MED ORDER — HYDROCODONE-ACETAMINOPHEN 7.5-325 MG PO TABS
2.0000 | ORAL_TABLET | ORAL | Status: DC | PRN
  Administered 2021-09-22 – 2021-09-23 (×4): 2 via ORAL
  Filled 2021-09-22 (×4): qty 2

## 2021-09-22 MED ORDER — CEFAZOLIN SODIUM 1 G IJ SOLR
INTRAMUSCULAR | Status: AC
  Filled 2021-09-22: qty 20

## 2021-09-22 MED ORDER — BUPIVACAINE LIPOSOME 1.3 % IJ SUSP
INTRAMUSCULAR | Status: AC
  Filled 2021-09-22: qty 20

## 2021-09-22 MED ORDER — LACTATED RINGERS IV SOLN: INTRAVENOUS | Status: DC

## 2021-09-22 MED ORDER — MENTHOL 3 MG MT LOZG: 1.0000 | LOZENGE | OROMUCOSAL | Status: DC | PRN

## 2021-09-22 MED ORDER — HYDROMORPHONE HCL 1 MG/ML IJ SOLN
0.2500 mg | INTRAMUSCULAR | Status: DC | PRN
  Administered 2021-09-22 (×2): 0.5 mg via INTRAVENOUS

## 2021-09-22 MED ORDER — BUPIVACAINE LIPOSOME 1.3 % IJ SUSP: 10.0000 mL | Freq: Once | INTRAMUSCULAR | Status: DC

## 2021-09-22 MED ORDER — LIDOCAINE 2% (20 MG/ML) 5 ML SYRINGE
INTRAMUSCULAR | Status: DC | PRN
Start: 2021-09-22 — End: 2021-09-22
  Administered 2021-09-22: 60 mg via INTRAVENOUS

## 2021-09-22 MED ORDER — TRANEXAMIC ACID-NACL 1000-0.7 MG/100ML-% IV SOLN
INTRAVENOUS | Status: DC | PRN
  Administered 2021-09-22: 1000 mg via INTRAVENOUS

## 2021-09-22 MED ORDER — DEXAMETHASONE SODIUM PHOSPHATE 10 MG/ML IJ SOLN
INTRAMUSCULAR | Status: DC | PRN
  Administered 2021-09-22: 10 mg via INTRAVENOUS

## 2021-09-22 MED ORDER — PHENTERMINE HCL 37.5 MG PO TABS: 37.5000 mg | ORAL_TABLET | Freq: Every day | ORAL | Status: DC

## 2021-09-22 MED ORDER — KETAMINE HCL 50 MG/5ML IJ SOSY
PREFILLED_SYRINGE | INTRAMUSCULAR | Status: AC
  Filled 2021-09-22: qty 5

## 2021-09-22 MED ORDER — BUPIVACAINE HCL (PF) 0.5 % IJ SOLN
INTRAMUSCULAR | Status: AC
  Filled 2021-09-22: qty 30

## 2021-09-22 MED ORDER — METHOCARBAMOL 1000 MG/10ML IJ SOLN
500.0000 mg | Freq: Four times a day (QID) | INTRAVENOUS | Status: DC | PRN
  Filled 2021-09-22: qty 5

## 2021-09-22 MED ORDER — ALUM & MAG HYDROXIDE-SIMETH 200-200-20 MG/5ML PO SUSP: 30.0000 mL | Freq: Four times a day (QID) | ORAL | Status: DC | PRN

## 2021-09-22 MED ORDER — BISACODYL 5 MG PO TBEC: 5.0000 mg | DELAYED_RELEASE_TABLET | Freq: Every day | ORAL | Status: DC | PRN

## 2021-09-22 MED ORDER — METHOCARBAMOL 500 MG PO TABS
500.0000 mg | ORAL_TABLET | Freq: Four times a day (QID) | ORAL | Status: DC | PRN
  Administered 2021-09-22: 500 mg via ORAL
  Filled 2021-09-22: qty 1

## 2021-09-22 MED ORDER — PROPOFOL 10 MG/ML IV BOLUS
INTRAVENOUS | Status: DC | PRN
Start: 2021-09-22 — End: 2021-09-22
  Administered 2021-09-22: 30 mg via INTRAVENOUS
  Administered 2021-09-22: 50 mg via INTRAVENOUS
  Administered 2021-09-22: 150 mg via INTRAVENOUS

## 2021-09-22 MED ORDER — GABAPENTIN 300 MG PO CAPS
300.0000 mg | ORAL_CAPSULE | Freq: Three times a day (TID) | ORAL | Status: DC
  Administered 2021-09-22 – 2021-09-23 (×3): 300 mg via ORAL
  Filled 2021-09-22 (×3): qty 1

## 2021-09-22 MED ORDER — ONDANSETRON HCL 4 MG/2ML IJ SOLN
INTRAMUSCULAR | Status: AC
  Filled 2021-09-22: qty 2

## 2021-09-22 MED ORDER — FENTANYL CITRATE (PF) 250 MCG/5ML IJ SOLN
INTRAMUSCULAR | Status: AC
  Filled 2021-09-22: qty 5

## 2021-09-22 MED ORDER — PANTOPRAZOLE SODIUM 40 MG IV SOLR: 40.0000 mg | Freq: Every day | INTRAVENOUS | Status: DC

## 2021-09-22 MED ORDER — SCOPOLAMINE 1 MG/3DAYS TD PT72
1.0000 | MEDICATED_PATCH | TRANSDERMAL | Status: DC
  Administered 2021-09-22: 1.5 mg via TRANSDERMAL
  Filled 2021-09-22: qty 1

## 2021-09-22 MED ORDER — TRANEXAMIC ACID-NACL 1000-0.7 MG/100ML-% IV SOLN
INTRAVENOUS | Status: AC
  Filled 2021-09-22: qty 100

## 2021-09-22 MED ORDER — PHENYLEPHRINE 40 MCG/ML (10ML) SYRINGE FOR IV PUSH (FOR BLOOD PRESSURE SUPPORT)
PREFILLED_SYRINGE | INTRAVENOUS | Status: AC
  Filled 2021-09-22: qty 10

## 2021-09-22 MED ORDER — SODIUM CHLORIDE 0.9 % IV SOLN: INTRAVENOUS | Status: DC

## 2021-09-22 MED ORDER — ACETAMINOPHEN 325 MG PO TABS: 650.0000 mg | ORAL_TABLET | ORAL | Status: DC | PRN

## 2021-09-22 MED ORDER — 0.9 % SODIUM CHLORIDE (POUR BTL) OPTIME
TOPICAL | Status: DC | PRN
  Administered 2021-09-22: 1000 mL

## 2021-09-22 MED ORDER — CEFAZOLIN SODIUM-DEXTROSE 2-4 GM/100ML-% IV SOLN
2.0000 g | Freq: Three times a day (TID) | INTRAVENOUS | Status: AC
  Administered 2021-09-22 (×2): 2 g via INTRAVENOUS
  Filled 2021-09-22 (×2): qty 100

## 2021-09-22 MED ORDER — MIDAZOLAM HCL 5 MG/5ML IJ SOLN
INTRAMUSCULAR | Status: DC | PRN
  Administered 2021-09-22: 2 mg via INTRAVENOUS

## 2021-09-22 MED ORDER — KETAMINE HCL-SODIUM CHLORIDE 100-0.9 MG/10ML-% IV SOSY
PREFILLED_SYRINGE | INTRAVENOUS | Status: DC | PRN
  Administered 2021-09-22 (×2): 10 mg via INTRAVENOUS
  Administered 2021-09-22: 20 mg via INTRAVENOUS

## 2021-09-22 MED ORDER — ONDANSETRON HCL 4 MG/2ML IJ SOLN: 4.0000 mg | Freq: Four times a day (QID) | INTRAMUSCULAR | Status: DC | PRN

## 2021-09-22 MED ORDER — SERTRALINE HCL 50 MG PO TABS
50.0000 mg | ORAL_TABLET | Freq: Every day | ORAL | Status: DC
  Administered 2021-09-22 – 2021-09-23 (×2): 50 mg via ORAL
  Filled 2021-09-22 (×2): qty 1

## 2021-09-22 MED ORDER — DEXAMETHASONE SODIUM PHOSPHATE 10 MG/ML IJ SOLN
INTRAMUSCULAR | Status: AC
  Filled 2021-09-22: qty 1

## 2021-09-22 MED ORDER — PROPOFOL 10 MG/ML IV BOLUS
INTRAVENOUS | Status: AC
  Filled 2021-09-22: qty 20

## 2021-09-22 MED ORDER — LACTATED RINGERS IV SOLN: INTRAVENOUS | Status: DC | PRN

## 2021-09-22 MED ORDER — ROCURONIUM BROMIDE 10 MG/ML (PF) SYRINGE
PREFILLED_SYRINGE | INTRAVENOUS | Status: AC
  Filled 2021-09-22: qty 10

## 2021-09-22 MED ORDER — SUGAMMADEX SODIUM 200 MG/2ML IV SOLN
INTRAVENOUS | Status: DC | PRN
  Administered 2021-09-22 (×2): 100 mg via INTRAVENOUS

## 2021-09-22 MED ORDER — ONDANSETRON HCL 4 MG/2ML IJ SOLN: 4.0000 mg | Freq: Once | INTRAMUSCULAR | Status: DC | PRN

## 2021-09-22 MED ORDER — HYDROMORPHONE HCL 1 MG/ML IJ SOLN
INTRAMUSCULAR | Status: AC
  Filled 2021-09-22: qty 1

## 2021-09-22 MED ORDER — FLEET ENEMA 7-19 GM/118ML RE ENEM: 1.0000 | ENEMA | Freq: Once | RECTAL | Status: DC | PRN

## 2021-09-22 MED ORDER — CEFAZOLIN SODIUM-DEXTROSE 2-4 GM/100ML-% IV SOLN
2.0000 g | INTRAVENOUS | Status: AC
  Administered 2021-09-22 (×2): 2 g via INTRAVENOUS
  Filled 2021-09-22: qty 100

## 2021-09-22 MED ORDER — POLYETHYLENE GLYCOL 3350 17 G PO PACK: 17.0000 g | PACK | Freq: Every day | ORAL | Status: DC | PRN

## 2021-09-22 MED ORDER — ADULT MULTIVITAMIN W/MINERALS CH
1.0000 | ORAL_TABLET | Freq: Every day | ORAL | Status: DC
  Administered 2021-09-22 – 2021-09-23 (×2): 1 via ORAL
  Filled 2021-09-22 (×2): qty 1

## 2021-09-22 MED ORDER — BUPIVACAINE HCL 0.5 % IJ SOLN
INTRAMUSCULAR | Status: DC | PRN
  Administered 2021-09-22: 15 mL

## 2021-09-22 MED ORDER — LIDOCAINE 2% (20 MG/ML) 5 ML SYRINGE
INTRAMUSCULAR | Status: AC
  Filled 2021-09-22: qty 5

## 2021-09-22 MED ORDER — HYDROCODONE-ACETAMINOPHEN 7.5-325 MG PO TABS
1.0000 | ORAL_TABLET | Freq: Four times a day (QID) | ORAL | Status: DC
  Administered 2021-09-23: 1 via ORAL
  Filled 2021-09-22: qty 1

## 2021-09-22 MED ORDER — THROMBIN 20000 UNITS EX KIT
PACK | CUTANEOUS | Status: AC
  Filled 2021-09-22: qty 1

## 2021-09-22 MED ORDER — ORAL CARE MOUTH RINSE: 15.0000 mL | Freq: Once | OROMUCOSAL | Status: AC

## 2021-09-22 MED ORDER — FENTANYL CITRATE (PF) 250 MCG/5ML IJ SOLN
INTRAMUSCULAR | Status: DC | PRN
  Administered 2021-09-22 (×2): 50 ug via INTRAVENOUS
  Administered 2021-09-22: 150 ug via INTRAVENOUS
  Administered 2021-09-22 (×2): 50 ug via INTRAVENOUS

## 2021-09-22 MED ORDER — PROPOFOL 500 MG/50ML IV EMUL
INTRAVENOUS | Status: DC | PRN
  Administered 2021-09-22: 25 ug/kg/min via INTRAVENOUS

## 2021-09-22 MED ORDER — SODIUM CHLORIDE 0.9% FLUSH: 3.0000 mL | INTRAVENOUS | Status: DC | PRN

## 2021-09-22 MED ORDER — HYDROCODONE-ACETAMINOPHEN 7.5-325 MG PO TABS: 1.0000 | ORAL_TABLET | ORAL | Status: DC | PRN

## 2021-09-22 MED ORDER — OMEGA-3-ACID ETHYL ESTERS 1 G PO CAPS
1.0000 g | ORAL_CAPSULE | Freq: Every day | ORAL | Status: DC
  Administered 2021-09-22 – 2021-09-23 (×2): 1 g via ORAL
  Filled 2021-09-22 (×2): qty 1

## 2021-09-22 MED ORDER — MIDAZOLAM HCL 2 MG/2ML IJ SOLN
INTRAMUSCULAR | Status: AC
  Filled 2021-09-22: qty 2

## 2021-09-22 MED ORDER — KETOROLAC TROMETHAMINE 15 MG/ML IJ SOLN
15.0000 mg | Freq: Four times a day (QID) | INTRAMUSCULAR | Status: DC
  Administered 2021-09-22 – 2021-09-23 (×3): 15 mg via INTRAVENOUS
  Filled 2021-09-22 (×3): qty 1

## 2021-09-22 MED ORDER — OYSTER SHELL CALCIUM/D3 500-5 MG-MCG PO TABS
1.0000 | ORAL_TABLET | Freq: Every day | ORAL | Status: DC
  Administered 2021-09-22 – 2021-09-23 (×2): 1 via ORAL
  Filled 2021-09-22 (×2): qty 1

## 2021-09-22 MED ORDER — MORPHINE SULFATE (PF) 2 MG/ML IV SOLN: 1.0000 mg | INTRAVENOUS | Status: DC | PRN

## 2021-09-22 MED ORDER — SODIUM CHLORIDE 0.9% FLUSH
3.0000 mL | Freq: Two times a day (BID) | INTRAVENOUS | Status: DC
  Administered 2021-09-22: 3 mL via INTRAVENOUS

## 2021-09-22 MED ORDER — PHENOL 1.4 % MT LIQD: 1.0000 | OROMUCOSAL | Status: DC | PRN

## 2021-09-22 MED ORDER — CHLORHEXIDINE GLUCONATE 0.12 % MT SOLN: 15.0000 mL | Freq: Once | OROMUCOSAL | Status: AC

## 2021-09-22 MED ORDER — OMEGA-3 FATTY ACIDS 1000 MG PO CAPS: 1.0000 g | ORAL_CAPSULE | Freq: Every day | ORAL | Status: DC

## 2021-09-22 MED ORDER — CHLORHEXIDINE GLUCONATE 0.12 % MT SOLN
OROMUCOSAL | Status: AC
  Administered 2021-09-22: 15 mL via OROMUCOSAL
  Filled 2021-09-22: qty 15

## 2021-09-22 MED ORDER — PANTOPRAZOLE SODIUM 40 MG PO TBEC
40.0000 mg | DELAYED_RELEASE_TABLET | Freq: Two times a day (BID) | ORAL | Status: DC
  Administered 2021-09-22 – 2021-09-23 (×2): 40 mg via ORAL
  Filled 2021-09-22 (×2): qty 1

## 2021-09-22 MED ORDER — THROMBIN 20000 UNITS EX SOLR
CUTANEOUS | Status: DC | PRN
  Administered 2021-09-22: 20 mL via TOPICAL

## 2021-09-22 SURGICAL SUPPLY — 87 items
ADH SKN CLS APL DERMABOND .7 (GAUZE/BANDAGES/DRESSINGS) ×1
AGENT HMST MTR 8 SURGIFLO (HEMOSTASIS)
APL SKNCLS STERI-STRIP NONHPOA (GAUZE/BANDAGES/DRESSINGS) ×1
BAG COUNTER SPONGE SURGICOUNT (BAG) ×3 IMPLANT
BAG SPNG CNTER NS LX DISP (BAG) ×1
BENZOIN TINCTURE PRP APPL 2/3 (GAUZE/BANDAGES/DRESSINGS) ×1 IMPLANT
BLADE CLIPPER SURG (BLADE) IMPLANT
BUR MATCHSTICK NEURO 3.0 LAGG (BURR) ×3 IMPLANT
BUR SABER RD CUTTING 3.0 (BURR) IMPLANT
CANNULA GRAFT BNE VG PRE-FILL (Bone Implant) IMPLANT
CAP LOCKING THREADED (Cap) ×4 IMPLANT
CORTICAL TAP 7.5/6.5 IMPLANT
COVER BACK TABLE 80X110 HD (DRAPES) ×3 IMPLANT
COVER SURGICAL LIGHT HANDLE (MISCELLANEOUS) ×3 IMPLANT
DECANTER SPIKE VIAL GLASS SM (MISCELLANEOUS) ×3 IMPLANT
DERMABOND ADVANCED (GAUZE/BANDAGES/DRESSINGS) ×1
DERMABOND ADVANCED .7 DNX12 (GAUZE/BANDAGES/DRESSINGS) ×2 IMPLANT
DISPENSER GRAFT BNE VG (MISCELLANEOUS) IMPLANT
DISPENSER VIVIGEN BONE GRAFT (MISCELLANEOUS) ×2 IMPLANT
DRAPE C-ARM 42X72 X-RAY (DRAPES) ×3 IMPLANT
DRAPE C-ARMOR (DRAPES) ×3 IMPLANT
DRAPE MICROSCOPE LEICA (MISCELLANEOUS) ×3 IMPLANT
DRAPE POUCH INSTRU U-SHP 10X18 (DRAPES) ×3 IMPLANT
DRAPE SURG 17X23 STRL (DRAPES) ×12 IMPLANT
DRSG MEPILEX BORDER 4X4 (GAUZE/BANDAGES/DRESSINGS) IMPLANT
DRSG MEPILEX BORDER 4X8 (GAUZE/BANDAGES/DRESSINGS) ×1 IMPLANT
DURAPREP 26ML APPLICATOR (WOUND CARE) ×3 IMPLANT
ELECT BLADE 4.0 EZ CLEAN MEGAD (MISCELLANEOUS) ×2
ELECT BLADE 6.5 EXT (BLADE) IMPLANT
ELECT CAUTERY BLADE 6.4 (BLADE) ×3 IMPLANT
ELECT REM PT RETURN 9FT ADLT (ELECTROSURGICAL) ×2
ELECTRODE BLDE 4.0 EZ CLN MEGD (MISCELLANEOUS) ×2 IMPLANT
ELECTRODE REM PT RTRN 9FT ADLT (ELECTROSURGICAL) ×2 IMPLANT
EVACUATOR 1/8 PVC DRAIN (DRAIN) IMPLANT
GAUZE SPONGE 4X4 12PLY STRL (GAUZE/BANDAGES/DRESSINGS) ×3 IMPLANT
GLOVE SRG 8 PF TXTR STRL LF DI (GLOVE) ×2 IMPLANT
GLOVE SURG 8.5 LATEX PF (GLOVE) ×3 IMPLANT
GLOVE SURG LTX SZ9 (GLOVE) ×3 IMPLANT
GLOVE SURG ORTHO LTX SZ7.5 (GLOVE) ×3 IMPLANT
GLOVE SURG UNDER POLY LF SZ8 (GLOVE) ×2
GOWN STRL REUS W/ TWL LRG LVL3 (GOWN DISPOSABLE) ×2 IMPLANT
GOWN STRL REUS W/TWL 2XL LVL3 (GOWN DISPOSABLE) ×6 IMPLANT
GOWN STRL REUS W/TWL LRG LVL3 (GOWN DISPOSABLE) ×2
GRAFT BONE CANNULA VIVIGEN 3 (Bone Implant) ×6 IMPLANT
INTERBODY SABLE 10X26 7-14 15D (Miscellaneous) ×1 IMPLANT
KIT BASIN OR (CUSTOM PROCEDURE TRAY) ×3 IMPLANT
KIT POSITION SURG JACKSON T1 (MISCELLANEOUS) ×3 IMPLANT
KIT TURNOVER KIT B (KITS) ×3 IMPLANT
NDL SPNL 18GX3.5 QUINCKE PK (NEEDLE) ×2 IMPLANT
NEEDLE 22X1 1/2 (OR ONLY) (NEEDLE) ×3 IMPLANT
NEEDLE SPNL 18GX3.5 QUINCKE PK (NEEDLE) ×2 IMPLANT
NS IRRIG 1000ML POUR BTL (IV SOLUTION) ×3 IMPLANT
PACK LAMINECTOMY ORTHO (CUSTOM PROCEDURE TRAY) ×3 IMPLANT
PAD ARMBOARD 7.5X6 YLW CONV (MISCELLANEOUS) ×6 IMPLANT
PATTIES SURGICAL .75X.75 (GAUZE/BANDAGES/DRESSINGS) ×3 IMPLANT
PATTIES SURGICAL 1X1 (DISPOSABLE) ×3 IMPLANT
ROD 40MM SPINAL (Rod) ×1 IMPLANT
SCREW MOD CREO 7.5X35 (Screw) ×1 IMPLANT
SCREW MOD SD CREO 7.5X40 (Screw) ×2 IMPLANT
SCREW MOD SD CREO 7.5X50 (Screw) ×2 IMPLANT
SCREW PA THRD CREO TULIP 5.5X4 (Head) ×4 IMPLANT
SPOGE SURGIFLO 8M (HEMOSTASIS)
SPONGE SURGIFLO 8M (HEMOSTASIS) IMPLANT
SPONGE SURGIFOAM ABS GEL 100 (HEMOSTASIS) ×3 IMPLANT
STAPLER VISISTAT 35W (STAPLE) ×1 IMPLANT
STRIP CLOSURE SKIN 1/2X4 (GAUZE/BANDAGES/DRESSINGS) ×2 IMPLANT
SUT VIC AB 0 CT1 27 (SUTURE) ×4
SUT VIC AB 0 CT1 27XBRD ANBCTR (SUTURE) ×2 IMPLANT
SUT VIC AB 1 CT1 27 (SUTURE) ×2
SUT VIC AB 1 CT1 27XBRD ANBCTR (SUTURE) IMPLANT
SUT VIC AB 1 CTX 27 (SUTURE) ×1 IMPLANT
SUT VIC AB 1 CTX 36 (SUTURE) ×4
SUT VIC AB 1 CTX36XBRD ANBCTR (SUTURE) ×4 IMPLANT
SUT VIC AB 2-0 CT1 27 (SUTURE) ×6
SUT VIC AB 2-0 CT1 TAPERPNT 27 (SUTURE) ×2 IMPLANT
SUT VIC AB 3-0 X1 27 (SUTURE) ×3 IMPLANT
SYR 20ML LL LF (SYRINGE) ×3 IMPLANT
SYR CONTROL 10ML LL (SYRINGE) ×6 IMPLANT
TAP BONE CORT 5.0/4.0 (BIT) ×1 IMPLANT
TAP BONE CORT 5.5/4.5 (TAP) ×1 IMPLANT
TAP CORT GLBU 6.5/5.5 (TAP) ×1 IMPLANT
TAP CORT GLBU 7.5/6.5 (TAP) ×1 IMPLANT
TOWEL GREEN STERILE (TOWEL DISPOSABLE) ×3 IMPLANT
TOWEL GREEN STERILE FF (TOWEL DISPOSABLE) ×3 IMPLANT
TRAY FOLEY MTR SLVR 16FR STAT (SET/KITS/TRAYS/PACK) ×3 IMPLANT
WATER STERILE IRR 1000ML POUR (IV SOLUTION) ×3 IMPLANT
YANKAUER SUCT BULB TIP NO VENT (SUCTIONS) ×3 IMPLANT

## 2021-09-22 NOTE — Anesthesia Postprocedure Evaluation (Signed)
Anesthesia Post Note  Patient: Autumn Rose  Procedure(s) Performed: RIGHT L5-S1 TRANSFORAMINAL LUMBAR INTERBODY FUSION WITH SCREWS, RODS AND CAGE, LOCAL BONE GRAFT, ALLOGRAFT BONE GRAFT AND VIVIGEN (Spine Lumbar)     Patient location during evaluation: PACU Anesthesia Type: General Level of consciousness: awake and alert and oriented Pain management: pain level controlled Vital Signs Assessment: post-procedure vital signs reviewed and stable Respiratory status: spontaneous breathing, nonlabored ventilation and respiratory function stable Cardiovascular status: blood pressure returned to baseline and stable Postop Assessment: no apparent nausea or vomiting Anesthetic complications: no   No notable events documented.  Last Vitals:  Vitals:   09/22/21 1250 09/22/21 1305  BP: 96/70 111/62  Pulse: (!) 106 92  Resp: 14 12  Temp: 36.9 C   SpO2: 95% 96%    Last Pain:  Vitals:   09/22/21 1305  TempSrc:   PainSc: Asleep                 Tai Skelly A.

## 2021-09-22 NOTE — Anesthesia Procedure Notes (Signed)
Procedure Name: Intubation Date/Time: 09/22/2021 8:50 AM Performed by: Lovie Chol, CRNA Pre-anesthesia Checklist: Patient identified, Emergency Drugs available, Suction available and Patient being monitored Patient Re-evaluated:Patient Re-evaluated prior to induction Oxygen Delivery Method: Circle System Utilized Preoxygenation: Pre-oxygenation with 100% oxygen Induction Type: IV induction and Cricoid Pressure applied Ventilation: Mask ventilation without difficulty Laryngoscope Size: Miller and 2 Grade View: Grade I Tube type: Oral Tube size: 7.0 mm Number of attempts: 1 Airway Equipment and Method: Oral airway and Bougie stylet Placement Confirmation: ETT inserted through vocal cords under direct vision, positive ETCO2 and breath sounds checked- equal and bilateral Secured at: 21 cm Tube secured with: Tape Dental Injury: Teeth and Oropharynx as per pre-operative assessment  Comments: Smoth IV induction. Easy mask. DL x1 Mil 2. Grade 1 view with cricoid. Mouth and airway anatomy small. Unable to pass ETT. Bougie stylet easily passed through cords and 7.0ETT passed with ease through cords over bougie. +ETCO2 BBSE.

## 2021-09-22 NOTE — Op Note (Addendum)
09/22/2021  12:26 PM  PATIENT:  Autumn Rose  56 y.o. female  MRN: [id number]  OPERATIVE REPORT  PRE-OPERATIVE DIAGNOSIS:  L5-S1 spondylolisthesis with right lumbar radiculopathy  POST-OPERATIVE DIAGNOSIS:  L5-S1 spondylolisthesis with right lumbar radiculopathy  PROCEDURE:  Procedure(s): RIGHT L5-S1 TRANSFORAMINAL LUMBAR INTERBODY FUSION WITH SCREWS, RODS AND CAGE, LOCAL BONE GRAFT, ALLOGRAFT BONE GRAFT AND VIVIGEN    SURGEON:  Autumn Oto, MD     ASSISTANT:  Benjiman Core, PA-C  (Present throughout the entire procedure and necessary for completion of procedure in a timely manner)     ANESTHESIA:  General, Dr. Josephine Rose, supplemented with local marcaine 0.5% 1:1 exparel 1.3% total 30cc placed. Patient was also give 1 gram of TXA intraoperatively.    COMPLICATIONS:  None.     COMPONENTS:  Implant Name Type Inv. Item Serial No. Manufacturer Lot No. LRB No. Used Action  GRAFT BONE CANNULA VIVIGEN 3 - [id number] Bone Implant GRAFT BONE CANNULA VIVIGEN 3 [id number] [employer]  N/A 1 Implanted  GRAFT BONE CANNULA VIVIGEN 3 - [id number] Bone Implant GRAFT BONE CANNULA VIVIGEN 3 [id number] [employer]  N/A 1 Implanted  GRAFT BONE CANNULA VIVIGEN 3 - [id number] [id number] Bone Implant GRAFT BONE CANNULA VIVIGEN 3 [id number] [employer]  N/A 1 Implanted  INTERBODY SABLE 10X26 7-14 15D - [id number] Miscellaneous INTERBODY SABLE 10X26 7-14 15D  [employer] [id number] N/A 1 Implanted  SCREW MOD SD CREO 7.5X40 - [id number] Screw SCREW MOD SD CREO 7.5X40  [employer]  N/A 1 Implanted  SCREW MOD SD CREO 7.5X40 - [id number] Screw SCREW MOD SD CREO 7.5X40  [employer]  N/A 1 Implanted and Explanted  SCREW PA THRD CREO TULIP 5.5X4 - [id number] Head SCREW PA THRD CREO TULIP 5.5X4  [employer]  N/A 4 Implanted  SCREW MOD SD CREO 7.5X50 - [id number] Screw SCREW MOD SD CREO 7.5X50  [employer]  N/A 2 Implanted  CAP LOCKING THREADED - [id number] Cap CAP LOCKING THREADED   [employer]  N/A 4 Implanted  INTERBODY SABLE 10X26 7-14 15D - [id number] Miscellaneous INTERBODY SABLE 10X26 7-14 15D  [employer]  N/A 1 Implanted  ROD 40MM SPINAL - [id number] Rod ROD 40MM SPINAL  [employer]  N/A 1 Implanted  CREO ONE 07.5 X 35MM ROBOTIC, SELF-DRILLING, MODULAR      N/A 1 Implanted     PROCEDURE: The patient was met in the holding area, and the appropriate right L5-S1 level was identified and marked with an x and my initials.The patient was then transported to OR. The patient was then placed under  general anesthesia without difficulty.The patient received appropriate preoperative antibiotic prophylaxis ancef.   Nursing staff inserted a Foley catheter under sterile conditions. She was then turned to a prone position Whitsett spine frame was used for this case. All pressure points were well padded PAS stocking applied bilateral lower extremity to prevent DVT. Standard prep DuraPrep solution from the lower dorsal spine to the mid sacral level. Draped in the usual manner. Time-out procedure was called and correct .  The incision made L4to S2 and carried superiorly to the superior margin of the L4 spinous process. Bovie electric cautery was used to control bleeding and carefully dissection was carried down along the lateral aspects of the spinous process of L3 toS1. Cobb then used to carefully elevate the paralumbar muscle and the incision in the midline was carried to to the level of the base of the residual spinous processes.  Clamps were then inserted between  the spinous processes of L4 and L5  and at the S1 spinous process and intraoperative lateral confirmation of the appropriate level with C-arm sterilely draped. Osteotome was then used to resect the inferior articular process of L 5 removing with 3 mm Kerrison and Penfield 4 and pituitary rongeur. The interspinous ligament was then resected between L4-5 and L5-S1. Loupe magnification and headlight were used for this portion of  the procedure.  C-arm fluoroscopy was then brought into the field and using C-arm fluoroscopy then a hole made into the medial aspect of the left L5 pedicle  using a high speed burr observed in the pedicle using C arm at the 5 oclock position on the left L5 pedicle nerve probe initial entry was determined on fluoroscopy to be good position alignment so that a 4.5 mm tap was passed to 50 mm within the left L5 pedicle to a depth of nearly 50 mm observed on C-arm fluoroscopy to be beyond the midpoint of the lumbar vertebra and then position alignment within the left L5 pedicle this was then removed and the pedicle channel probed demonstrating patency no sign of rupture the cortex of the pedicle. Tapping with a 4.5 mm screw tap then 5.5 mm tap then a 6.84mm and 7.58mm tap a 7.5 mm x 50 mm screw was placed on the left side pedicle at the L5 level. C-arm fluoroscopy was then brought into the field and using C-arm fluoroscopy then a hole made into the posterior medial aspect of the pedicle of right L5 observed in the pedicle using ball tipped nerve hook and hockey stick nerve probe initial entry was determined on fluoroscopy to be good position alignment so that 4.5 mm tap was then used to tap the right L5 pedicle to a depth of nearly 50 mm observed on C-arm fluoroscopy to be beyond the midpoint of the lumbar vertebra and then position alignment within the right L5 pedicle this was then removed and the pedicle channel probed demonstrating patency no sign of rupture the cortex of the pedicle. Tapping with a 5.5 mm screw tap then tapping with a 6.5 mm and 7.5 mm tap, a 7.5 mm x 50 mm screw shank was placed on the left side at L5.C-arm fluoroscopy was then brought into the field and using C-arm fluoroscopy then a hole made into the posterior and medial aspect of the left pedicle of S1 observed in the pedicle using ball tipped nerve hook and hockey stick nerve probe initial entry was determined on fluoroscopy to be good  position alignment so that a 4.5 mm tap was then used to tap the left S1 pedicle to a depth of nearly 40 mm observed on C-arm fluoroscopy to be beyond the posterior one third of the lumbar vertebra and good position alignment within the left S1 pedicle this was then removed and the pedicle channel probed demonstrating patency no sign of rupture the cortex of the pedicle. Tapping with a 4.5 mm screw tap then a 5.5 mm tap then placing a 7.34mm x 40 mm screw on the left side at the S1 level. The pedicle channel of S1 on the left probed demonstrating patency no sign of rupture the cortex of the pedicle.C-arm fluoroscopy was then brought into the field and using C-arm fluoroscopy then a hole made into the posterior and medial aspect of the right pedicle of S1 observed in the pedicle using ball tipped nerve hook and hockey stick nerve probe initial entry was determined on fluoroscopy to be good  position alignment so that a 4.44mm tap was then used to tap the right S1 pedicle to a depth of nearly 40 mm observed on C-arm fluoroscopy to be beyond the posterior one third of the lumbar vertebra and good position alignment within the right S1 pedicle this was then removed and the pedicle channel probed demonstrating patency no sign of rupture the cortex of the pedicle. Tapping with a 4.66mm screw tap then up to a 5.57mm tap then 7.31mm x 40 mm screw was placed on the right side at the S1 level. The pedicle channel of S1 on the right probed demonstrating patency no sign of rupture the cortex of the pedicle. Corticotrajectory screw for fixation of this level was measured as 7.5 mm x 40 mm screw shank inserted.  The right S1  Pedicle screw was found to be broaching the right S1 pedicle cortex inferomedially so it was removed and a pedicle screw  placed more cranially and lateral within the right S1 pedicle. There right side thecal sac, S1 and S2 nerve roots were not affected by the screw broaching the medial pedile With laminectomy  the pedicle was seen to be intact medially and inferior. The previous S1 pedicle screw opening was bone grafted with local bone graft that had been harvested from laminectomy at this level. C-Arm was used to examine the screw placement. Additionally prior-insertion of the right S1 pedicle screw the anterior sacral cortex was penetrated with a pedicle awl and then a 7.59mm x 35 mm screw placed. C-arm demonstrating the screw to be well placed on the right side S1.  3 mm Kerrison used to carefully free of the ligamentum flavum attachment off the superior aspect of the lamina of S1 and inferior aspect of lamina of L5 and the superior aspect of L5. Facet capsules at L5-S1 were resect in the neural arch resected using Leksell rongeur as well as Kerrison removing the larger portions of the spinous process, The supraspinous ligment was detached from the spinous process of L5, central portions of the lamina small amount of residual or and inferior articular process from a which were carefully resected using Kerrisons. Ligamentum flavum at the L4-5 level was then carried superiorly off the attachments to the volar aspect of the inferior aspect of the lamina of L4 and the medial aspect of the L4-5 facet. Decompression was then carried out over both L5 neuroforamen using a 3 mm Kerrisons left side neuroforamen remained quite narrowed secondary to listhesis of the L5 vertebral body with a L5 pedicle protein the superior aspect of the intervertebral body of S1. Medial superior articular process of S1 was then carefully resect using osteotomes and Kerrison so that the L5-S1 disc was exposed bipolar electrocautery used carefully cauterized vascular leash within the axillary area the L5 nerve on the left side.  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 The left L 5 nerve root well decompressed. Hockey-stick nerve probe was it will be passed out the left L 5  neuroforamen and identifying the superior aspect of the S1 pedicle then osteotome used to resect the superior articular process of S1 transversely at the superior level of the S1 pedicle. This allowed for a wide decompression left L5 neuroforamen. Left S 1 nerve roots and lateral aspect of the thecal sac at the L5-S1 level was then able to be mobilized and Penfield 4 disc space the L5-S1 level identified and the nerve structures retracted with Derricho  retractors. 15 blade scalpel used to incise the disc the left side. Pituitary rongeur and used to debride degenerative spur on the disc space the left side. The right L5-S1 disc space entered with an osteotome resecting a small portion of bone off the posterior superior lip of the S1 vertebra superior to the right S1 pedicle. A discectomy on the right side was performed. With debridement of degenerative disc material from the left side L5-S1 disc, the disc space was dilated using 4 mm through 8 mm disc space dilators.The disc space was then prepared using straight curette up-biting right and up-biting left and right curettes and ring curettes. Degenerated disc as well as a cartilage endplates  debrided from the disc using pituitary rongeurs. The disc space examined demonstrating good bleeding endplate bone surfaces. Bone graft that was harvested from the L5 neural arch was placed into the intervertebral disc space after first sounding the disc space for the correct cage size. Cage chosen was a 7-78mm adjustable 14 degree lordotic Globus Sable cage. Additional bone graft was harvested from the facet areas on both sides.  Local bone graft was then impacted into the intervertebral disc space at  L5-S1 first placing bone graft within the disc space using a forceps then impacted with an 7mm trial cage. After performing this 2 times stem the permanent 17-14 mm Globus 14 degree adjustable lordotic Sable cage packed with bone graft was then inserted in approximately 15-20  of convergence. Careful inspection of the spinal canal demonstrated no surgery bone graft within the spinal canal both the L 5 and S1 nerve roots appeared to exit without further there  was disc herniation of the posterior aspect of the space to the left L5 neuroforamen  C-arm images were used to confirm the trial size for the permanent cage implant as well as positioning of the implant well within the intervertebral disc this cage was subset deep to the posterior aspect of the disc some 2-3 mm.The cage then deployed to the maximum height.  Attention then turned to placement of the 2 tulips on the right side pedicle screw shanks heads and insertion of 40 mm precontoured rod the L5-S1level.    A precontoured 40 mm rod was then carefully inserted into the fasteners extending from L5-S1 on the left side. The fastener caps at L5 and S1 level was then placed and torqued to 80 foot-pounds with compression between the fasteners L5 and S1 was then performed. Cap at the L5 level was tightened to 80 foot pounds. Attention then turned to the placement of the pedicle screw on the left side at S1 this was done similar to the left side. A 40 mm precontoured rod was then inserted into the fasteners extending from L5-S1 the left side. Fastener caps then applied to the L 5 and S1 pedicle screw fasteners and these were tightened to 80 foot-pounds.  The fastener cap at the L5 level was then inserted and compression obtained between the fastener at L 5 and at S1 and then tightened to 80 foot-pounds. Irrigation was carried out using copious amounts of irrigant solution. Thrombin-soaked Gelfoam were placed for hemostasis was then carefully removed. Permanent C-arm images were obtained in AP oblique and lateral positions for documentation purposes. They showed the pedicle screws rods to be in good position alignment from L5-S1. Spondylolisthesis at L5-S1 was improved. Cell Saver was used during this case, the patient received 1 gram  TXA intraoperatively.. Estimated blood loss was 200 cc. The deep paralumbar muscles  were approximated with 1 Vicryl sutures, the lumbodorsal fascia approximated with  1 Vicryl sutures. Subcutaneous layers were then approximated with interrupted 0 and 2-0 Vicryl sutures , skin closed with a running subcuticular 4-0 Vicryl suture. Mepilex bandage was then applied. The exiting Hemovac drain site  carefully bandaged. All instrument and sponge counts were correct. Patient was then returned to the supine position reactivated extubated and returned to the recovery room in satisfactory condition. Benjiman Core PA-C perform the duties of assistant surgeon during this case. he was present from the beginning of the case to the end of the case assisting in transfer the patient from his stretcher to the OR table and back to the stretcher at the end of the case. Assisted in careful retraction and suction of the laminectomy site delicate neural structures operating under the operating room microscope. he performed closure of the incision from the fascia to the skin applying the dressing.     Basil Dess  09/22/2021, 12:26 PM

## 2021-09-22 NOTE — Progress Notes (Signed)
Orthopedic Tech Progress Note Patient Details:  Autumn Rose 07/07/1966 [id number]  Ortho Devices Type of Ortho Device: Lumbar corsett Ortho Device/Splint Location: BACK Ortho Device/Splint Interventions: Ordered   Post Interventions Patient Tolerated: Well Instructions Provided: Care of device  Janit Pagan 09/22/2021, 2:24 PM

## 2021-09-22 NOTE — Brief Op Note (Addendum)
09/22/2021  12:22 PM  PATIENT:  Billy Cletis Media  56 y.o. female  PRE-OPERATIVE DIAGNOSIS:  L5-S1 spondylolisthesis with right lumbar radiculopathy  POST-OPERATIVE DIAGNOSIS:  L5-S1 spondylolisthesis with right lumbar radiculopathy  PROCEDURE:  Procedure(s): RIGHT L5-S1 TRANSFORAMINAL LUMBAR INTERBODY FUSION WITH SCREWS, RODS AND CAGE, LOCAL BONE GRAFT, ALLOGRAFT BONE GRAFT AND VIVIGEN (N/A)  SURGEON:  Surgeon(s) and Role:    * Jessy Oto, MD - Primary  PHYSICIAN ASSISTANT: Esaw Grandchild  ANESTHESIA:   local and general, Dr. Josephine Igo.   EBL: 200cc  BLOOD ADMINISTERED:none  DRAINS: Urinary Catheter (Foley)   LOCAL MEDICATIONS USED:  MARCAINE 0.5% 1:1 EXPAREL 1.3%  Amount: 30 ml  SPECIMEN:  No Specimen  DISPOSITION OF SPECIMEN:  N/A  COUNTS:  YES  TOURNIQUET:  * No tourniquets in log *  DICTATION: .Dragon Dictation  PLAN OF CARE: Admit for overnight observation  PATIENT DISPOSITION:  PACU - hemodynamically stable.   Delay start of Pharmacological VTE agent (>24hrs) due to surgical blood loss or risk of bleeding: yes

## 2021-09-22 NOTE — Interval H&P Note (Signed)
History and Physical Interval Note:  09/22/2021 7:35 AM  Autumn Rose  has presented today for surgery, with the diagnosis of L5-S1 spondylolisthesis with right lumbar radiculopathy.  The various methods of treatment have been discussed with the patient and family. After consideration of risks, benefits and other options for treatment, the patient has consented to  Procedure(s): RIGHT L5-S1 TRANSFORAMINAL LUMBAR INTERBODY FUSION WITH SCREWS, RODS AND CAGE, LOCAL BONE GRAFT, ALLOGRAFT BONE GRAFT AND VIVIGEN (N/A) as a surgical intervention.  The patient's history has been reviewed, patient examined, no change in status, stable for surgery.  I have reviewed the patient's chart and labs.  Questions were answered to the patient's satisfaction.     Vira Browns

## 2021-09-22 NOTE — Transfer of Care (Signed)
Immediate Anesthesia Transfer of Care Note  Patient: Autumn Rose  Procedure(s) Performed: RIGHT L5-S1 TRANSFORAMINAL LUMBAR INTERBODY FUSION WITH SCREWS, RODS AND CAGE, LOCAL BONE GRAFT, ALLOGRAFT BONE GRAFT AND VIVIGEN (Spine Lumbar)  Patient Location: PACU  Anesthesia Type:General  Level of Consciousness: oriented, drowsy and patient cooperative  Airway & Oxygen Therapy: Patient Spontanous Breathing and Patient connected to nasal cannula oxygen  Post-op Assessment: Report given to RN and Post -op Vital signs reviewed and stable  Post vital signs: Reviewed  Last Vitals:  Vitals Value Taken Time  BP 96/70 09/22/21 1250  Temp    Pulse 93 09/22/21 1251  Resp 13 09/22/21 1251  SpO2 96 % 09/22/21 1251  Vitals shown include unvalidated device data.  Last Pain:  Vitals:   09/22/21 0634  TempSrc:   PainSc: 0-No pain         Complications: No notable events documented.

## 2021-09-22 NOTE — Discharge Instructions (Addendum)

## 2021-09-23 ENCOUNTER — Other Ambulatory Visit: Payer: Self-pay

## 2021-09-23 DIAGNOSIS — M4317 Spondylolisthesis, lumbosacral region: Secondary | ICD-10-CM | POA: Diagnosis not present

## 2021-09-23 LAB — BASIC METABOLIC PANEL
Anion gap: 8 (ref 5–15)
BUN: 12 mg/dL (ref 6–20)
CO2: 27 mmol/L (ref 22–32)
Calcium: 8.9 mg/dL (ref 8.9–10.3)
Chloride: 102 mmol/L (ref 98–111)
Creatinine, Ser: 0.91 mg/dL (ref 0.44–1.00)
GFR, Estimated: >60 mL/min (ref >60)
Glucose, Bld: 117 mg/dL — ABNORMAL HIGH (ref 70–99)
Potassium: 4 mmol/L (ref 3.5–5.1)
Sodium: 137 mmol/L (ref 135–145)

## 2021-09-23 LAB — CBC
HCT: 29.5 % — ABNORMAL LOW (ref 36.0–46.0)
Hemoglobin: 9.9 g/dL — ABNORMAL LOW (ref 12.0–15.0)
MCH: 29.2 pg (ref 26.0–34.0)
MCHC: 33.6 g/dL (ref 30.0–36.0)
MCV: 87 fL (ref 80.0–100.0)
Platelets: 253 10*3/uL (ref 150–400)
RBC: 3.39 MIL/uL — ABNORMAL LOW (ref 3.87–5.11)
RDW: 12.7 % (ref 11.5–15.5)
WBC: 12.2 10*3/uL — ABNORMAL HIGH (ref 4.0–10.5)
nRBC: 0 % (ref 0.0–0.2)

## 2021-09-23 MED ORDER — FERROUS GLUCONATE 324 (38 FE) MG PO TABS: 324.0000 mg | ORAL_TABLET | Freq: Two times a day (BID) | ORAL | 1 refills | Status: DC

## 2021-09-23 MED ORDER — GABAPENTIN 300 MG PO CAPS: 300.0000 mg | ORAL_CAPSULE | Freq: Three times a day (TID) | ORAL | 1 refills | Status: DC

## 2021-09-23 MED ORDER — DOCUSATE SODIUM 100 MG PO CAPS: 100.0000 mg | ORAL_CAPSULE | Freq: Two times a day (BID) | ORAL | 1 refills | Status: DC

## 2021-09-23 MED ORDER — HYDROCODONE-ACETAMINOPHEN 7.5-325 MG PO TABS: 1.0000 | ORAL_TABLET | Freq: Four times a day (QID) | ORAL | 0 refills | Status: DC | PRN

## 2021-09-23 MED ORDER — METHOCARBAMOL 500 MG PO TABS: 500.0000 mg | ORAL_TABLET | Freq: Four times a day (QID) | ORAL | 1 refills | Status: DC | PRN

## 2021-09-23 MED FILL — Thrombin For Soln Kit 20000 Unit: CUTANEOUS | Qty: 1 | Status: AC

## 2021-09-23 NOTE — Progress Notes (Signed)
OT Cancellation Note  Patient Details Name: CECILIE HEIDEL MRN: [id number] DOB: June 30, 1966   Cancelled Treatment:    Reason Eval/Treat Not Completed: OT screened, no needs identified, will sign off (Per PT pt has LB AE, and compensatory techniques for ADLs to adhere to back precautions. No OT needs actuely or at d/c, thank you.)  Lelon Mast A Jenavee Laguardia 09/23/2021, 8:25 AM

## 2021-09-23 NOTE — Progress Notes (Addendum)
° ° ° °  Subjective: 1 Day Post-Op Procedure(s) (LRB): RIGHT L5-S1 TRANSFORAMINAL LUMBAR INTERBODY FUSION WITH SCREWS, RODS AND CAGE, LOCAL BONE GRAFT, ALLOGRAFT BONE GRAFT AND VIVIGEN (N/A) Awake, alert and oriented x 4. Voiding without difficulty. Ambulating in the Hallway. Dressing changed to a Agcell, and it is nearly falling off please change prior to discharge.   Patient reports pain as moderate.    Objective:   VITALS:  Temp:  [98.4 F (36.9 C)-98.7 F (37.1 C)] 98.4 F (36.9 C) (02/15 0744) Pulse Rate:  [83-109] 96 (02/15 0744) Resp:  [8-20] 16 (02/15 0744) BP: (93-123)/(56-82) 105/68 (02/15 0744) SpO2:  [93 %-100 %] 98 % (02/15 0744)  Neurologically intact ABD soft Neurovascular intact Sensation intact distally Intact pulses distally Dorsiflexion/Plantar flexion intact Incision: dressing C/D/I and dressing is loose and needs change prior to discharge.    LABS Recent Labs    09/23/21 0727  HGB 9.9*  WBC 12.2*  PLT 253   Recent Labs    09/23/21 0727  NA 137  K 4.0  CL 102  CO2 27  BUN 12  CREATININE 0.91  GLUCOSE 117*   No results for input(s): LABPT, INR in the last 72 hours.   Assessment/Plan: 1 Day Post-Op Procedure(s) (LRB): RIGHT L5-S1 TRANSFORAMINAL LUMBAR INTERBODY FUSION WITH SCREWS, RODS AND CAGE, LOCAL BONE GRAFT, ALLOGRAFT BONE GRAFT AND VIVIGEN (N/A)  Advance diet Up with therapy D/C IV fluids Discharge home home health not necessary per PT.   Vira Browns 09/23/2021, 8:55 AM Patient ID: Trula Slade, female   DOB: 02/21/1966, 56 y.o.   MRN: [id number]

## 2021-09-23 NOTE — Plan of Care (Signed)
Pt doing well. Pt given D/C instructions with verbal understanding. Rx's were sent to the pharmacy by MD. Pt's incision is clean and dry with no sign of infection. Pt's IV was removed prior to D/C. Pt D/C'd home via wheelchair per MD order. Pt is stable @ D/C and has no other needs at this time. Demir Titsworth, RN  

## 2021-09-23 NOTE — Evaluation (Signed)
Physical Therapy Evaluation & Discharge Patient Details Name: Autumn Rose MRN: [id number] DOB: 1965/09/05 Today's Date: 09/23/2021  History of Present Illness  56 y/o female admitted on 09/22/21 following R L5-S1 transforaminal lumbar fusion. No significant PMH.  Clinical Impression  Patient admitted following above procedure. Patient functioning at modI level with no AD for all mobility. Educated patient on back precautions, brace wear, adaptive equipment, and progressive walking program, patient verbalized and demonstrated understanding. Patient will have husband to assist at home at discharge. No further skilled PT needs identified acutely. No PT follow up recommended at this time.        Recommendations for follow up therapy are one component of a multi-disciplinary discharge planning process, led by the attending physician.  Recommendations may be updated based on patient status, additional functional criteria and insurance authorization.  Follow Up Recommendations No PT follow up    Assistance Recommended at Discharge Set up Supervision/Assistance  Patient can return home with the following       Equipment Recommendations None recommended by PT  Recommendations for Other Services       Functional Status Assessment Patient has not had a recent decline in their functional status     Precautions / Restrictions Precautions Precautions: Back Precaution Booklet Issued: Yes (comment) Required Braces or Orthoses: Spinal Brace Spinal Brace: Lumbar corset;Applied in standing position Restrictions Weight Bearing Restrictions: No      Mobility  Bed Mobility Overal bed mobility: Modified Independent                  Transfers Overall transfer level: Modified independent Equipment used: None                    Ambulation/Gait Ambulation/Gait assistance: Modified independent (Device/Increase time) Gait Distance (Feet): 400 Feet Assistive device: None Gait  Pattern/deviations: WFL(Within Functional Limits) Gait velocity: decreased        Stairs Stairs: Yes Stairs assistance: Modified independent (Device/Increase time) Stair Management: One rail Left, Alternating pattern, Forwards Number of Stairs: 2    Wheelchair Mobility    Modified Rankin (Stroke Patients Only)       Balance Overall balance assessment: No apparent balance deficits (not formally assessed)                                           Pertinent Vitals/Pain Pain Assessment Pain Assessment: Faces Faces Pain Scale: Hurts little more Pain Location: back Pain Descriptors / Indicators: Grimacing Pain Intervention(s): Monitored during session    Home Living Family/patient expects to be discharged to:: Private residence Living Arrangements: Spouse/significant other Available Help at Discharge: Family;Available 24 hours/day Type of Home: House Home Access: Stairs to enter Entrance Stairs-Rails: None Entrance Stairs-Number of Steps: 1   Home Layout: One level Home Equipment: Agricultural consultant (2 wheels);BSC/3in1;Adaptive equipment;Cane - single point      Prior Function Prior Level of Function : Independent/Modified Independent                     Hand Dominance        Extremity/Trunk Assessment   Upper Extremity Assessment Upper Extremity Assessment: Overall WFL for tasks assessed    Lower Extremity Assessment Lower Extremity Assessment: Overall WFL for tasks assessed    Cervical / Trunk Assessment Cervical / Trunk Assessment: Back Surgery  Communication   Communication: No difficulties  Cognition Arousal/Alertness:  Awake/alert Behavior During Therapy: WFL for tasks assessed/performed Overall Cognitive Status: Within Functional Limits for tasks assessed                                          General Comments      Exercises     Assessment/Plan    PT Assessment Patient does not need any further  PT services  PT Problem List         PT Treatment Interventions      PT Goals (Current goals can be found in the Care Plan section)  Acute Rehab PT Goals Patient Stated Goal: to go home PT Goal Formulation: All assessment and education complete, DC therapy    Frequency       Co-evaluation               AM-PAC PT "6 Clicks" Mobility  Outcome Measure Help needed turning from your back to your side while in a flat bed without using bedrails?: None Help needed moving from lying on your back to sitting on the side of a flat bed without using bedrails?: None Help needed moving to and from a bed to a chair (including a wheelchair)?: None Help needed standing up from a chair using your arms (e.g., wheelchair or bedside chair)?: None Help needed to walk in hospital room?: None Help needed climbing 3-5 steps with a railing? : None 6 Click Score: 24    End of Session Equipment Utilized During Treatment: Back brace Activity Tolerance: Patient tolerated treatment well Patient left: in bed;with call bell/phone within reach Nurse Communication: Mobility status PT Visit Diagnosis: Muscle weakness (generalized) (M62.81)    Time: 6834-1962 PT Time Calculation (min) (ACUTE ONLY): 17 min   Charges:   PT Evaluation $PT Eval Low Complexity: 1 Low          Raelee Rossmann A. Dan Humphreys PT, DPT Acute Rehabilitation Services Pager [phone] Office [phone]   Viviann Spare 09/23/2021, 8:19 AM

## 2021-09-26 ENCOUNTER — Other Ambulatory Visit: Payer: Self-pay

## 2021-09-26 ENCOUNTER — Encounter (HOSPITAL_BASED_OUTPATIENT_CLINIC_OR_DEPARTMENT_OTHER): Payer: Self-pay

## 2021-09-26 ENCOUNTER — Emergency Department (HOSPITAL_BASED_OUTPATIENT_CLINIC_OR_DEPARTMENT_OTHER): Payer: BC Managed Care – PPO

## 2021-09-26 ENCOUNTER — Emergency Department (HOSPITAL_BASED_OUTPATIENT_CLINIC_OR_DEPARTMENT_OTHER)
Admission: EM | Admit: 2021-09-26 | Discharge: 2021-09-26 | Disposition: A | Discharge status: Home / Self Care | Payer: BC Managed Care – PPO | Attending: Emergency Medicine | Admitting: Emergency Medicine

## 2021-09-26 DIAGNOSIS — M5441 Lumbago with sciatica, right side: Secondary | ICD-10-CM | POA: Diagnosis not present

## 2021-09-26 DIAGNOSIS — M545 Low back pain, unspecified: Secondary | ICD-10-CM | POA: Diagnosis present

## 2021-09-26 DIAGNOSIS — M5442 Lumbago with sciatica, left side: Secondary | ICD-10-CM | POA: Diagnosis not present

## 2021-09-26 DIAGNOSIS — G8918 Other acute postprocedural pain: Secondary | ICD-10-CM | POA: Insufficient documentation

## 2021-09-26 LAB — PREGNANCY, URINE: Preg Test, Ur: NEGATIVE

## 2021-09-26 LAB — BASIC METABOLIC PANEL WITH GFR
Anion gap: 6 (ref 5–15)
BUN: 8 mg/dL (ref 6–20)
CO2: 29 mmol/L (ref 22–32)
Calcium: 9.5 mg/dL (ref 8.9–10.3)
Chloride: 101 mmol/L (ref 98–111)
Creatinine, Ser: 0.66 mg/dL (ref 0.44–1.00)
GFR, Estimated: >60 mL/min
Glucose, Bld: 130 mg/dL — ABNORMAL HIGH (ref 70–99)
Potassium: 3.9 mmol/L (ref 3.5–5.1)
Sodium: 136 mmol/L (ref 135–145)

## 2021-09-26 LAB — CBC WITH DIFFERENTIAL/PLATELET
Abs Immature Granulocytes: 0.03 10*3/uL (ref 0.00–0.07)
Basophils Absolute: 0 10*3/uL (ref 0.0–0.1)
Basophils Relative: 0 %
Eosinophils Absolute: 0.4 10*3/uL (ref 0.0–0.5)
Eosinophils Relative: 5 %
HCT: 32.6 % — ABNORMAL LOW (ref 36.0–46.0)
Hemoglobin: 11 g/dL — ABNORMAL LOW (ref 12.0–15.0)
Immature Granulocytes: 0 %
Lymphocytes Relative: 17 %
Lymphs Abs: 1.3 10*3/uL (ref 0.7–4.0)
MCH: 29.4 pg (ref 26.0–34.0)
MCHC: 33.7 g/dL (ref 30.0–36.0)
MCV: 87.2 fL (ref 80.0–100.0)
Monocytes Absolute: 0.7 10*3/uL (ref 0.1–1.0)
Monocytes Relative: 8 %
Neutro Abs: 5.3 10*3/uL (ref 1.7–7.7)
Neutrophils Relative %: 70 %
Platelets: 343 10*3/uL (ref 150–400)
RBC: 3.74 MIL/uL — ABNORMAL LOW (ref 3.87–5.11)
RDW: 12.7 % (ref 11.5–15.5)
WBC: 7.7 10*3/uL (ref 4.0–10.5)
nRBC: 0 % (ref 0.0–0.2)

## 2021-09-26 LAB — URINALYSIS, ROUTINE W REFLEX MICROSCOPIC
Bilirubin Urine: NEGATIVE
Glucose, UA: NEGATIVE mg/dL
Hgb urine dipstick: NEGATIVE
Ketones, ur: NEGATIVE mg/dL
Leukocytes,Ua: NEGATIVE
Nitrite: NEGATIVE
Protein, ur: NEGATIVE mg/dL
Specific Gravity, Urine: 1.015 (ref 1.005–1.030)
pH: 8.5 — ABNORMAL HIGH (ref 5.0–8.0)

## 2021-09-26 MED ORDER — SODIUM CHLORIDE 0.9 % IV SOLN: INTRAVENOUS | Status: DC | PRN

## 2021-09-26 MED ORDER — HYDROMORPHONE HCL 2 MG PO TABS: 2.0000 mg | ORAL_TABLET | Freq: Four times a day (QID) | ORAL | 0 refills | Status: DC | PRN

## 2021-09-26 MED ORDER — GADOBUTROL 1 MMOL/ML IV SOLN
7.5000 mL | Freq: Once | INTRAVENOUS | Status: AC | PRN
  Administered 2021-09-26: 7.5 mL via INTRAVENOUS

## 2021-09-26 MED ORDER — DIPHENHYDRAMINE HCL 50 MG/ML IJ SOLN
25.0000 mg | Freq: Once | INTRAMUSCULAR | Status: AC
  Administered 2021-09-26: 25 mg via INTRAVENOUS
  Filled 2021-09-26: qty 1

## 2021-09-26 MED ORDER — ONDANSETRON HCL 4 MG/2ML IJ SOLN
4.0000 mg | Freq: Once | INTRAMUSCULAR | Status: AC
  Administered 2021-09-26: 4 mg via INTRAVENOUS
  Filled 2021-09-26: qty 2

## 2021-09-26 MED ORDER — HYDROMORPHONE HCL 1 MG/ML IJ SOLN
1.0000 mg | Freq: Once | INTRAMUSCULAR | Status: AC
  Administered 2021-09-26: 1 mg via INTRAVENOUS
  Filled 2021-09-26: qty 1

## 2021-09-26 NOTE — Discharge Instructions (Addendum)
Contact Dr. Weston Brass: Monday to have them review the MRI.  Take the hydromorphone as additional pain supplementation.  MRI today without any significant evidence of any blood on the spinal cord or infection or any significant nerve root impingement.  Or any spinal cord impingement.

## 2021-09-26 NOTE — ED Triage Notes (Addendum)
Pt arrives in back brace to triage, states that she had spine surgery on Tuesday. Reports last night she started to have pain in her legs and back stating that she feels like her legs are on fire, pain radiates into her buttocks. Pt reports last BM was today first since last week, pt reports it was large in size.

## 2021-09-26 NOTE — ED Notes (Signed)
Patient transported to MRI 

## 2021-09-26 NOTE — ED Provider Notes (Addendum)
[facility] EMERGENCY DEPARTMENT Provider Note   CSN: [id number] Arrival date & time: 09/26/21  1621     History  Chief Complaint  Patient presents with   Leg Pain    Autumn Rose is a 56 y.o. female.  Patient status post back surgery by Dr. Otelia Sergeant on February 14.  They did an L5-S1 transforaminal foraminal lumbar interbody fusion with screws rods and cage and a bone graft.  Patient had the complaint of right lower extremity pain prior to the surgery.  Patient states that starting on Thursday started to get significant pain in both legs.  Got much worse here today.  Denies any numbness or weakness to the legs.  Patient states she is got lumbar back pain that radiates into both legs.  Patient states that the hydrocodone 7.5 and the Neurontin are not helping.  Past medical history otherwise noncontributory.  Past surgical history tubal ligation appendectomy breast implant removal and the back surgery as mentioned.      Home Medications Prior to Admission medications   Medication Sig Start Date End Date Taking? Authorizing Provider  Calcium Carbonate-Vitamin D (CALCIUM 600 + D PO) Take 1 tablet by mouth daily.      [provider]  docusate sodium (COLACE) 100 MG capsule Take 1 capsule (100 mg total) by mouth 2 (two) times daily. 09/23/21   Kerrin Champagne, MD  ferrous gluconate (FERGON) 324 MG tablet Take 1 tablet (324 mg total) by mouth 2 (two) times daily with a meal. 09/23/21   Kerrin Champagne, MD  fish oil-omega-3 fatty acids 1000 MG capsule Take 1 g by mouth daily.      [provider]  gabapentin (NEURONTIN) 300 MG capsule Take 1 capsule (300 mg total) by mouth 3 (three) times daily. 09/23/21   Kerrin Champagne, MD  HYDROcodone-acetaminophen (NORCO) 7.5-325 MG tablet Take 1-2 tablets by mouth every 6 (six) hours as needed for severe pain ((score 7 to 10)). 09/23/21   Kerrin Champagne, MD  methocarbamol (ROBAXIN) 500 MG tablet Take 1 tablet (500 mg total) by  mouth every 6 (six) hours as needed for muscle spasms. 09/23/21   Kerrin Champagne, MD  Multiple Vitamins-Minerals (MULTIVITAMIN WITH MINERALS) tablet Take 1 tablet by mouth daily.      [provider]  pantoprazole (PROTONIX) 40 MG tablet Take 40 mg by mouth 2 (two) times daily with a meal. 08/25/21   [provider]  phentermine (ADIPEX-P) 37.5 MG tablet Take 37.5 mg by mouth daily. 08/18/21   [provider]  sertraline (ZOLOFT) 100 MG tablet Take 50 mg by mouth daily. 11/23/19 09/15/21  [provider]      Allergies    Oxycodone and Tape    Review of Systems   Review of Systems  Constitutional:  Negative for chills and fever.  HENT:  Negative for ear pain and sore throat.   Eyes:  Negative for pain and visual disturbance.  Respiratory:  Negative for cough and shortness of breath.   Cardiovascular:  Negative for chest pain and palpitations.  Gastrointestinal:  Negative for abdominal pain and vomiting.  Genitourinary:  Negative for dysuria and hematuria.  Musculoskeletal:  Negative for arthralgias and back pain.  Skin:  Negative for color change and rash.  Neurological:  Positive for weakness. Negative for seizures, syncope and numbness.  All other systems reviewed and are negative.  Physical Exam Updated Vital Signs BP 111/77    Pulse 96  Temp 98.7 F (37.1 C) (Oral)    Resp 18    Ht 1.651 m (5\' 5" )    Wt 79.4 kg    SpO2 97%    BMI 29.12 kg/m  Physical Exam Vitals and nursing note reviewed.  Constitutional:      General: She is not in acute distress.    Appearance: Normal appearance. She is well-developed.  HENT:     Head: Normocephalic and atraumatic.  Eyes:     Extraocular Movements: Extraocular movements intact.     Conjunctiva/sclera: Conjunctivae normal.     Pupils: Pupils are equal, round, and reactive to light.  Cardiovascular:     Rate and Rhythm: Normal rate and regular rhythm.     Heart sounds: No murmur heard. Pulmonary:      Effort: Pulmonary effort is normal. No respiratory distress.     Breath sounds: Normal breath sounds.  Abdominal:     Palpations: Abdomen is soft.     Tenderness: There is no abdominal tenderness.  Musculoskeletal:        General: No swelling.     Cervical back: Normal range of motion and neck supple.     Comments: Lumbar wound without any erythema.  Original dressing still in place.  No discharge  Skin:    General: Skin is warm and dry.     Capillary Refill: Capillary refill takes less than 2 seconds.  Neurological:     General: No focal deficit present.     Mental Status: She is alert and oriented to person, place, and time.     Cranial Nerves: No cranial nerve deficit.     Sensory: No sensory deficit.     Motor: No weakness.  Psychiatric:        Mood and Affect: Mood normal.    ED Results / Procedures / Treatments   Labs (all labs ordered are listed, but only abnormal results are displayed) Labs Reviewed  URINALYSIS, ROUTINE W REFLEX MICROSCOPIC - Abnormal; Notable for the following components:      Result Value   pH 8.5 (*)    All other components within normal limits  CBC WITH DIFFERENTIAL/PLATELET - Abnormal; Notable for the following components:   RBC 3.74 (*)    Hemoglobin 11.0 (*)    HCT 32.6 (*)    All other components within normal limits  BASIC METABOLIC PANEL - Abnormal; Notable for the following components:   Glucose, Bld 130 (*)    All other components within normal limits  PREGNANCY, URINE    EKG None  Radiology MR Lumbar Spine W Wo Contrast (assess for abscess, cord compression)  Result Date: 09/26/2021 CLINICAL DATA:  Low back pain. Cauda equina syndrome suspected. Fusion surgery on 09/25/2021. Worsening back pain and leg weakness. EXAM: MRI LUMBAR SPINE WITHOUT AND WITH CONTRAST TECHNIQUE: Multiplanar and multiecho pulse sequences of the lumbar spine were obtained without and with intravenous contrast. CONTRAST:  7.45mL GADAVIST GADOBUTROL 1 MMOL/ML IV  SOLN COMPARISON:  Preoperative MRI 08/20/2021. Operative imaging 09/22/2021. FINDINGS: Segmentation: 5 lumbar type vertebral bodies as numbered previously. Alignment:  Fixed anterolisthesis at L5-S1 of 8 mm. Vertebrae:  No unexpected osseous finding. Conus medullaris and cauda equina: Conus extends to the L1-2 level. Conus and cauda equina appear normal. Paraspinal and other soft tissues: Nonenhancing fluid along the operative approach within the subcutaneous tissues, not unexpected in the setting of recent surgery. Disc levels: No disc level finding of significance at L3-4 or above. The patient does have what  is probably a subdural fluid collection with simple T2 bright fluid extending from L2-L5. Maximal thickness is 5.5 mm. This causes some displacement of the nerve structures within the thecal sac but does not result invisible neural compression. At L4-5, there is mild bulging of the disc as seen previously. At L5-S1, the patient has had posterior decompression, diskectomy and fusion. Interbody spacer in place. Pedicle screws and posterior rods. Fixed anterolisthesis measuring 8 mm is unchanged. No evidence of intraspinal hematoma. Limited detail at this level due to artifact from fusion hardware. IMPRESSION: Interval posterior decompression, diskectomy and fusion at L5-S1. Interbody spacer. Pedicle screws and posterior rods. Fixed anterolisthesis of 8 mm. Limited detail at the operative level because of artifact associated with fusion hardware, but there does not appear to be any unexpected hematoma. Fluid is present in the subcutaneous fat along the surgical approach, often seen following recent surgery. Simple appearing subdural fluid collection posteriorly extending from L2-L5. Maximal thickness of this is 5.5 mm. This does displace the nerve roots slightly within the thecal sac but there does not appear to be any frank nerve compression. Electronically Signed   By: Nelson Chimes M.D.   On: 09/26/2021 18:45     Procedures Procedures    Medications Ordered in ED Medications  0.9 %  sodium chloride infusion ( Intravenous New Bag/Given 09/26/21 1815)  ondansetron (ZOFRAN) injection 4 mg (4 mg Intravenous Given 09/26/21 1815)  HYDROmorphone (DILAUDID) injection 1 mg (1 mg Intravenous Given 09/26/21 1817)  diphenhydrAMINE (BENADRYL) injection 25 mg (25 mg Intravenous Given 09/26/21 1815)  gadobutrol (GADAVIST) 1 MMOL/ML injection 7.5 mL (7.5 mLs Intravenous Contrast Given 09/26/21 1822)    ED Course/ Medical Decision Making/ A&P                           Medical Decision Making Amount and/or Complexity of Data Reviewed Labs: ordered. Radiology: ordered.  Risk Prescription drug management.   Patient's back wound has no signs of infection.  Patient's dorsalis pedis pulses are 2+.  Good strength to the toes good sensation top and bottom of the foot.  No neurodeficit.  Labs CBC no leukocytosis hemoglobin 11 pregnancy test negative basic metabolic panel normal.  Urinalysis negative for urinary tract infection.  MRI of the lumbar spine without any significantly acute findings.  No evidence of any significant nerve impingement.  No evidence of any hematoma or evidence of any significant fluid collection.  Things seems to be postoperative type changes.  We will have patient follow-up with Dr. Louanne Skye on Monday.  I will add hydromorphone to her pain management.   Final Clinical Impression(s) / ED Diagnoses Final diagnoses:  Post-op pain  Acute bilateral low back pain with bilateral sciatica    Rx / DC Orders ED Discharge Orders     None         Fredia Sorrow, MD 09/26/21 [id number]    Fredia Sorrow, MD 09/26/21 [phone]

## 2021-09-26 NOTE — ED Notes (Signed)
Patient returned from MRI.

## 2021-09-28 ENCOUNTER — Encounter: Payer: Self-pay | Admitting: Specialist

## 2021-09-28 ENCOUNTER — Other Ambulatory Visit: Payer: Self-pay | Admitting: Specialist

## 2021-09-28 MED ORDER — METHYLPREDNISOLONE 4 MG PO TBPK: ORAL_TABLET | ORAL | 0 refills | Status: DC

## 2021-09-28 MED ORDER — OXYCODONE-ACETAMINOPHEN 5-325 MG PO TABS: 1.0000 | ORAL_TABLET | ORAL | 0 refills | Status: AC | PRN

## 2021-09-30 ENCOUNTER — Ambulatory Visit: Payer: BC Managed Care – PPO | Admitting: Specialist

## 2021-09-30 NOTE — Discharge Summary (Signed)
Patient ID: Autumn Rose MRN: [id number] DOB/AGE: May 21, 1966 56 y.o.  Admit date: 09/22/2021 Discharge date: 09/23/2021  Admission Diagnoses:  Principal Problem:   Spondylolisthesis, lumbosacral region Active Problems:   Fusion of spine of lumbar region   Discharge Diagnoses:  Principal Problem:   Spondylolisthesis, lumbosacral region Active Problems:   Fusion of spine of lumbar region  status post Procedure(s): RIGHT L5-S1 TRANSFORAMINAL LUMBAR INTERBODY FUSION WITH SCREWS, RODS AND CAGE, LOCAL BONE GRAFT, ALLOGRAFT BONE GRAFT AND VIVIGEN  Past Medical History:  Diagnosis Date   Anxiety    Complication of anesthesia    Bronchospasm after having tubal ligation in 2011   PONV (postoperative nausea and vomiting)     Surgeries: Procedure(s): RIGHT L5-S1 TRANSFORAMINAL LUMBAR INTERBODY FUSION WITH SCREWS, RODS AND CAGE, LOCAL BONE GRAFT, ALLOGRAFT BONE GRAFT AND VIVIGEN on 09/22/2021   Consultants:   Discharged Condition: Improved  Hospital Course: Autumn Rose is an 56 y.o. female who was admitted 09/22/2021 for operative treatment of Spondylolisthesis, lumbosacral region. Patient failed conservative treatments (please see the history and physical for the specifics) and had severe unremitting pain that affects sleep, daily activities and work/hobbies. After pre-op clearance, the patient was taken to the operating room on 09/22/2021 and underwent  Procedure(s): RIGHT L5-S1 TRANSFORAMINAL LUMBAR INTERBODY FUSION WITH SCREWS, RODS AND CAGE, LOCAL BONE GRAFT, ALLOGRAFT BONE GRAFT AND VIVIGEN.    Patient was given perioperative antibiotics:  Anti-infectives (From admission, onward)    Start     Dose/Rate Route Frequency Ordered Stop   09/22/21 1530  ceFAZolin (ANCEF) IVPB 2g/100 mL premix        2 g 200 mL/hr over 30 Minutes Intravenous Every 8 hours 09/22/21 1431 09/22/21 2330   09/22/21 0600  ceFAZolin (ANCEF) IVPB 2g/100 mL premix        2 g 200 mL/hr over 30 Minutes  Intravenous On call to O.R. 09/22/21 0547 09/22/21 1219        Patient was given sequential compression devices and early ambulation to prevent DVT.   Patient benefited maximally from hospital stay and there were no complications. At the time of discharge, the patient was urinating/moving their bowels without difficulty, tolerating a regular diet, pain is controlled with oral pain medications and they have been cleared by PT/OT.   Recent vital signs: No data found.   Recent laboratory studies: No results for input(s): WBC, HGB, HCT, PLT, NA, K, CL, CO2, BUN, CREATININE, GLUCOSE, INR, CALCIUM in the last 72 hours.  Invalid input(s): PT, 2   Discharge Medications:   Allergies as of 09/23/2021       Reactions   Oxycodone Itching   Tape Rash        Medication List     STOP taking these medications    cyclobenzaprine 10 MG tablet Commonly known as: FLEXERIL       TAKE these medications    CALCIUM 600 + D PO Take 1 tablet by mouth daily.   docusate sodium 100 MG capsule Commonly known as: COLACE Take 1 capsule (100 mg total) by mouth 2 (two) times daily.   ferrous gluconate 324 MG tablet Commonly known as: FERGON Take 1 tablet (324 mg total) by mouth 2 (two) times daily with a meal.   fish oil-omega-3 fatty acids 1000 MG capsule Take 1 g by mouth daily.   gabapentin 300 MG capsule Commonly known as: NEURONTIN Take 1 capsule (300 mg total) by mouth 3 (three) times daily.   HYDROcodone-acetaminophen 7.5-325 MG  tablet Commonly known as: NORCO Take 1-2 tablets by mouth every 6 (six) hours as needed for severe pain ((score 7 to 10)).   methocarbamol 500 MG tablet Commonly known as: ROBAXIN Take 1 tablet (500 mg total) by mouth every 6 (six) hours as needed for muscle spasms.   multivitamin with minerals tablet Take 1 tablet by mouth daily.   pantoprazole 40 MG tablet Commonly known as: PROTONIX Take 40 mg by mouth 2 (two) times daily with a meal.    phentermine 37.5 MG tablet Commonly known as: ADIPEX-P Take 37.5 mg by mouth daily.   sertraline 100 MG tablet Commonly known as: ZOLOFT Take 50 mg by mouth daily.        Diagnostic Studies: DG Lumbar Spine Complete  Result Date: 09/22/2021 CLINICAL DATA:  Right L5-S1 transforaminal lumbar interbody fusion with screws. EXAM: LUMBAR SPINE - COMPLETE 4+ VIEW COMPARISON:  Lumbar spine MRI 08/20/2021. FINDINGS: Same numbering scheme used for this study as on the previous MRI. 4 intraoperative spot fluoro films are submitted. Interbody device noted at the L5-S1 disc space. Bilateral pedicle screws are seen at L4 and L5 with bridging bilateral fusion rods. No evidence for immediate hardware complication. IMPRESSION: Intraoperative assessment during L4-5 fusion. No evidence for immediate hardware complications. Electronically Signed   By: Kennith Center M.D.   On: 09/22/2021 12:17   MR Lumbar Spine W Wo Contrast (assess for abscess, cord compression)  Result Date: 09/26/2021 CLINICAL DATA:  Low back pain. Cauda equina syndrome suspected. Fusion surgery on 09/25/2021. Worsening back pain and leg weakness. EXAM: MRI LUMBAR SPINE WITHOUT AND WITH CONTRAST TECHNIQUE: Multiplanar and multiecho pulse sequences of the lumbar spine were obtained without and with intravenous contrast. CONTRAST:  7.55mL GADAVIST GADOBUTROL 1 MMOL/ML IV SOLN COMPARISON:  Preoperative MRI 08/20/2021. Operative imaging 09/22/2021. FINDINGS: Segmentation: 5 lumbar type vertebral bodies as numbered previously. Alignment:  Fixed anterolisthesis at L5-S1 of 8 mm. Vertebrae:  No unexpected osseous finding. Conus medullaris and cauda equina: Conus extends to the L1-2 level. Conus and cauda equina appear normal. Paraspinal and other soft tissues: Nonenhancing fluid along the operative approach within the subcutaneous tissues, not unexpected in the setting of recent surgery. Disc levels: No disc level finding of significance at L3-4 or above.  The patient does have what is probably a subdural fluid collection with simple T2 bright fluid extending from L2-L5. Maximal thickness is 5.5 mm. This causes some displacement of the nerve structures within the thecal sac but does not result invisible neural compression. At L4-5, there is mild bulging of the disc as seen previously. At L5-S1, the patient has had posterior decompression, diskectomy and fusion. Interbody spacer in place. Pedicle screws and posterior rods. Fixed anterolisthesis measuring 8 mm is unchanged. No evidence of intraspinal hematoma. Limited detail at this level due to artifact from fusion hardware. IMPRESSION: Interval posterior decompression, diskectomy and fusion at L5-S1. Interbody spacer. Pedicle screws and posterior rods. Fixed anterolisthesis of 8 mm. Limited detail at the operative level because of artifact associated with fusion hardware, but there does not appear to be any unexpected hematoma. Fluid is present in the subcutaneous fat along the surgical approach, often seen following recent surgery. Simple appearing subdural fluid collection posteriorly extending from L2-L5. Maximal thickness of this is 5.5 mm. This does displace the nerve roots slightly within the thecal sac but there does not appear to be any frank nerve compression. Electronically Signed   By: Paulina Fusi M.D.   On: 09/26/2021 18:45  DG C-Arm 1-60 Min-No Report  Result Date: 09/22/2021 Fluoroscopy was utilized by the requesting physician.  No radiographic interpretation.   DG C-Arm 1-60 Min-No Report  Result Date: 09/22/2021 Fluoroscopy was utilized by the requesting physician.  No radiographic interpretation.   DG C-Arm 1-60 Min-No Report  Result Date: 09/22/2021 Fluoroscopy was utilized by the requesting physician.  No radiographic interpretation.   DG C-Arm 1-60 Min-No Report  Result Date: 09/22/2021 Fluoroscopy was utilized by the requesting physician.  No radiographic interpretation.     Discharge Instructions     Call MD / Call 911   Complete by: As directed    If you experience chest pain or shortness of breath, CALL 911 and be transported to the hospital emergency room.  If you develope a fever above 101 F, pus (white drainage) or increased drainage or redness at the wound, or calf pain, call your surgeon's office.   Constipation Prevention   Complete by: As directed    Drink plenty of fluids.  Prune juice may be helpful.  You may use a stool softener, such as Colace (over the counter) 100 mg twice a day.  Use MiraLax (over the counter) for constipation as needed.   Diet - low sodium heart healthy   Complete by: As directed    Discharge instructions   Complete by: As directed    Call if there is increasing drainage, fever greater than 101.5, severe head aches, and worsening nausea or light sensitivity. If shortness of breath, bloody cough or chest tightness or pain go to an emergency room. No lifting greater than 10 lbs. Avoid bending, stooping and twisting. Use brace when sitting and out of bed even to go to bathroom. Walk in house for first 2 weeks then may start to get out slowly increasing distances up to one mile by 4-6 weeks post op. After 5 days may shower and change dressing following bathing with shower.When bathing remove the brace shower and replace brace before getting out of the shower. If drainage, keep dry dressing and do not bathe the incision, use an moisture impervious dressing. Please call and return for scheduled follow up appointment 2 weeks from the time of surgery.   Increase activity slowly as tolerated   Complete by: As directed    Post-operative opioid taper instructions:   Complete by: As directed    POST-OPERATIVE OPIOID TAPER INSTRUCTIONS: It is important to wean off of your opioid medication as soon as possible. If you do not need pain medication after your surgery it is ok to stop day one. Opioids include: Codeine,  Hydrocodone(Norco, Vicodin), Oxycodone(Percocet, oxycontin) and hydromorphone amongst others.  Long term and even short term use of opiods can cause: Increased pain response Dependence Constipation Depression Respiratory depression And more.  Withdrawal symptoms can include Flu like symptoms Nausea, vomiting And more Techniques to manage these symptoms Hydrate well Eat regular healthy meals Stay active Use relaxation techniques(deep breathing, meditating, yoga) Do Not substitute Alcohol to help with tapering If you have been on opioids for less than two weeks and do not have pain than it is ok to stop all together.  Plan to wean off of opioids This plan should start within one week post op of your joint replacement. Maintain the same interval or time between taking each dose and first decrease the dose.  Cut the total daily intake of opioids by one tablet each day Next start to increase the time between doses. The last dose that should be  eliminated is the evening dose.           Follow-up Information     Kerrin Champagne, MD Follow up in 2 week(s).   Specialty: Orthopedic Surgery Why: For wound re-check Contact information: [address] [place] [address] [phone]                 Discharge Plan:  discharge to home  Disposition:     Signed: Zonia Kief  09/30/2021, 2:46 PM

## 2021-10-07 ENCOUNTER — Telehealth: Payer: Self-pay | Admitting: Surgery

## 2021-10-07 ENCOUNTER — Encounter: Payer: Self-pay | Admitting: Surgery

## 2021-10-07 ENCOUNTER — Ambulatory Visit: Payer: Self-pay

## 2021-10-07 ENCOUNTER — Ambulatory Visit (INDEPENDENT_AMBULATORY_CARE_PROVIDER_SITE_OTHER): Payer: BC Managed Care – PPO | Admitting: Surgery

## 2021-10-07 ENCOUNTER — Telehealth: Payer: Self-pay | Admitting: Specialist

## 2021-10-07 DIAGNOSIS — M7061 Trochanteric bursitis, right hip: Secondary | ICD-10-CM | POA: Diagnosis not present

## 2021-10-07 DIAGNOSIS — M5416 Radiculopathy, lumbar region: Secondary | ICD-10-CM

## 2021-10-07 DIAGNOSIS — Z981 Arthrodesis status: Secondary | ICD-10-CM

## 2021-10-07 MED ORDER — METHYLPREDNISOLONE ACETATE 40 MG/ML IJ SUSP
80.0000 mg | INTRAMUSCULAR | Status: AC | PRN
  Administered 2021-10-07: 80 mg via INTRA_ARTICULAR

## 2021-10-07 MED ORDER — BUPIVACAINE HCL 0.25 % IJ SOLN
6.0000 mL | INTRAMUSCULAR | Status: AC | PRN
  Administered 2021-10-07: 6 mL via INTRA_ARTICULAR

## 2021-10-07 MED ORDER — LIDOCAINE HCL 1 % IJ SOLN
3.0000 mL | INTRAMUSCULAR | Status: AC | PRN
  Administered 2021-10-07: 3 mL

## 2021-10-07 NOTE — Progress Notes (Signed)
? ?Office Visit Note ?  ?Patient: Autumn Rose           ?Date of Birth: [birth year]           ?MRN: [id number] ?Visit Date: 10/07/2021 ?             ?Requested by: [place], [place], PA-C ?[address] ?[address] ?[place],  [place] [address] ?PCP: Burnis Medin, PA-C ? ? ?Assessment & Plan: ?Visit Diagnoses:  ?1. S/P lumbar spinal fusion   ?2. Radiculopathy, lumbar region   ?3. Greater trochanteric bursitis of right hip   ? ? ?Plan: Patient advised that Dr. Otelia Sergeant did review lumbar MRI.  He did not see any signs of nerve compression.  In hopes of giving patient some improvement of her right lateral hip pain I performed greater trochanter bursa Marcaine/Depo-Medrol injection.  Tolerated procedure without complication.  After standing for a few minutes patient reported very good relief of her lateral right hip pain with anesthetic in place.  Lumbar staples removed and Steri-Strips applied.  Patient will follow-up with Dr. Otelia Sergeant in 2 weeks for recheck.  Advised her that the postop leg discomfort that she is feeling at this time can be expected and should gradually continue to improve.  If symptoms worsen we may consider getting a CT lumbar spine.  I do not think this is indicated at this point.  I also stressed the patient the importance of being compliant with postop instructions which include no bending, twisting, lifting, pushing, pulling or any other aggressive activity.  After speaking with patient and her husband further she has been overly aggressive the last couple of weeks and pushing her activity which I feel has been contributing to her postop pain.  She voices understanding and states that she will be aware of what she is doing.  Gradually increase walking distances over the next several weeks. ? ?Follow-Up Instructions: Return in about 2 weeks (around 10/21/2021) for WITH DR NITKA FOR POSTOP CHECK (PER [name]).  ? ?Orders:  ?Orders Placed This Encounter  ?Procedures  ? Large Joint Inj  ? XR  Lumbar Spine 2-3 Views  ? ?No orders of the defined types were placed in this encounter. ? ? ? ? Procedures: ?Large Joint Inj: R greater trochanter on 10/07/2021 9:50 AM ?Details: 22 G 3.5 in needle, lateral approach ?Medications: 3 mL lidocaine 1 %; 80 mg methylPREDNISolone acetate 40 MG/ML; 6 mL bupivacaine 0.25 % ?Outcome: tolerated well, no immediate complications ?Consent was given by the patient. Patient was prepped and draped in the usual sterile fashion.  ? ? ? ? ?Clinical Data: ?No additional findings. ? ? ?Subjective: ?Chief Complaint  ?Patient presents with  ? Right Hip - Follow-up  ? ? ?HPI ?56 year old white female who is 2 weeks status post L5-S1 fusion returns.  Patient presented to the emergency department September 18, 2021 with increased leg pain.  She did have a lumbar MRI and that report showed: ? ?CLINICAL DATA:  Low back pain. Cauda equina syndrome suspected. ?Fusion surgery on 09/25/2021. Worsening back pain and leg weakness. ?  ?EXAM: ?MRI LUMBAR SPINE WITHOUT AND WITH CONTRAST ?  ?TECHNIQUE: ?Multiplanar and multiecho pulse sequences of the lumbar spine were ?obtained without and with intravenous contrast. ?  ?CONTRAST:  7.23mL GADAVIST GADOBUTROL 1 MMOL/ML IV SOLN ?  ?COMPARISON:  Preoperative MRI 08/20/2021. Operative imaging ?09/22/2021. ?  ?FINDINGS: ?Segmentation: 5 lumbar type vertebral bodies as numbered previously. ?  ?Alignment:  Fixed anterolisthesis at L5-S1 of  8 mm. ?  ?Vertebrae:  No unexpected osseous finding. ?  ?Conus medullaris and cauda equina: Conus extends to the L1-2 level. ?Conus and cauda equina appear normal. ?  ?Paraspinal and other soft tissues: Nonenhancing fluid along the ?operative approach within the subcutaneous tissues, not unexpected ?in the setting of recent surgery. ?  ?Disc levels: ?  ?No disc level finding of significance at L3-4 or above. The patient ?does have what is probably a subdural fluid collection with simple ?T2 bright fluid extending from L2-L5.  Maximal thickness is 5.5 mm. ?This causes some displacement of the nerve structures within the ?thecal sac but does not result invisible neural compression. ?  ?At L4-5, there is mild bulging of the disc as seen previously. ?  ?At L5-S1, the patient has had posterior decompression, diskectomy ?and fusion. Interbody spacer in place. Pedicle screws and posterior ?rods. Fixed anterolisthesis measuring 8 mm is unchanged. No evidence ?of intraspinal hematoma. Limited detail at this level due to ?artifact from fusion hardware. ?  ?IMPRESSION: ?Interval posterior decompression, diskectomy and fusion at L5-S1. ?Interbody spacer. Pedicle screws and posterior rods. Fixed ?anterolisthesis of 8 mm. Limited detail at the operative level ?because of artifact associated with fusion hardware, but there does ?not appear to be any unexpected hematoma. Fluid is present in the ?subcutaneous fat along the surgical approach, often seen following ?recent surgery. ?  ?Simple appearing subdural fluid collection posteriorly extending ?from L2-L5. Maximal thickness of this is 5.5 mm. This does displace ?the nerve roots slightly within the thecal sac but there does not ?appear to be any frank nerve compression. ?  ?  ?Electronically Signed ?  By: Paulina Fusi M.D. ?  On: 09/26/2021 18:45 ?  ?Patient states that she had increased pain in the right leg which was worse than preop.  This has improved over the last several days but states that is still painful.  She also has a pain in the right lateral hip that extends down the course of her IT band.  Pain when she is sleeping at night.  Patient denies any issues with bowel or bladder incontinence. ?Objective: ?Vital Signs: There were no vitals taken for this visit. ? ?Physical Exam ?Pleasant female alert and oriented in no acute distress.  She is ambulating well.  Not using a walker or cane.  Lumbar wound looks good.  Staples removed and Steri-Strips applied.  No drainage or signs of infection.   Negative straight leg raise.  She continues to have trace right gastroc weakness that is unchanged from my initial exam preop.  All other strength maneuvers intact. ?Ortho Exam ? ?Specialty Comments:  ?No specialty comments available. ? ?Imaging: ?No results found. ? ? ?PMFS History: ?Patient Active Problem List  ? Diagnosis Date Noted  ? Spondylolisthesis, lumbosacral region 09/22/2021  ?  Class: Chronic  ? Fusion of spine of lumbar region 09/22/2021  ? Chest pain with moderate risk of acute coronary syndrome 09/23/2016  ? Abnormal EKG 09/23/2016  ? Dyspnea 01/11/2012  ? Appendicitis, acute 05/18/2011  ? ?Past Medical History:  ?Diagnosis Date  ? Anxiety   ? Complication of anesthesia   ? Bronchospasm after having tubal ligation in 2011  ? PONV (postoperative nausea and vomiting)   ?  ?Family History  ?Problem Relation Age of Onset  ? Hypertension Mother   ? Hyperlipidemia Father   ? Cancer Father   ? Non-Hodgkin's lymphoma Father   ? Aneurysm Maternal Grandfather   ? Lung cancer Paternal Grandmother   ?  Stroke Paternal Grandfather   ? Epilepsy Sister   ? Epilepsy Sister   ?  ?Past Surgical History:  ?Procedure Laterality Date  ? ABLATION    ? APPENDECTOMY  2012  ? BACK SURGERY    ? BILATERAL CARPAL TUNNEL RELEASE Bilateral 2021  ? November 30th & December 14th  ? BREAST IMPLANT REMOVAL    ? BREAST SURGERY    ? FOOT SURGERY    ? LIPOMA EXCISION Left 2005  ? MENISCUS REPAIR Right 2012  ? TUBAL LIGATION    ? WISDOM TOOTH EXTRACTION  1991  ? ?Social History  ? ?Occupational History  ? Not on file  ?Tobacco Use  ? Smoking status: Former  ?  Types: Cigarettes  ?  Quit date: 05/17/1986  ?  Years since quitting: 35.4  ? Smokeless tobacco: Never  ?Vaping Use  ? Vaping Use: Never used  ?Substance and Sexual Activity  ? Alcohol use: Yes  ?  Comment: occ  ? Drug use: No  ? Sexual activity: Not on file  ? ? ? ? ? ? ?

## 2021-10-07 NOTE — Telephone Encounter (Signed)
[employer] forms received. To [employer]. °

## 2021-10-07 NOTE — Telephone Encounter (Signed)
Received medical records release form,$25.00 check and FMLA paperwork from  patient/Forwarding to [employer] today 

## 2021-10-21 ENCOUNTER — Telehealth: Payer: Self-pay | Admitting: Specialist

## 2021-10-21 NOTE — Telephone Encounter (Signed)
[employer] forms received. To [employer]. °

## 2021-10-22 ENCOUNTER — Other Ambulatory Visit: Payer: Self-pay

## 2021-10-22 ENCOUNTER — Encounter: Payer: Self-pay | Admitting: Specialist

## 2021-10-22 ENCOUNTER — Encounter: Payer: BC Managed Care – PPO | Admitting: Surgery

## 2021-10-22 ENCOUNTER — Ambulatory Visit (INDEPENDENT_AMBULATORY_CARE_PROVIDER_SITE_OTHER): Payer: BC Managed Care – PPO | Admitting: Specialist

## 2021-10-22 VITALS — BP 126/89 | HR 94 | Ht 65.0 in | Wt 175.0 lb

## 2021-10-22 DIAGNOSIS — R29898 Other symptoms and signs involving the musculoskeletal system: Secondary | ICD-10-CM

## 2021-10-22 DIAGNOSIS — Z981 Arthrodesis status: Secondary | ICD-10-CM

## 2021-10-22 MED ORDER — GABAPENTIN 300 MG PO CAPS: 300.0000 mg | ORAL_CAPSULE | Freq: Three times a day (TID) | ORAL | 1 refills | Status: DC

## 2021-10-22 NOTE — Patient Instructions (Signed)
Avoid frequent bending and stooping  ?No lifting greater than 10 lbs. ?May use ice or moist heat for pain. ?Weight loss is of benefit. ?Ice to the back or moist heat for pain  ?Exercise is important to improve your indurance and does allow people to function better inspite of back pain. ? Recommend that she be out of work for at least 8 weeks from the time of surgery due to right leg weakness and muscle pain related to her surgery.  ?

## 2021-10-22 NOTE — Progress Notes (Signed)
? ?  Post-Op Visit Note ?  ?Patient: Autumn Rose           ?Date of Birth: 02-Jan-1966           ?MRN: [id number] ?Visit Date: 10/22/2021 ?PCP: Burnis Medin, PA-C ? ? ?Assessment & Plan:4 weeks post op TLIF L5-S1 ? ?Chief Complaint:  ?Chief Complaint  ?Patient presents with  ? Lower Back - Routine Post Op  ?  S/p TLIF 09/22/2021  ?Incison is healed ?Legs with motor intact ?She has some residual numbness right anterior lateral calf. ? ?Visit Diagnoses: No diagnosis found. ? ?Plan: Avoid frequent bending and stooping  ?No lifting greater than 5-10 lbs. ?May use ice or moist heat for pain. ?Weight loss is of benefit. ?Ice to the back as need 1/2 hours on and 1/2 hour off ?Exercise is important to improve your indurance and does allow people to function better inspite of back pain. ?  ? ?Follow-Up Instructions: Return in about 6 weeks (around 12/03/2021).  ? ?Orders:  ?No orders of the defined types were placed in this encounter. ? ?No orders of the defined types were placed in this encounter. ? ? ?Imaging: ?No results found. ? ?PMFS History: ?Patient Active Problem List  ? Diagnosis Date Noted  ? Spondylolisthesis, lumbosacral region 09/22/2021  ?  Priority: High  ?  Class: Chronic  ? Fusion of spine of lumbar region 09/22/2021  ? Chest pain with moderate risk of acute coronary syndrome 09/23/2016  ? Abnormal EKG 09/23/2016  ? Dyspnea 01/11/2012  ? Appendicitis, acute 05/18/2011  ? ?Past Medical History:  ?Diagnosis Date  ? Anxiety   ? Complication of anesthesia   ? Bronchospasm after having tubal ligation in 2011  ? PONV (postoperative nausea and vomiting)   ?  ?Family History  ?Problem Relation Age of Onset  ? Hypertension Mother   ? Hyperlipidemia Father   ? Cancer Father   ? Non-Hodgkin's lymphoma Father   ? Aneurysm Maternal Grandfather   ? Lung cancer Paternal Grandmother   ? Stroke Paternal Grandfather   ? Epilepsy Sister   ? Epilepsy Sister   ?  ?Past Surgical History:  ?Procedure Laterality Date  ?  ABLATION    ? APPENDECTOMY  2012  ? BACK SURGERY    ? BILATERAL CARPAL TUNNEL RELEASE Bilateral 2021  ? November 30th & December 14th  ? BREAST IMPLANT REMOVAL    ? BREAST SURGERY    ? FOOT SURGERY    ? LIPOMA EXCISION Left 2005  ? MENISCUS REPAIR Right 2012  ? TUBAL LIGATION    ? WISDOM TOOTH EXTRACTION  1991  ? ?Social History  ? ?Occupational History  ? Not on file  ?Tobacco Use  ? Smoking status: Former  ?  Types: Cigarettes  ?  Quit date: 05/17/1986  ?  Years since quitting: 35.4  ? Smokeless tobacco: Never  ?Vaping Use  ? Vaping Use: Never used  ?Substance and Sexual Activity  ? Alcohol use: Yes  ?  Comment: occ  ? Drug use: No  ? Sexual activity: Not on file  ? ? ? ?

## 2021-10-28 ENCOUNTER — Encounter: Payer: Self-pay | Admitting: Specialist

## 2021-10-30 ENCOUNTER — Other Ambulatory Visit: Payer: Self-pay | Admitting: Specialist

## 2021-11-04 ENCOUNTER — Other Ambulatory Visit: Payer: Self-pay

## 2021-11-04 ENCOUNTER — Ambulatory Visit: Payer: BC Managed Care – PPO

## 2021-11-04 ENCOUNTER — Encounter: Payer: Self-pay | Admitting: Surgery

## 2021-11-04 ENCOUNTER — Ambulatory Visit (INDEPENDENT_AMBULATORY_CARE_PROVIDER_SITE_OTHER): Payer: BC Managed Care – PPO | Admitting: Surgery

## 2021-11-04 DIAGNOSIS — Z981 Arthrodesis status: Secondary | ICD-10-CM

## 2021-11-04 NOTE — Progress Notes (Signed)
? ?Office Visit Note ?  ?Patient: Autumn Rose           ?Date of Birth: February 28, 1966           ?MRN: [id number] ?Visit Date: 11/04/2021 ?             ?Requested by: [place], [place], [place] ?[address] ?[address] ?[place],  [place] [address] ?PCP: Elisabeth Cara, PA-C ? ? ?Assessment & Plan: ?Visit Diagnoses:  ?1. S/P lumbar spinal fusion   ? ? ?Plan: I advised patient again that I think that she is doing too much.  She definitely should not be driving as she has been up to this point.  She is to continue wearing her brace and absolutely no driving until at least 12 weeks postop after she has been cleared by Dr. Louanne Skye to do so.  Still must avoid twisting, bending, lifting, pushing, pulling.  Gradually increase walking distances over the next several weeks.  Nothing too aggressive.  I advised patient that the more aggressive she is the more she will continue to have ongoing low back pain and right lower extremity symptoms.  She again voices understanding.  Follow-up with Dr. Louanne Skye in 6 weeks and he will repeat x-rays at that time and decide whether or not patient can come out of her brace. ? ?Follow-Up Instructions: Return in about 6 weeks (around 12/16/2021) for WITH DR NITKA FOR 12 WEEKS POSTOP CHECK.  XRAYS AND QUESTION BRACE REMOVAL.  ? ?Orders:  ?Orders Placed This Encounter  ?Procedures  ? XR Lumbar Spine 2-3 Views  ? ?No orders of the defined types were placed in this encounter. ? ? ? ? Procedures: ?No procedures performed ? ? ?Clinical Data: ?No additional findings. ? ? ?Subjective: ?Chief Complaint  ?Patient presents with  ? Lower Back - Routine Post Op  ? ? ?HPI ?56 year old white female who is 6 weeks status post L5-S1 fusion returns.  She just saw Dr. Louanne Skye couple weeks ago and was not scheduled to return for another few weeks but she states that she has had increased pain in her low back that radiates down to her right foot.  States that this is similar to her preop symptoms.  After further  questioning patient states that she has been driving already.  When I had seen patient October 07, 2021 I had long discussion with her advised that she was doing way too much that also was aggravating her symptoms at that time. ? ? ?Objective: ?Vital Signs: There were no vitals taken for this visit. ? ?Physical Exam ?Pleasant female alert and oriented in no acute distress.  Gait is somewhat antalgic.  Surgical incision well-healed.  Negative logroll bilateral hips.  She does have trace right anterior tib and gastroc weakness. ?Ortho Exam ? ?Specialty Comments:  ?No specialty comments available. ? ?Imaging: ?No results found. ? ? ?PMFS History: ?Patient Active Problem List  ? Diagnosis Date Noted  ? Spondylolisthesis, lumbosacral region 09/22/2021  ?  Class: Chronic  ? Fusion of spine of lumbar region 09/22/2021  ? Chest pain with moderate risk of acute coronary syndrome 09/23/2016  ? Abnormal EKG 09/23/2016  ? Dyspnea 01/11/2012  ? Appendicitis, acute 05/18/2011  ? ?Past Medical History:  ?Diagnosis Date  ? Anxiety   ? Complication of anesthesia   ? Bronchospasm after having tubal ligation in 2011  ? PONV (postoperative nausea and vomiting)   ?  ?Family History  ?Problem Relation Age of Onset  ? Hypertension  Mother   ? Hyperlipidemia Father   ? Cancer Father   ? Non-Hodgkin's lymphoma Father   ? Aneurysm Maternal Grandfather   ? Lung cancer Paternal Grandmother   ? Stroke Paternal Grandfather   ? Epilepsy Sister   ? Epilepsy Sister   ?  ?Past Surgical History:  ?Procedure Laterality Date  ? ABLATION    ? APPENDECTOMY  2012  ? BACK SURGERY    ? BILATERAL CARPAL TUNNEL RELEASE Bilateral 2021  ? November 30th & December 14th  ? BREAST IMPLANT REMOVAL    ? BREAST SURGERY    ? FOOT SURGERY    ? LIPOMA EXCISION Left 2005  ? MENISCUS REPAIR Right 2012  ? TUBAL LIGATION    ? [place] EXTRACTION  1991  ? ?Social History  ? ?Occupational History  ? Not on file  ?Tobacco Use  ? Smoking status: Former  ?  Types: Cigarettes  ?   Quit date: 05/17/1986  ?  Years since quitting: 35.4  ? Smokeless tobacco: Never  ?Vaping Use  ? Vaping Use: Never used  ?Substance and Sexual Activity  ? Alcohol use: Yes  ?  Comment: occ  ? Drug use: No  ? Sexual activity: Not on file  ? ? ? ? ? ? ?

## 2021-11-09 ENCOUNTER — Encounter: Payer: Self-pay | Admitting: Specialist

## 2021-11-11 ENCOUNTER — Encounter: Payer: Self-pay | Admitting: Radiology

## 2021-12-03 ENCOUNTER — Encounter: Payer: BC Managed Care – PPO | Admitting: Specialist

## 2021-12-16 ENCOUNTER — Ambulatory Visit (INDEPENDENT_AMBULATORY_CARE_PROVIDER_SITE_OTHER): Payer: BC Managed Care – PPO | Admitting: Specialist

## 2021-12-16 ENCOUNTER — Ambulatory Visit (INDEPENDENT_AMBULATORY_CARE_PROVIDER_SITE_OTHER): Payer: BC Managed Care – PPO

## 2021-12-16 ENCOUNTER — Encounter: Payer: Self-pay | Admitting: Specialist

## 2021-12-16 VITALS — BP 136/86 | HR 71 | Ht 65.0 in | Wt 175.0 lb

## 2021-12-16 DIAGNOSIS — M5441 Lumbago with sciatica, right side: Secondary | ICD-10-CM

## 2021-12-16 DIAGNOSIS — Z981 Arthrodesis status: Secondary | ICD-10-CM

## 2021-12-16 MED ORDER — IBUPROFEN 600 MG PO TABS: 600.0000 mg | ORAL_TABLET | Freq: Four times a day (QID) | ORAL | 2 refills | Status: DC | PRN

## 2021-12-16 NOTE — Patient Instructions (Signed)
Avoid frequent bending and stooping  No lifting greater than 10-15 lbs. May use ice or moist heat for pain. Weight loss is of benefit. Best medication for lumbar disc disease is arthritis medications like motrin, celebrex and naprosyn. Exercise is important to improve your indurance and does allow people to function better inspite of back pain.   

## 2021-12-16 NOTE — Progress Notes (Signed)
? ?Post-Op Visit Note ?  ?Patient: Autumn Rose           ?Date of Birth: 1966-05-20           ?MRN: [id number] ?Visit Date: 12/16/2021 ?PCP: Burnis Medin, PA-C ? ? ?Assessment & Plan: ? ?Chief Complaint:  ?Chief Complaint  ?Patient presents with  ? Lower Back - Routine Post Op  ?  States that she is having trouble with bending, sleeping, and sitting, complaining of burning in the bottom of the buttocks  ? ?Visit Diagnoses:  ?1. S/P lumbar spinal fusion   ? ?Pain into the lateral thighs and buttocks. ?No bowel or bladder difficutly. ?Some numbness into the little toe bilaterally. ?Pain seems to have gotten worse lately ?Radiographs. ? ?Plan: Avoid frequent bending and stooping  ?No lifting greater than 10 lbs. ?May use ice or moist heat for pain. ?Weight loss is of benefit. ?Best medication for lumbar disc disease is arthritis medications like motrin, celebrex and naprosyn. ?Exercise is important to improve your indurance and does allow people to function better inspite of back pain. ?CT scan of the lumbar spine. ? ?Follow-Up Instructions: Return in about 2 weeks (around 12/30/2021).  ? ?Orders:  ?Orders Placed This Encounter  ?Procedures  ? XR Lumbar Spine 2-3 Views  ? CT LUMBAR SPINE WO CONTRAST  ? ?Meds ordered this encounter  ?Medications  ? ibuprofen (ADVIL) 600 MG tablet  ?  Sig: Take 1 tablet (600 mg total) by mouth every 6 (six) hours as needed for mild pain.  ?  Dispense:  60 tablet  ?  Refill:  2  ? ? ?Imaging: ?XR Lumbar Spine 2-3 Views ? ?Result Date: 12/16/2021 ?Ap and lateral flexion and extension radiographs show the hardware, pedicle screws and rods in good position and alingment. With flexion and extension the cage bone interface is normal. Very little if any movement across the aanteriolumbar level L5-S1.    ? ?PMFS History: ?Patient Active Problem List  ? Diagnosis Date Noted  ? Spondylolisthesis, lumbosacral region 09/22/2021  ?  Priority: High  ?  Class: Chronic  ? Fusion of spine of  lumbar region 09/22/2021  ? Chest pain with moderate risk of acute coronary syndrome 09/23/2016  ? Abnormal EKG 09/23/2016  ? Dyspnea 01/11/2012  ? Appendicitis, acute 05/18/2011  ? ?Past Medical History:  ?Diagnosis Date  ? Anxiety   ? Complication of anesthesia   ? Bronchospasm after having tubal ligation in 2011  ? PONV (postoperative nausea and vomiting)   ?  ?Family History  ?Problem Relation Age of Onset  ? Hypertension Mother   ? Hyperlipidemia Father   ? Cancer Father   ? Non-Hodgkin's lymphoma Father   ? Aneurysm Maternal Grandfather   ? Lung cancer Paternal Grandmother   ? Stroke Paternal Grandfather   ? Epilepsy Sister   ? Epilepsy Sister   ?  ?Past Surgical History:  ?Procedure Laterality Date  ? ABLATION    ? APPENDECTOMY  2012  ? BACK SURGERY    ? BILATERAL CARPAL TUNNEL RELEASE Bilateral 2021  ? November 30th & December 14th  ? BREAST IMPLANT REMOVAL    ? BREAST SURGERY    ? FOOT SURGERY    ? LIPOMA EXCISION Left 2005  ? MENISCUS REPAIR Right 2012  ? TUBAL LIGATION    ? WISDOM TOOTH EXTRACTION  1991  ? ?Social History  ? ?Occupational History  ? Not on file  ?Tobacco Use  ? Smoking status: Former  ?  Types: Cigarettes  ?  Quit date: 05/17/1986  ?  Years since quitting: 35.6  ? Smokeless tobacco: Never  ?Vaping Use  ? Vaping Use: Never used  ?Substance and Sexual Activity  ? Alcohol use: Yes  ?  Comment: occ  ? Drug use: No  ? Sexual activity: Not on file  ? ? ? ?

## 2021-12-21 ENCOUNTER — Encounter: Payer: Self-pay | Admitting: Specialist

## 2021-12-25 ENCOUNTER — Ambulatory Visit
Admission: RE | Admit: 2021-12-25 | Discharge: 2021-12-25 | Disposition: A | Discharge status: Home / Self Care | Payer: BC Managed Care – PPO | Source: Ambulatory Visit | Attending: Specialist | Admitting: Specialist

## 2021-12-25 DIAGNOSIS — Z981 Arthrodesis status: Secondary | ICD-10-CM | POA: Diagnosis present

## 2021-12-30 ENCOUNTER — Ambulatory Visit (INDEPENDENT_AMBULATORY_CARE_PROVIDER_SITE_OTHER): Payer: BC Managed Care – PPO | Admitting: Specialist

## 2021-12-30 ENCOUNTER — Encounter: Payer: Self-pay | Admitting: Specialist

## 2021-12-30 VITALS — BP 152/97 | HR 81 | Ht 65.0 in | Wt 175.0 lb

## 2021-12-30 DIAGNOSIS — T84498A Other mechanical complication of other internal orthopedic devices, implants and grafts, initial encounter: Secondary | ICD-10-CM

## 2021-12-30 DIAGNOSIS — Z981 Arthrodesis status: Secondary | ICD-10-CM

## 2021-12-30 DIAGNOSIS — R29898 Other symptoms and signs involving the musculoskeletal system: Secondary | ICD-10-CM

## 2021-12-30 DIAGNOSIS — M7061 Trochanteric bursitis, right hip: Secondary | ICD-10-CM

## 2021-12-30 DIAGNOSIS — M5441 Lumbago with sciatica, right side: Secondary | ICD-10-CM | POA: Diagnosis not present

## 2021-12-30 MED ORDER — TRAMADOL-ACETAMINOPHEN 37.5-325 MG PO TABS: 1.0000 | ORAL_TABLET | Freq: Four times a day (QID) | ORAL | 0 refills | Status: DC | PRN

## 2021-12-30 NOTE — Progress Notes (Addendum)
Post-Op Visit Note   Patient: Autumn Rose           Date of Birth: [birth year]           MRN: [id number] Visit Date: 12/30/2021 PCP: Elisabeth Cara, PA-C   Assessment & Plan:3.5 months post op lumbar fusion L5-S1 for spondylolisthesis. Pain with bending and with extension bilateral Buttock and SI areas   Chief Complaint:  Chief Complaint  Patient presents with   Lower Back - Follow-up    CT Scan review  CLINICAL DATA:  Persistent bilateral buttock and thigh pain after L5-S1 fusion 3 months ago.   EXAM: CT LUMBAR SPINE WITHOUT CONTRAST   TECHNIQUE: Multidetector CT imaging of the lumbar spine was performed without intravenous contrast administration. Multiplanar CT image reconstructions were also generated.   RADIATION DOSE REDUCTION: This exam was performed according to the departmental dose-optimization program which includes automated exposure control, adjustment of the mA and/or kV according to patient size and/or use of iterative reconstruction technique.   COMPARISON:  MRI lumbar spine dated September 26, 2021.   FINDINGS: Segmentation: 5 lumbar type vertebrae.   Alignment: Unchanged 6 mm anterolisthesis at L5-S1.   Vertebrae: Status post L5-S1 TLIF. Mild lucency around the bilateral S1 screws. Mild subsidence of the interbody graft without significant bony fusion across the disc space. Chronic bilateral L5 pars defects again noted. No acute fracture or other focal pathologic process.   Paraspinal and other soft tissues: Negative.   Disc levels:   T12-L1 to L4-L5:  Negative.   L5-S1: Prior TLIF.  Residual mild right neuroforaminal stenosis.   IMPRESSION: 1. Status post L5-S1 TLIF with mild loosening of the bilateral S1 screws and mild subsidence of the interbody graft. No significant bony fusion.     Electronically Signed   By: Titus Dubin M.D.   On: 12/25/2021 14:45 Visit Diagnoses:  1. Loosening of hardware in spine ([place])   2.  S/P lumbar spinal fusion   3. Acute bilateral low back pain with right-sided sciatica   4. Weakness of right lower extremity   5. Greater trochanteric bursitis of right hip     Plan: Avoid frequent bending and stooping  No lifting greater than 10 lbs. May use ice or moist heat for pain. Weight loss is of benefit. Best medication for lumbar disc disease is arthritis medications like tylenol or motrin. Start  ultracet for more severe pain. Laboratory to assess for infection ongoing anemia or vitamin D deficiency.  Exercise is important to improve your indurance and does allow people to function better inspite of back pain.    Follow-Up Instructions: Return in about 3 weeks (around 01/20/2022).   Orders:  Orders Placed This Encounter  Procedures   VITAMIN D 25 Hydroxy (Vit-D Deficiency, Fractures)   Sed Rate (ESR)   C-reactive protein   CBC with Differential   Meds ordered this encounter  Medications   traMADol-acetaminophen (ULTRACET) 37.5-325 MG tablet    Sig: Take 1 tablet by mouth every 6 (six) hours as needed.    Dispense:  30 tablet    Refill:  0    Imaging: No results found.  PMFS History: Patient Active Problem List   Diagnosis Date Noted   Spondylolisthesis, lumbosacral region 09/22/2021    Priority: High    Class: Chronic   Fusion of spine of lumbar region 09/22/2021   Chest pain with moderate risk of acute coronary syndrome 09/23/2016   Abnormal EKG 09/23/2016   Dyspnea 01/11/2012  Appendicitis, acute 05/18/2011   Past Medical History:  Diagnosis Date   Anxiety    Complication of anesthesia    Bronchospasm after having tubal ligation in 2011   PONV (postoperative nausea and vomiting)     Family History  Problem Relation Age of Onset   Hypertension Mother    Hyperlipidemia Father    Cancer Father    Non-Hodgkin's lymphoma Father    Aneurysm Maternal Grandfather    Lung cancer Paternal Grandmother    Stroke Paternal Grandfather    Epilepsy Sister     Epilepsy Sister     Past Surgical History:  Procedure Laterality Date   ABLATION     APPENDECTOMY  2012   BACK SURGERY     BILATERAL CARPAL TUNNEL RELEASE Bilateral 2021   November 30th & December 14th   BREAST IMPLANT REMOVAL     BREAST SURGERY     FOOT SURGERY     LIPOMA EXCISION Left 2005   MENISCUS REPAIR Right 2012   TUBAL LIGATION     WISDOM TOOTH EXTRACTION  1991   Social History   Occupational History   Not on file  Tobacco Use   Smoking status: Former    Types: Cigarettes    Quit date: 05/17/1986    Years since quitting: 35.6   Smokeless tobacco: Never  Vaping Use   Vaping Use: Never used  Substance and Sexual Activity   Alcohol use: Yes    Comment: occ   Drug use: No   Sexual activity: Not on file

## 2021-12-31 LAB — CBC WITH DIFFERENTIAL/PLATELET
Absolute Monocytes: 442 cells/uL (ref 200–950)
Basophils Absolute: 51 cells/uL (ref 0–200)
Basophils Relative: 0.8 %
Eosinophils Absolute: 83 cells/uL (ref 15–500)
Eosinophils Relative: 1.3 %
HCT: 44.9 % (ref 35.0–45.0)
Hemoglobin: 14.6 g/dL (ref 11.7–15.5)
Lymphs Abs: 1683 cells/uL (ref 850–3900)
MCH: 27.3 pg (ref 27.0–33.0)
MCHC: 32.5 g/dL (ref 32.0–36.0)
MCV: 84.1 fL (ref 80.0–100.0)
MPV: 10.7 fL (ref 7.5–12.5)
Monocytes Relative: 6.9 %
Neutro Abs: 4141 cells/uL (ref 1500–7800)
Neutrophils Relative %: 64.7 %
Platelets: 357 10*3/uL (ref 140–400)
RBC: 5.34 10*6/uL — ABNORMAL HIGH (ref 3.80–5.10)
RDW: 13.4 % (ref 11.0–15.0)
Total Lymphocyte: 26.3 %
WBC: 6.4 10*3/uL (ref 3.8–10.8)

## 2021-12-31 LAB — SEDIMENTATION RATE: Sed Rate: 9 mm/h (ref 0–30)

## 2021-12-31 LAB — C-REACTIVE PROTEIN: CRP: 2.1 mg/L (ref <8.0)

## 2021-12-31 LAB — VITAMIN D 25 HYDROXY (VIT D DEFICIENCY, FRACTURES): Vit D, 25-Hydroxy: 40 ng/mL (ref 30–100)

## 2022-01-13 NOTE — Patient Instructions (Signed)
Plan: Avoid frequent bending and stooping  No lifting greater than 10 lbs. May use ice or moist heat for pain. Weight loss is of benefit. Best medication for lumbar disc disease is arthritis medications like tylenol or motrin. Start  ultracet for more severe pain. Laboratory to assess for infection ongoing anemia or vitamin D deficiency.  Exercise is important to improve your indurance and does allow people to function better inspite of back pain.

## 2022-01-20 ENCOUNTER — Ambulatory Visit (INDEPENDENT_AMBULATORY_CARE_PROVIDER_SITE_OTHER): Payer: BC Managed Care – PPO | Admitting: Specialist

## 2022-01-20 ENCOUNTER — Encounter: Payer: Self-pay | Admitting: Specialist

## 2022-01-20 VITALS — BP 125/81 | HR 79 | Ht 65.0 in | Wt 175.0 lb

## 2022-01-20 DIAGNOSIS — M76891 Other specified enthesopathies of right lower limb, excluding foot: Secondary | ICD-10-CM | POA: Diagnosis not present

## 2022-01-20 DIAGNOSIS — Z981 Arthrodesis status: Secondary | ICD-10-CM

## 2022-01-20 DIAGNOSIS — M7061 Trochanteric bursitis, right hip: Secondary | ICD-10-CM | POA: Diagnosis not present

## 2022-01-20 DIAGNOSIS — M25551 Pain in right hip: Secondary | ICD-10-CM

## 2022-01-20 NOTE — Progress Notes (Signed)
   Office Visit Note   Patient: Autumn Rose           Date of Birth: 02-Apr-1966           MRN: [id number] Visit Date: 01/20/2022              Requested by: Burnis Medin, PA-C [address] [place],  [place] [address] PCP: Piedad Climes, [place], [place]   Assessment & Plan: Visit Diagnoses:  1. Pain in right hip   2. Trochanteric bursitis, right hip   3. Hip tendonitis, right   4. S/P lumbar fusion     Plan: Referral to PT at [facility] sports medicine for treatment of right hip bursitis and tendonitis. Return in 4 weeks.   Follow-Up Instructions: No follow-ups on file.   Orders:  Orders Placed This Encounter  Procedures   Ambulatory referral to Physical Therapy   No orders of the defined types were placed in this encounter.     Procedures: No procedures performed   Clinical Data: No additional findings.   Subjective: No chief complaint on file.   HPI  Review of Systems   Objective: Vital Signs: BP 125/81 (BP Location: Left Arm, Patient Position: Sitting, Cuff Size: Normal)   Pulse 79   Ht 5\' 5"  (1.651 m)   Wt 175 lb (79.4 kg)   BMI 29.12 kg/m   Physical Exam  Ortho Exam  Specialty Comments:  No specialty comments available.  Imaging: No results found.   PMFS History: Patient Active Problem List   Diagnosis Date Noted   Spondylolisthesis, lumbosacral region 09/22/2021    Priority: High    Class: Chronic   Fusion of spine of lumbar region 09/22/2021   Chest pain with moderate risk of acute coronary syndrome 09/23/2016   Abnormal EKG 09/23/2016   Dyspnea 01/11/2012   Appendicitis, acute 05/18/2011   Past Medical History:  Diagnosis Date   Anxiety    Complication of anesthesia    Bronchospasm after having tubal ligation in 2011   PONV (postoperative nausea and vomiting)     Family History  Problem Relation Age of Onset   Hypertension Mother    Hyperlipidemia Father    Cancer Father    Non-Hodgkin's  lymphoma Father    Aneurysm Maternal Grandfather    Lung cancer Paternal Grandmother    Stroke Paternal Grandfather    Epilepsy Sister    Epilepsy Sister     Past Surgical History:  Procedure Laterality Date   ABLATION     APPENDECTOMY  2012   BACK SURGERY     BILATERAL CARPAL TUNNEL RELEASE Bilateral 2021   November 30th & December 14th   BREAST IMPLANT REMOVAL     BREAST SURGERY     FOOT SURGERY     LIPOMA EXCISION Left 2005   MENISCUS REPAIR Right 2012   TUBAL LIGATION     WISDOM TOOTH EXTRACTION  1991   Social History   Occupational History   Not on file  Tobacco Use   Smoking status: Former    Types: Cigarettes    Quit date: 05/17/1986    Years since quitting: 35.7   Smokeless tobacco: Never  Vaping Use   Vaping Use: Never used  Substance and Sexual Activity   Alcohol use: Yes    Comment: occ   Drug use: No   Sexual activity: Not on file

## 2022-01-20 NOTE — Patient Instructions (Signed)
Referral to PT at [facility] sports medicine for treatment of right hip bursitis and tendonitis. Return in 4 weeks.

## 2022-02-02 ENCOUNTER — Encounter: Payer: Self-pay | Admitting: Specialist

## 2022-02-12 ENCOUNTER — Ambulatory Visit (INDEPENDENT_AMBULATORY_CARE_PROVIDER_SITE_OTHER): Payer: BC Managed Care – PPO | Admitting: Specialist

## 2022-02-12 ENCOUNTER — Encounter: Payer: Self-pay | Admitting: Specialist

## 2022-02-12 ENCOUNTER — Ambulatory Visit (INDEPENDENT_AMBULATORY_CARE_PROVIDER_SITE_OTHER): Payer: BC Managed Care – PPO

## 2022-02-12 VITALS — BP 146/88 | HR 78 | Ht 65.0 in | Wt 175.0 lb

## 2022-02-12 DIAGNOSIS — Z981 Arthrodesis status: Secondary | ICD-10-CM

## 2022-02-12 NOTE — Patient Instructions (Signed)
Avoid frequent bending and stooping  No lifting greater than 10-15 lbs. May use ice or moist heat for pain. Weight loss is of benefit. Best medication for lumbar disc disease is arthritis medications like motrin, celebrex and naprosyn. Exercise is important to improve your indurance and does allow people to function better inspite of back pain. Physical therapy for rehab and recovery of core motor strenght and ROM. Okay to return to work.

## 2022-02-12 NOTE — Progress Notes (Signed)
Office Visit Note   Patient: Autumn Rose           Date of Birth: Dec 27, 1965           MRN: [id number] Visit Date: 02/12/2022              Requested by: [place], [place], [place] [address] [place],  [place] [address] PCP: Piedad Climes, [place], PA-C   Assessment & Plan: 5 months post op L5-S1 fusion with TLIF and pedicle screws and rod, making good progress though radiographically still not definitely healed Visit Diagnoses:  1. S/P lumbar fusion   Forward bending is about 40 degrees Incision is healed Motor without focal weakness Heel and toe walking intact Tight hamstrings Radiographs today  Plan: Avoid frequent bending and stooping  No lifting greater than 10-15 lbs. May use ice or moist heat for pain. Weight loss is of benefit. Best medication for lumbar disc disease is arthritis medications like motrin, celebrex and naprosyn. Exercise is important to improve your indurance and does allow people to function better inspite of back pain. Physical therapy for rehab and recovery of core motor strenght and ROM. Okay to return to work.   Follow-Up Instructions: Return in about 4 weeks (around 03/12/2022).   Orders:  Orders Placed This Encounter  Procedures   XR Lumbar Spine 2-3 Views   No orders of the defined types were placed in this encounter.     Procedures: No procedures performed   Clinical Data: No additional findings.   Subjective: Chief Complaint  Patient presents with   Lower Back - Follow-up    56 year old female with 5 month post op Lumbosacral fusion for spondylolisthesis. 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 No bowel or bladder difficulty. Incision is healed no fever or chills. PT is to start and work on core motor strengthening and hamstring stretching and ROM and LE motor. Some intermitant  numbness along the medial plantar arch.  Review of Systems  Constitutional: Negative.   HENT: Negative.    Eyes: Negative.   Respiratory: Negative.    Cardiovascular: Negative.   Gastrointestinal: Negative.   Endocrine: Negative.   Genitourinary: Negative.   Musculoskeletal: Negative.   Skin: Negative.   Allergic/Immunologic: Negative.   Neurological: Negative.   Hematological: Negative.   Psychiatric/Behavioral: Negative.      Objective: Vital Signs: BP (!) 146/88 (BP Location: Left Arm, Patient Position: Sitting)   Pulse 78   Ht 5\' 5"  (1.651 m)   Wt 175 lb (79.4 kg)   BMI 29.12 kg/m   Physical Exam Constitutional:      Appearance: She is well-developed.  HENT:     Head: Normocephalic and atraumatic.  Eyes:     Pupils: Pupils are equal, round, and reactive to light.  Pulmonary:     Effort: Pulmonary effort is normal.     Breath sounds: Normal breath sounds.  Abdominal:     General: Bowel sounds are normal.     Palpations: Abdomen is soft.  Musculoskeletal:     Cervical back: Normal range of motion and neck supple.     Lumbar back: Negative right straight leg raise test and negative left straight leg raise test.  Skin:    General: Skin is warm and dry.  Neurological:     Mental  Status: She is alert and oriented to person, place, and time.  Psychiatric:        Behavior: Behavior normal.        Thought Content: Thought content normal.        Judgment: Judgment normal.   Back Exam   Tenderness  The patient is experiencing tenderness in the lumbar.  Range of Motion  Extension:  normal  Flexion:  abnormal  Lateral bend right:  abnormal  Lateral bend left:  abnormal  Rotation right:  abnormal  Rotation left:  abnormal   Muscle Strength  Right Quadriceps:  5/5  Left Quadriceps:  5/5  Right Hamstrings:  5/5  Left Hamstrings:  5/5   Tests  Straight leg raise right: negative Straight leg raise left: negative  Reflexes  Patellar:  2/4 Achilles:   0/4 Babinski's sign: normal   Other  Toe walk: normal Heel walk: normal Sensation: normal Gait: normal   Comments:  Ankle reflex 0 on the left and 2+ on the right.    Specialty Comments:  No specialty comments available.  Imaging: XR Lumbar Spine 2-3 Views  Result Date: 02/12/2022 AP and lateral flexion and extension radiographs show the cage and pedicle screws in good position and alignment There is about 2-3 mm difference in measurement across the anterior disc at L5-S1 with some minimal lucency about the upper interface. No gross loosening or motion and there is some assymetry to the position of the screws seen on the radiographs.    PMFS History: Patient Active Problem List   Diagnosis Date Noted   Spondylolisthesis, lumbosacral region 09/22/2021    Priority: High    Class: Chronic   Fusion of spine of lumbar region 09/22/2021   Chest pain with moderate risk of acute coronary syndrome 09/23/2016   Abnormal EKG 09/23/2016   Dyspnea 01/11/2012   Appendicitis, acute 05/18/2011   Past Medical History:  Diagnosis Date   Anxiety    Complication of anesthesia    Bronchospasm after having tubal ligation in 2011   PONV (postoperative nausea and vomiting)     Family History  Problem Relation Age of Onset   Hypertension Mother    Hyperlipidemia Father    Cancer Father    Non-Hodgkin's lymphoma Father    Aneurysm Maternal Grandfather    Lung cancer Paternal Grandmother    Stroke Paternal Grandfather    Epilepsy Sister    Epilepsy Sister     Past Surgical History:  Procedure Laterality Date   ABLATION     APPENDECTOMY  2012   BACK SURGERY     BILATERAL CARPAL TUNNEL RELEASE Bilateral 2021   November 30th & December 14th   BREAST IMPLANT REMOVAL     BREAST SURGERY     FOOT SURGERY     LIPOMA EXCISION Left 2005   MENISCUS REPAIR Right 2012   TUBAL LIGATION     WISDOM TOOTH EXTRACTION  1991   Social History   Occupational History   Not on file  Tobacco Use    Smoking status: Former    Types: Cigarettes    Quit date: 05/17/1986    Years since quitting: 35.7   Smokeless tobacco: Never  Vaping Use   Vaping Use: Never used  Substance and Sexual Activity   Alcohol use: Yes    Comment: occ   Drug use: No   Sexual activity: Not on file

## 2022-02-17 ENCOUNTER — Ambulatory Visit: Payer: BC Managed Care – PPO | Attending: Specialist | Admitting: Physical Therapy

## 2022-02-17 ENCOUNTER — Encounter: Payer: Self-pay | Admitting: Physical Therapy

## 2022-02-17 DIAGNOSIS — M76891 Other specified enthesopathies of right lower limb, excluding foot: Secondary | ICD-10-CM | POA: Diagnosis not present

## 2022-02-17 DIAGNOSIS — M7061 Trochanteric bursitis, right hip: Secondary | ICD-10-CM | POA: Insufficient documentation

## 2022-02-17 DIAGNOSIS — M25552 Pain in left hip: Secondary | ICD-10-CM | POA: Diagnosis present

## 2022-02-17 DIAGNOSIS — M25551 Pain in right hip: Secondary | ICD-10-CM | POA: Diagnosis present

## 2022-02-17 DIAGNOSIS — Z981 Arthrodesis status: Secondary | ICD-10-CM | POA: Diagnosis not present

## 2022-02-17 DIAGNOSIS — M5431 Sciatica, right side: Secondary | ICD-10-CM | POA: Insufficient documentation

## 2022-02-17 DIAGNOSIS — M5459 Other low back pain: Secondary | ICD-10-CM | POA: Diagnosis not present

## 2022-02-17 DIAGNOSIS — M5432 Sciatica, left side: Secondary | ICD-10-CM | POA: Diagnosis present

## 2022-02-17 NOTE — Therapy (Signed)
OUTPATIENT PHYSICAL THERAPY EVALUATION   Patient Name: Autumn Rose MRN: [id number] DOB:[birth year], 56 y.o., female Today's Date: 02/17/2022   PT End of Session - 02/17/22 1336     Visit Number 1    Number of Visits 24    Date for PT Re-Evaluation 05/12/22    Authorization Type [employer] reporting period from 02/17/2022    Authorization Time Period VL 30 per cal yr    Authorization - Visit Number 1    Authorization - Number of Visits 30    Progress Note Due on Visit 10    PT Start Time 1100    PT Stop Time 1200    PT Time Calculation (min) 60 min    Activity Tolerance Patient tolerated treatment well    Behavior During Therapy WFL for tasks assessed/performed             Past Medical History:  Diagnosis Date   Anxiety    Complication of anesthesia    Bronchospasm after having tubal ligation in 2011   PONV (postoperative nausea and vomiting)    Past Surgical History:  Procedure Laterality Date   ABLATION     APPENDECTOMY  2012   BACK SURGERY     BILATERAL CARPAL TUNNEL RELEASE Bilateral 2021   November 30th & December 14th   BREAST IMPLANT REMOVAL     BREAST SURGERY     FOOT SURGERY     LIPOMA EXCISION Left 2005   MENISCUS REPAIR Right 2012   TUBAL LIGATION     WISDOM TOOTH EXTRACTION  1991   Patient Active Problem List   Diagnosis Date Noted   Spondylolisthesis, lumbosacral region 09/22/2021    Class: Chronic   Fusion of spine of lumbar region 09/22/2021   Chest pain with moderate risk of acute coronary syndrome 09/23/2016   Abnormal EKG 09/23/2016   Dyspnea 01/11/2012   Appendicitis, acute 05/18/2011    PCP: Burnis MedinFulbright, Virginia E, PA-C  REFERRING PROVIDER: Kerrin ChampagneNitka, James E, MD  REFERRING DIAG: trochanteric bursitis of right hip, right hip tendonitis, s/p lumbar fusion  Rationale for Evaluation and Treatment: Rehabilitation  THERAPY DIAG:  Other low back pain  Sciatica, right side  Sciatica, left side  Pain in right  hip  Pain in left hip  ONSET DATE: surgery 09/22/2021  SUBJECTIVE:                                                                                                                                                                                           SUBJECTIVE STATEMENT: Per chart review patient underwent RIGHT L5-S1 TRANSFORAMINAL LUMBAR INTERBODY FUSION WITH SCREWS,  RODS AND CAGE, LOCAL BONE GRAFT, ALLOGRAFT BONE GRAFT AND VIVIGEN on 09/22/2021 by Dr. Kerrin Champagne for spondylolisthesis. At last follow up 02/12/2022 MD noted she is making good progress though radiographically still not definitely healed 5 months after surgery. She was also referred to PT at that time to work on core motor strengthening, hamstring stretching, ROM< and LE motor. Some intermittent numbness was noted along the medial plantar arch at that time. 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   Patient reports she had a fusion for L5-S1 with a bone graft. She states her back is not 100% but she keeps having trouble in both hips and underneath the butt-cheeks where she doesn't have a lot of strength and feels likes it is burning (difficult to explain). Right after surgery she had a shot of cortisone for bursitis on the right hip, but that didn't help more than a couple of days. Before surgery her pain was in the low back and maybe. She went to the doctor because she had pain and numbness on the right lateral lower leg, which is better since surgery. She does get some nerve pains in toes of both feet. She has a history of surgery for left plantar fasciitis and she has had symptoms there before that remind her of the nerve-like pains she is getting in her feet. She has had pain in her back for years with diagnosis of spondylolisthesis before learning it was severe enough to need surgery (when it started going down her R leg). She did not have the current burning over her bilateral hip, ischial  tuberosity, and groin that she does now. She had an emergency room visit a few days after surgery due to the amount of pain she had, but nothing was wrong. She had just not taken enough pain meds and the anesthesia completely wore off. She has a hard time going from sit to stand and feels like it is hard to get straight and get her legs moving. When she first gets up she is limping but can "walk it out." She did soccer and track as a kid.   PERTINENT HISTORY:  Patient is a 56 y.o. female who presents to outpatient physical therapy with a referral for medical diagnosis trochanteric bursitis of right hip, right hip tendonitis, s/p lumbar fusion. This patient's chief complaints consist of low back pain and bilateral glute, hip, and groin pain R > L following RIGHT L5-S1 TRANSFORAMINAL LUMBAR INTERBODY FUSION WITH SCREWS, RODS AND CAGE, LOCAL BONE GRAFT, ALLOGRAFT BONE GRAFT AND VIVIGEN on 09/22/2021 by Dr. Kerrin Champagne for spondylolisthesis leading to the following functional deficits: difficulty with usual activities including laying in bed, sleeping, walking a long ways, sitting, working, going from sitting to standing, bending, lifting, traveling, visiting family, hiking, walking up hill, stairs, spending time at kids sporting events, bed mobility. Relevant past medical history and comorbidities include CTS (hx of bilateral release, R bothering her again now), left plantar fascitis surgery, L knee surgery (meniscus surgery 2012), anxiety, appendectomy, breast augmentation and removal, former smoker.  Patient denies hx of cancer, stroke, seizures, lung problems, heart problems, diabetes, unexplained weight loss, unexplained changes in bowel or bladder problems, unexplained stumbling or dropping things, and osteoporosis.   PAIN:  Are you having pain? Yes: NPRS scale: Current: 3-4/10,  Best: 2-3/10, Worst: 9/10. Pain location: low back but most bothersome pain is in the bilateral glute, lateral hip, under  ischial tuberosity region, and into groin bilaterally, worse on R compared to L with R constant and L intermittent. Denies saddle paresthesia.  Pain description: glute/hip/groin burning/pressure Aggravating factors: getting out of bed, going from sit to stand, bending, driving, prolonged sitting, too much walking more than a couple of miles, bed mobility, coughing or sneezing.  Relieving factors: changing position, walking around   FUNCTIONAL LIMITATIONS: difficulty with usual activities including laying in bed, sleeping, walking a long ways, sitting, working, going from sitting to standing, bending, lifting, traveling, visiting family, hiking, walking up hill, stairs, spending time at kids sporting events, bed mobility.   PRECAUTIONS:  Avoid frequent bending and stooping  No lifting greater than 10-15 lbs (MD visit 02/12/2022); avoid bending/lifting/twisting. radiographically still not definitely healed 5 months after surgery.   WEIGHT BEARING RESTRICTIONS No  FALLS:  Has patient fallen in last 6 months? No  LIVING ENVIRONMENT: Lives with: lives with their spouse Lives in: apartment Stairs: No Has following equipment at home: Single point cane, Environmental consultant - 2 wheeled, Crutches, and "all kinds of stuff" but "I don't use them"  OCCUPATION: Insurance agent, sitting all day long, full time  LEISURE: go shopping, hang out with kids at sporting events, visiting [place] in the [place], going to the beach  PLOF: Independent  PATIENT GOALS: "to get that pain away" "strengthen the back and the leg"   OBJECTIVE  DIAGNOSTIC FINDINGS:  Xray report 02/12/2022: XR Lumbar Spine 2-3 Views   Result Date: 02/12/2022 AP and lateral flexion and extension radiographs show the cage and pedicle screws in good position and alignment There is about 2-3 mm difference in measurement across the anterior disc at L5-S1 with some minimal lucency about the upper interface. No gross loosening or motion and there is some  assymetry to the position of the screws seen on the radiographs.   SELF- REPORTED FUNCTION FOTO score: 63/100 (hip questionnaire)  OBSERVATION/INSPECTION Posture Posture (seated): increased lumbar lordosis Posture (standing): increased lumbar lordosis. Anthropometrics Tremor: none Body composition: BMI: 28.8 Muscle bulk: WNL Edema: none Functional Mobility Bed mobility: supine <> sit and rolling mod I for increased pain and effort, concordant back pain.  Transfers: sit <> stand mod I for increased effort/pain Gait: grossly WFL for household and short community ambulation. More detailed gait analysis deferred to later date as needed.   SPINE MOTION LUMBAR SPINE AROM *Indicates pain Flexion: fingers to mid tibia,  pulling over low back and sacrum. (ROM approx normal for patient).  Extension: 50% feels stiff Side Flexion:   R 75% feels tight, mild pain in R hip  L 75% feels tight, mild pain in R hip.  Rotation:  R 50% feels tight,  L 75% feels tight, back "cracked"  NEUROLOGICAL Upper Motor Neuron Screen Hoffman's positive bilaterally  Babinski toes down-going bilaterally Clonus (ankle) negative bilaterally.  Dermatomes L2-S2 appears equal and intact to light touch except the following: L5, S1, S2 feels slightly less sensitive on L compared to R.  Deep Tendon Reflexes R/L  3+/3+ Quadriceps reflex (L4) 1+/1+ Achilles reflex (S1)  PERIPHERAL JOINT MOTION (in degrees) ACTIVE RANGE OF MOTION (AROM) Hesitancy in flexing B hips against gravity, difficulty coordination B hip ER in supine with hip/knee flexed to 90 degrees.   PASSIVE RANGE OF MOTION (PROM) B knees and ankles appear grossly WFL.  Right hip: end range pain at glute with flexion (limited), groin with ER (excessive), and low back with IR (limited) and extension (limited). Left  hip: flexion, ER, and  IR WNL, end range pulling at anterior R thigh with flexion and IR, low back pain with end range ER. L extension  limited with R low back pain.   MUSCLE PERFORMANCE (MMT):  *Indicates pain 02/17/22 Date Date  Joint/Motion R/L R/L R/L  Hip     Flexion (L1, L2) 4*/4+ / /  Extension (knee ext) 4*/4+* / /  Abduction 4-*/4+* / /  Adduction 4/4+ / /  External rotation 4+/4 / /  Knee     Extension (L3) 5/5 / /  Flexion (S2) 5/5 / /  Ankle/Foot     Dorsiflexion (L4) 5/5 / /  Great toe extension (L5) 5/5 / /  Eversion (S1) 5/5 / /  Plantarflexion (S1) 5/5 / /  * = concordant low back and/or hip pain.   SPECIAL TESTS: LOWER LIMB NEURODYNAMIC TESTS Straight Leg Raise (Sciatic nerve) R  = positive for concordant posterior thigh pain L  = positive for concordant posterior thigh pain HIP SPECIAL TESTS FABER: R = positive for lateral hip/glute region pain "feels like my hip is going to pop out", L = positive for posterior hip pain. ER derotation test: R = negative for pain, difficulty performing; L = negative for pain, less difficulty performing.  OBER: = L negative, R = negative (painful) SIJ SPECIAL TESTS SIJ distraction: R = negative, L = negative. SIJ compression: R = negative, L = negative. Sacral thrust: R = negative, L = negative.  ACCESSORY MOTION: Deferred due to surgery with incomplete healing per radiographs.   PALPATION: TTP at bilateral glute med, B greater trochanter,  left lateral glutes, R piriformis and quadratus femoris region, R ischial tuberosity.   FUNCTIONAL/BALANCE TESTS: Sahrmann Core Stability Test: unable to perform level 1 (able to perform posterior pelvic tilt and hold it while flexing one hip, but unable to bring up both LE without losing PPT).   TODAY'S TREATMENT  Education Hooklying posterior pelvic tilt with slow marching, enough reps to teach exercise.   Pt required multimodal cuing for proper technique and to facilitate improved neuromuscular control, strength, range of motion, and functional ability resulting in improved performance and form.  PATIENT  EDUCATION:  Education details:  Exercise purpose/form. Education on diagnosis, prognosis, POC, anatomy and physiology of current condition. Education on HEP (verbally and practiced in clinic).  Reviewed cancelation/no-show policy with patient and confirmed patient has correct phone number for clinic; patient verbalized understanding (02/17/22). Person educated: Patient Education method: Explanation, Demonstration, Tactile cues, and Verbal cues Education comprehension: verbalized understanding, returned demonstration, verbal cues required, tactile cues required, and needs further education   HOME EXERCISE PROGRAM: Hooklying posterior pelvic tilt with slow marching (delivered verbally)  ASSESSMENT:  CLINICAL IMPRESSION: Patient is a 56 y.o. female referred to outpatient physical therapy with a medical diagnosis of trochanteric bursitis of right hip, right hip tendonitis, s/p lumbar fusion who presents with signs and symptoms consistent with low back pain and  bilateral lateral hip and groin pain s/p RIGHT L5-S1 TRANSFORAMINAL LUMBAR INTERBODY FUSION WITH SCREWS, RODS AND CAGE, LOCAL BONE GRAFT, ALLOGRAFT BONE GRAFT AND VIVIGEN on 09/22/2021 by Dr. Kerrin Champagne for spondylolisthesis. Patient has positive SLR test bilaterally suggesting nerve irritation contributing to symptoms. She is TTP over bilateral greater trochanters and glute med suggesting greater trochanteric pain syndrome. Patient's red flag screen negative except she is hyper-reflexive at bilateral patellar tendons and has bilateral positive hoffman's sign. These are most likely incidental findings that will be monitored during her PT sessions. Patient demonstrates  increased lumbar lordosis,  lacks hip extension bilaterally, and has poor core strength, control, and endurance, which is likely contributing to her symptoms.  Patient presents with significant pain, ROM, motor control, muscle tension, tissue integrity, posture, muscle performance  (strength/power/endurance) and activity tolerance impairments that are limiting ability to complete her usual activities such as laying in bed, sleeping, walking a long ways, sitting, working, going from sitting to standing, bending, lifting, traveling, visiting family, hiking, walking up hill, stairs, spending time at kids sporting events, bed mobility without difficulty. Patient will benefit from skilled physical therapy intervention to address current body structure impairments and activity limitations to improve function and work towards goals set in current POC in order to return to prior level of function or maximal functional improvement.    OBJECTIVE IMPAIRMENTS decreased activity tolerance, decreased coordination, decreased endurance, decreased knowledge of condition, decreased mobility, difficulty walking, decreased ROM, decreased strength, hypomobility, impaired perceived functional ability, increased muscle spasms, impaired flexibility, impaired sensation, improper body mechanics, postural dysfunction, and pain.   ACTIVITY LIMITATIONS carrying, lifting, bending, sitting, standing, squatting, sleeping, stairs, transfers, and bed mobility  PARTICIPATION LIMITATIONS: interpersonal relationship, driving, shopping, occupation, and   laying in bed, sleeping, walking a long ways, sitting, working, going from sitting to standing, bending, lifting, traveling, visiting family, hiking, walking up hill, stairs, spending time at kids sporting events, bed mobility.  PERSONAL FACTORS Past/current experiences, Time since onset of injury/illness/exacerbation, and 3+ comorbidities:   CTS (hx of bilateral release, R bothering her again now), left plantar fascitis surgery, L knee surgery (meniscus surgery 2012), anxiety, appendectomy, breast augmentation and removal, former smoker are also affecting patient's functional outcome.   REHAB POTENTIAL: Good  CLINICAL DECISION MAKING: Evolving/moderate  complexity  EVALUATION COMPLEXITY: Moderate   GOALS: Goals reviewed with patient? No  SHORT TERM GOALS: Target date: 03/03/2022  Patient will be independent with initial home exercise program for self-management of symptoms. Baseline: Initial HEP provided at IE (02/17/22); Goal status: INITIAL   LONG TERM GOALS: Target date: 05/12/2022  Patient will be independent with a long-term home exercise program for self-management of symptoms.  Baseline: Initial HEP provided at IE (02/17/22); Goal status: INITIAL  2.  Patient will demonstrate improved FOTO to equal or greater than 74 to demonstrate improvement in overall condition and self-reported functional ability.  Baseline: 63 (02/17/22); Goal status: INITIAL  3.  Patient will demonstrate ability to perform level 1 or higher on Sahrmann Core Stability Test to demonstrate improved core motor control, strength, and muscular endurance to improve her ability to perform activities such as bending, lifting, and sit <> stand with less difficulty.  Baseline: unable to perform level 1 (able to perform posterior pelvic tilt and hold it while flexing one hip, but unable to bring up both LE without losing PPT)  (02/17/22); Goal status: INITIAL  4.  Patient will demonstrate ability to squat to chair height 1x10 reps with proper form without limitation due to current condition in order to improve ability to lifting and complete sit <> stand transfers with less difficulty.  Baseline: patient reports pain with sit <> stand (02/17/22); Goal status: INITIAL  5.  Patient will complete community, work and/or recreational activities without limitation due to current condition.  Baseline: difficulty with usual activities such as laying in bed, sleeping, walking a long ways, sitting, working, going from sitting to standing, bending, lifting, traveling, visiting family, hiking, walking up hill, stairs, spending time at kids sporting events, bed mobility  (02/17/22); Goal status: INITIAL  6.  Reduce pain with functional activities to equal or less than 2/10 to allow patient to complete usual activities including working, transfers, walking, bending with less difficulty.   Baseline: up to 9/10 (02/17/2022); Goal status: INITIAL    PLAN: PT FREQUENCY: 1-2x/week  PT DURATION: 12 weeks  PLANNED INTERVENTIONS: Therapeutic exercises, Therapeutic activity, Neuromuscular re-education, Patient/Family education, Joint mobilization, Aquatic Therapy, Dry Needling, Electrical stimulation, Cryotherapy, Moist heat, Taping, Biofeedback, Manual therapy, and Re-evaluation.  PLAN FOR NEXT SESSION: update HEP, core, hip, and functional strengthening as tolerated, manual therapy as needed, consider nerve glides.    Luretha Murphy. Ilsa Iha, PT, DPT 02/17/22, 1:39 PM  [facility] [address] [place], [place] [address] P: [phone] I F: [phone]

## 2022-02-22 ENCOUNTER — Ambulatory Visit: Payer: BC Managed Care – PPO

## 2022-02-22 DIAGNOSIS — M5459 Other low back pain: Secondary | ICD-10-CM

## 2022-02-22 DIAGNOSIS — M5432 Sciatica, left side: Secondary | ICD-10-CM

## 2022-02-22 DIAGNOSIS — M5431 Sciatica, right side: Secondary | ICD-10-CM

## 2022-02-22 NOTE — Therapy (Signed)
OUTPATIENT PHYSICAL THERAPY TREATMENT FOR REHABILITATION   Patient Name: Autumn Rose MRN: [id number] DOB:Feb 06, 1966, 56 y.o., female Today's Date: 02/22/2022   PT End of Session - 02/22/22 1639     Visit Number 2    Number of Visits 24    Date for PT Re-Evaluation 05/12/22    Authorization Type [employer] reporting period from 02/17/2022; VL 30 per cal yr    Authorization Time Period 02/17/22-05/12/22    Authorization - Visit Number 2    Authorization - Number of Visits 30    Progress Note Due on Visit 10    PT Start Time 1630    PT Stop Time 1710    PT Time Calculation (min) 40 min    Activity Tolerance Patient tolerated treatment well;No increased pain    Behavior During Therapy [facility] for tasks assessed/performed             Past Medical History:  Diagnosis Date   Anxiety    Complication of anesthesia    Bronchospasm after having tubal ligation in 2011   PONV (postoperative nausea and vomiting)    Past Surgical History:  Procedure Laterality Date   ABLATION     APPENDECTOMY  2012   BACK SURGERY     BILATERAL CARPAL TUNNEL RELEASE Bilateral 2021   November 30th & December 14th   BREAST IMPLANT REMOVAL     BREAST SURGERY     FOOT SURGERY     LIPOMA EXCISION Left 2005   MENISCUS REPAIR Right 2012   TUBAL LIGATION     WISDOM TOOTH EXTRACTION  1991   Patient Active Problem List   Diagnosis Date Noted   Spondylolisthesis, lumbosacral region 09/22/2021    Class: Chronic   Fusion of spine of lumbar region 09/22/2021   Chest pain with moderate risk of acute coronary syndrome 09/23/2016   Abnormal EKG 09/23/2016   Dyspnea 01/11/2012   Appendicitis, acute 05/18/2011    PCP: Burnis Medin, PA-C  REFERRING PROVIDER: Kerrin Champagne, MD  REFERRING DIAG: trochanteric bursitis of right hip, right hip tendonitis, s/p lumbar fusion  Rationale for Evaluation and Treatment: Rehabilitation  THERAPY DIAG:  Other low back pain  Sciatica, right  side  Sciatica, left side  ONSET DATE: surgery 09/22/2021  SUBJECTIVE:                                                                                                                                                                                           SUBJECTIVE STATEMENT: A little sore today, traveled to see sister in mountains and went hiking/kayaking. HEP performed prior to leaving.  PERTINENT HISTORY:  Patient is a 56 y.o. female who presents to outpatient physical therapy with a referral for medical diagnosis trochanteric bursitis of right hip, right hip tendonitis, s/p lumbar fusion. This patient's chief complaints consist of low back pain and bilateral glute, hip, and groin pain R > L following RIGHT L5-S1 TRANSFORAMINAL LUMBAR INTERBODY FUSION WITH SCREWS, RODS AND CAGE, LOCAL BONE GRAFT, ALLOGRAFT BONE GRAFT AND VIVIGEN on 09/22/2021 by Dr. Kerrin Champagne for spondylolisthesis leading to the following functional deficits: difficulty with usual activities including laying in bed, sleeping, walking a long ways, sitting, working, going from sitting to standing, bending, lifting, traveling, visiting family, hiking, walking up hill, stairs, spending time at kids sporting events, bed mobility. Relevant past medical history and comorbidities include CTS (hx of bilateral release, R bothering her again now), left plantar fascitis surgery, L knee surgery (meniscus surgery 2012), anxiety, appendectomy, breast augmentation and removal, former smoker.  Patient denies hx of cancer, stroke, seizures, lung problems, heart problems, diabetes, unexplained weight loss, unexplained changes in bowel or bladder problems, unexplained stumbling or dropping things, and osteoporosis.   PAIN:  Are you having pain? Yes: NPRS scale: ~4-5/10.   PRECAUTIONS:  Avoid frequent bending and stooping  No lifting greater than 10-15 lbs (MD visit 02/12/2022); avoid bending/lifting/twisting. radiographically still not  definitely healed 5 months after surgery.   WEIGHT BEARING RESTRICTIONS No  FALLS:  Has patient fallen in last 6 months? No  LIVING ENVIRONMENT: Lives with: lives with their spouse Lives in: apartment Stairs: No Has following equipment at home: Single point cane, Environmental consultant - 2 wheeled, Crutches, and "all kinds of stuff" but "I don't use them"  OCCUPATION: Insurance agent, sitting all day long, full time  LEISURE: go shopping, hang out with kids at sporting events, visiting family in the mountains, going to the beach  PLOF: Independent  PATIENT GOALS: "to get that pain away" "strengthen the back and the leg"   OBJECTIVE  DIAGNOSTIC FINDINGS:  Xray report 02/12/2022: XR Lumbar Spine 2-3 Views   Result Date: 02/12/2022 AP and lateral flexion and extension radiographs show the cage and pedicle screws in good position and alignment There is about 2-3 mm difference in measurement across the anterior disc at L5-S1 with some minimal lucency about the upper interface. No gross loosening or motion and there is some assymetry to the position of the screws seen on the radiographs.     TODAY'S TREATMENT 02/22/22 -Nustep, seat 10, arms 10.5 x5 level 1  -SKTC stretch 2x30sec bilat, wide to avoid FADDIR (hypermobility)  -FABER stretch with table assistance 1x60sec bilat (hypermobile)  -reviewed posterior pelvic tilt, education on using ABD muscles rather than glute max dominant strategy  -posterior tilt/ABD set + hooklying foot lift 1x10 bilat, then single foot slide out 1x10 bilat -hooklying redTB clam 1x15x5secH  -seated ABD draw in manoover 10x5secH  -STS from elevated plinth 2x10 c ABD draw-in prior to moving    Education Exercise form  Pt required multimodal cuing for proper technique and to facilitate improved neuromuscular control, strength, range of motion, and functional ability resulting in improved performance and form.  PATIENT EDUCATION:  Education details:  Exercise  purpose/form. Education on diagnosis, prognosis, POC, anatomy and physiology of current condition. Education on HEP (verbally and practiced in clinic).  Reviewed cancelation/no-show policy with patient and confirmed patient has correct phone number for clinic; patient verbalized understanding (02/17/22). Person educated: Patient Education method: Explanation, Demonstration, Tactile cues, and Verbal cues Education comprehension: verbalized understanding,  returned demonstration, verbal cues required, tactile cues required, and needs further education   HOME EXERCISE PROGRAM: Hooklying posterior pelvic tilt with slow marching (delivered verbally)  ASSESSMENT:  CLINICAL IMPRESSION:Pt moving quite well today, discussed activity modification in the context of recent kayaking and her daily walking regimen. Pt demonstrates some generalized hypermobility in hips in session, hence stretches are modified in response. Generally good tolerance in session, slight increase in pain. No clear fatigue during session. Pt takes time to perform exercises as directed. HEP updates deferred to next session.     OBJECTIVE IMPAIRMENTS decreased activity tolerance, decreased coordination, decreased endurance, decreased knowledge of condition, decreased mobility, difficulty walking, decreased ROM, decreased strength, hypomobility, impaired perceived functional ability, increased muscle spasms, impaired flexibility, impaired sensation, improper body mechanics, postural dysfunction, and pain.   ACTIVITY LIMITATIONS carrying, lifting, bending, sitting, standing, squatting, sleeping, stairs, transfers, and bed mobility  PARTICIPATION LIMITATIONS: interpersonal relationship, driving, shopping, occupation, and   laying in bed, sleeping, walking a long ways, sitting, working, going from sitting to standing, bending, lifting, traveling, visiting family, hiking, walking up hill, stairs, spending time at kids sporting events, bed  mobility.  PERSONAL FACTORS Past/current experiences, Time since onset of injury/illness/exacerbation, and 3+ comorbidities:   CTS (hx of bilateral release, R bothering her again now), left plantar fascitis surgery, L knee surgery (meniscus surgery 2012), anxiety, appendectomy, breast augmentation and removal, former smoker are also affecting patient's functional outcome.   REHAB POTENTIAL: Good  CLINICAL DECISION MAKING: Evolving/moderate complexity  EVALUATION COMPLEXITY: Moderate   GOALS: Goals reviewed with patient? No  SHORT TERM GOALS: Target date: 03/08/2022  Patient will be independent with initial home exercise program for self-management of symptoms. Baseline: Initial HEP provided at IE (02/22/22); Goal status: INITIAL   LONG TERM GOALS: Target date: 05/17/2022  Patient will be independent with a long-term home exercise program for self-management of symptoms.  Baseline: Initial HEP provided at IE (02/22/22); Goal status: INITIAL  2.  Patient will demonstrate improved FOTO to equal or greater than 74 to demonstrate improvement in overall condition and self-reported functional ability.  Baseline: 63 (02/22/22); Goal status: INITIAL  3.  Patient will demonstrate ability to perform level 1 or higher on Sahrmann Core Stability Test to demonstrate improved core motor control, strength, and muscular endurance to improve her ability to perform activities such as bending, lifting, and sit <> stand with less difficulty.  Baseline: unable to perform level 1 (able to perform posterior pelvic tilt and hold it while flexing one hip, but unable to bring up both LE without losing PPT)  (02/22/22); Goal status: INITIAL  4.  Patient will demonstrate ability to squat to chair height 1x10 reps with proper form without limitation due to current condition in order to improve ability to lifting and complete sit <> stand transfers with less difficulty.  Baseline: patient reports pain with sit <>  stand (02/22/22); Goal status: INITIAL  5.  Patient will complete community, work and/or recreational activities without limitation due to current condition.  Baseline: difficulty with usual activities such as laying in bed, sleeping, walking a long ways, sitting, working, going from sitting to standing, bending, lifting, traveling, visiting family, hiking, walking up hill, stairs, spending time at kids sporting events, bed mobility (02/22/22); Goal status: INITIAL  6.  Reduce pain with functional activities to equal or less than 2/10 to allow patient to complete usual activities including working, transfers, walking, bending with less difficulty.   Baseline: up to 9/10 (02/17/2022); Goal status: INITIAL  PLAN: PT FREQUENCY: 1-2x/week  PT DURATION: 12 weeks  PLANNED INTERVENTIONS: Therapeutic exercises, Therapeutic activity, Neuromuscular re-education, Patient/Family education, Joint mobilization, Aquatic Therapy, Dry Needling, Electrical stimulation, Cryotherapy, Moist heat, Taping, Biofeedback, Manual therapy, and Re-evaluation.  PLAN FOR NEXT SESSION: update HEP, core, hip, and functional strengthening as tolerated, manual therapy as needed, consider nerve glides.    4:48 PM, 02/22/22 Rosamaria Lints, PT, DPT Physical Therapist - [place] [phone] (Office)  02/22/22, 4:48 PM  [facility] [address] [place], [place] [address] P: [phone] I F: [phone]

## 2022-02-24 ENCOUNTER — Ambulatory Visit: Payer: BC Managed Care – PPO

## 2022-03-01 ENCOUNTER — Ambulatory Visit: Payer: BC Managed Care – PPO

## 2022-03-31 ENCOUNTER — Encounter: Payer: Self-pay | Admitting: Specialist

## 2022-03-31 ENCOUNTER — Ambulatory Visit (INDEPENDENT_AMBULATORY_CARE_PROVIDER_SITE_OTHER): Payer: BC Managed Care – PPO

## 2022-03-31 ENCOUNTER — Ambulatory Visit (INDEPENDENT_AMBULATORY_CARE_PROVIDER_SITE_OTHER): Payer: BC Managed Care – PPO | Admitting: Specialist

## 2022-03-31 VITALS — BP 131/87 | HR 66 | Ht 65.0 in | Wt 175.0 lb

## 2022-03-31 DIAGNOSIS — M47814 Spondylosis without myelopathy or radiculopathy, thoracic region: Secondary | ICD-10-CM

## 2022-03-31 DIAGNOSIS — Z981 Arthrodesis status: Secondary | ICD-10-CM

## 2022-03-31 DIAGNOSIS — M546 Pain in thoracic spine: Secondary | ICD-10-CM | POA: Diagnosis not present

## 2022-03-31 NOTE — Patient Instructions (Signed)
Avoid frequent bending and stooping  No lifting greater than 10 lbs. May use ice or moist heat for pain. Weight loss is of benefit. Best medication for lumbar disc disease is arthritis medications like motrin, celebrex and naprosyn. Exercise is important to improve your indurance and does allow people to function better inspite of back pain.   

## 2022-03-31 NOTE — Progress Notes (Signed)
Office Visit Note   Patient: Autumn Rose           Date of Birth: [birth year]           MRN: [id number] Visit Date: 03/31/2022              Requested by: Burnis Medin, PA-C [address] [place],  [place] [address] PCP: Piedad Climes, [place], [place]   Assessment & Plan: Visit Diagnoses:  1. S/P lumbar fusion   2. Pain in thoracic spine   3. Spondylosis of thoracic spine     Plan: Avoid frequent bending and stooping  No lifting greater than 10 lbs. May use ice or moist heat for pain. Weight loss is of benefit. Best medication for lumbar disc disease is arthritis medications like motrin, celebrex and naprosyn. Exercise is important to improve your indurance and does allow people to function better inspite of back pain.  Follow-Up Instructions: Return in about 1 year (around 04/01/2023).   Orders:  Orders Placed This Encounter  Procedures   XR Lumbar Spine 2-3 Views   No orders of the defined types were placed in this encounter.     Procedures: No procedures performed   Clinical Data: No additional findings.   Subjective: Chief Complaint  Patient presents with   Lower Back - Follow-up    HPI  Review of Systems  Constitutional: Negative.   HENT: Negative.    Eyes: Negative.   Respiratory: Negative.    Cardiovascular: Negative.   Gastrointestinal: Negative.   Endocrine: Negative.   Genitourinary: Negative.   Musculoskeletal: Negative.   Skin: Negative.   Allergic/Immunologic: Negative.   Neurological: Negative.   Hematological: Negative.   Psychiatric/Behavioral: Negative.      Objective: Vital Signs: BP 131/87 (BP Location: Left Arm, Patient Position: Sitting)   Pulse 66   Ht 5\' 5"  (1.651 m)   Wt 175 lb (79.4 kg)   BMI 29.12 kg/m   Physical Exam  Ortho Exam  Specialty Comments:  No specialty comments available.  Imaging: XR Lumbar Spine 2-3 Views  Result Date: 03/31/2022 AP and lateral flexion and extension  radiographs show the hardware in good position and alignment L5-S1. No lucency there is mild DDD D9-10 through T11-12.     PMFS History: Patient Active Problem List   Diagnosis Date Noted   Spondylolisthesis, lumbosacral region 09/22/2021    Priority: High    Class: Chronic   Fusion of spine of lumbar region 09/22/2021   Chest pain with moderate risk of acute coronary syndrome 09/23/2016   Abnormal EKG 09/23/2016   Dyspnea 01/11/2012   Appendicitis, acute 05/18/2011   Past Medical History:  Diagnosis Date   Anxiety    Complication of anesthesia    Bronchospasm after having tubal ligation in 2011   PONV (postoperative nausea and vomiting)     Family History  Problem Relation Age of Onset   Hypertension Mother    Hyperlipidemia Father    Cancer Father    Non-Hodgkin's lymphoma Father    Aneurysm Maternal Grandfather    Lung cancer Paternal Grandmother    Stroke Paternal Grandfather    Epilepsy Sister    Epilepsy Sister     Past Surgical History:  Procedure Laterality Date   ABLATION     APPENDECTOMY  2012   BACK SURGERY     BILATERAL CARPAL TUNNEL RELEASE Bilateral 2021   November 30th & December 14th   BREAST IMPLANT REMOVAL  BREAST SURGERY     FOOT SURGERY     LIPOMA EXCISION Left 2005   MENISCUS REPAIR Right 2012   TUBAL LIGATION     WISDOM TOOTH EXTRACTION  1991   Social History   Occupational History   Not on file  Tobacco Use   Smoking status: Former    Types: Cigarettes    Quit date: 05/17/1986    Years since quitting: 35.8   Smokeless tobacco: Never  Vaping Use   Vaping Use: Never used  Substance and Sexual Activity   Alcohol use: Yes    Comment: occ   Drug use: No   Sexual activity: Not on file

## 2022-05-09 HISTORY — PX: OTHER SURGICAL HISTORY: SHX169

## 2022-06-09 ENCOUNTER — Emergency Department: Payer: BC Managed Care – PPO

## 2022-06-09 ENCOUNTER — Emergency Department
Admission: EM | Admit: 2022-06-09 | Discharge: 2022-06-09 | Disposition: A | Discharge status: Home / Self Care | Payer: BC Managed Care – PPO | Attending: Student in an Organized Health Care Education/Training Program | Admitting: Student in an Organized Health Care Education/Training Program

## 2022-06-09 DIAGNOSIS — M79605 Pain in left leg: Secondary | ICD-10-CM | POA: Diagnosis not present

## 2022-06-09 DIAGNOSIS — R0602 Shortness of breath: Secondary | ICD-10-CM | POA: Insufficient documentation

## 2022-06-09 LAB — CBC WITH DIFFERENTIAL/PLATELET
Abs Immature Granulocytes: 0.01 10*3/uL (ref 0.00–0.07)
Basophils Absolute: 0 10*3/uL (ref 0.0–0.1)
Basophils Relative: 1 %
Eosinophils Absolute: 0.3 10*3/uL (ref 0.0–0.5)
Eosinophils Relative: 6 %
HCT: 35.7 % — ABNORMAL LOW (ref 36.0–46.0)
Hemoglobin: 11.9 g/dL — ABNORMAL LOW (ref 12.0–15.0)
Immature Granulocytes: 0 %
Lymphocytes Relative: 34 %
Lymphs Abs: 1.5 10*3/uL (ref 0.7–4.0)
MCH: 27.8 pg (ref 26.0–34.0)
MCHC: 33.3 g/dL (ref 30.0–36.0)
MCV: 83.4 fL (ref 80.0–100.0)
Monocytes Absolute: 0.4 10*3/uL (ref 0.1–1.0)
Monocytes Relative: 8 %
Neutro Abs: 2.3 10*3/uL (ref 1.7–7.7)
Neutrophils Relative %: 51 %
Platelets: 327 10*3/uL (ref 150–400)
RBC: 4.28 MIL/uL (ref 3.87–5.11)
RDW: 13.2 % (ref 11.5–15.5)
WBC: 4.5 10*3/uL (ref 4.0–10.5)
nRBC: 0 % (ref 0.0–0.2)

## 2022-06-09 LAB — COMPREHENSIVE METABOLIC PANEL
ALT: 34 U/L (ref 0–44)
AST: 38 U/L (ref 15–41)
Albumin: 4.1 g/dL (ref 3.5–5.0)
Alkaline Phosphatase: 82 U/L (ref 38–126)
Anion gap: 8 (ref 5–15)
BUN: 10 mg/dL (ref 6–20)
CO2: 24 mmol/L (ref 22–32)
Calcium: 9.9 mg/dL (ref 8.9–10.3)
Chloride: 107 mmol/L (ref 98–111)
Creatinine, Ser: 0.77 mg/dL (ref 0.44–1.00)
GFR, Estimated: >60 mL/min (ref >60)
Glucose, Bld: 96 mg/dL (ref 70–99)
Potassium: 3.8 mmol/L (ref 3.5–5.1)
Sodium: 139 mmol/L (ref 135–145)
Total Bilirubin: 0.5 mg/dL (ref 0.3–1.2)
Total Protein: 7.5 g/dL (ref 6.5–8.1)

## 2022-06-09 LAB — TROPONIN I (HIGH SENSITIVITY)
Troponin I (High Sensitivity): <2 ng/L (ref <18)
Troponin I (High Sensitivity): 4 ng/L (ref <18)

## 2022-06-09 LAB — D-DIMER, QUANTITATIVE: D-Dimer, Quant: 0.75 ug/mL-FEU — ABNORMAL HIGH (ref 0.00–0.50)

## 2022-06-09 MED ORDER — IOHEXOL 350 MG/ML SOLN
75.0000 mL | Freq: Once | INTRAVENOUS | Status: AC | PRN
  Administered 2022-06-09: 57 mL via INTRAVENOUS

## 2022-06-09 MED ORDER — ALUM & MAG HYDROXIDE-SIMETH 200-200-20 MG/5ML PO SUSP
30.0000 mL | Freq: Once | ORAL | Status: AC
  Administered 2022-06-09: 30 mL via ORAL
  Filled 2022-06-09: qty 30

## 2022-06-09 NOTE — ED Notes (Signed)
Paramedic student stuck pt twice and paramedic stuck pt once. Pt now going to xray. Will obtain labs when pt returns.

## 2022-06-09 NOTE — ED Notes (Signed)
Pt to CT

## 2022-06-09 NOTE — ED Triage Notes (Addendum)
Pt states that she has been having sob for the past few days, states that it got worse last pm, denies any fever, pt states that she has had 2 surgeries this year and her last was oct 4th, states she had a tummy tuck, states that the last time she felt this way it was related to reflux, states that she cont to take meds for her reflux. Pt states that she has tightness in her right calf but states that she has had that prior to her surgery

## 2022-06-09 NOTE — ED Provider Notes (Signed)
[facility] Provider Note    Event Date/Time   First MD Initiated Contact with Patient 06/09/22 [phone]     (approximate)   History   Shortness of Breath   HPI  Autumn Rose is a 56 y.o. female no significant cardiopulmonary past medical history previous surgeries this past year presents to the ER for evaluation of shortness of breath over the past few days.  She denies any pain or pressure or tightness.  Denies any fevers no cough congestion.  States she does have a history of reflux feels somewhat similar but also different previous episodes.  She does not smoke.     Physical Exam   Triage Vital Signs: ED Triage Vitals  Enc Vitals Group     BP 06/09/22 0810 (!) 149/96     Pulse Rate 06/09/22 0810 68     Resp 06/09/22 0810 16     Temp 06/09/22 0810 98.1 F (36.7 C)     Temp Source 06/09/22 0810 Oral     SpO2 06/09/22 0810 100 %     Weight --      Height --      Head Circumference --      Peak Flow --      Pain Score 06/09/22 0811 2     Pain Loc --      Pain Edu? --      Excl. in [employer]? --     Most recent vital signs: Vitals:   06/09/22 0810 06/09/22 0945  BP: (!) 149/96   Pulse: 68 71  Resp: 16 14  Temp: 98.1 F (36.7 C)   SpO2: 100% 100%     Constitutional: Alert  Eyes: Conjunctivae are normal.  Head: Atraumatic. Nose: No congestion/rhinnorhea. Mouth/Throat: Mucous membranes are moist.   Neck: Painless ROM.  Cardiovascular:   Good peripheral circulation. Respiratory: Normal respiratory effort.  No retractions.  Gastrointestinal: Soft and nontender.  Musculoskeletal:  no deformity Neurologic:  MAE spontaneously. No gross focal neurologic deficits are appreciated.  Skin:  Skin is warm, dry and intact. No rash noted. Psychiatric: Mood and affect are normal. Speech and behavior are normal.    ED Results / Procedures / Treatments   Labs (all labs ordered are listed, but only abnormal results are displayed) Labs Reviewed   CBC WITH DIFFERENTIAL/PLATELET - Abnormal; Notable for the following components:      Result Value   Hemoglobin 11.9 (*)    HCT 35.7 (*)    All other components within normal limits  D-DIMER, QUANTITATIVE - Abnormal; Notable for the following components:   D-Dimer, Quant 0.75 (*)    All other components within normal limits  COMPREHENSIVE METABOLIC PANEL  TROPONIN I (HIGH SENSITIVITY)  TROPONIN I (HIGH SENSITIVITY)     EKG  ED ECG REPORT I, Merlyn Lot, the attending physician, personally viewed and interpreted this ECG.   Date: 06/09/2022  EKG Time: 8:28  Rate: 70  Rhythm: sinus  Axis: normal  Intervals: normal  ST&T Change: nonspecific t wave abn, no stemi    RADIOLOGY Please see ED Course for my review and interpretation.  I personally reviewed all radiographic images ordered to evaluate for the above acute complaints and reviewed radiology reports and findings.  These findings were personally discussed with the patient.  Please see medical record for radiology report.    PROCEDURES:  Critical Care performed: No  Procedures   MEDICATIONS ORDERED IN ED: Medications  alum & mag hydroxide-simeth (MAALOX/MYLANTA) 200-200-20  MG/5ML suspension 30 mL (30 mLs Oral Given 06/09/22 0942)  iohexol (OMNIPAQUE) 350 MG/ML injection 75 mL (57 mLs Intravenous Contrast Given 06/09/22 0951)     IMPRESSION / MDM / ASSESSMENT AND PLAN / ED COURSE  I reviewed the triage vital signs and the nursing notes.                              Differential diagnosis includes, but is not limited to, Asthma, copd, CHF, pna, ptx, malignancy, Pe, anemia  Patient presenting to the ER for evaluation of symptoms as described above.  Based on symptoms, risk factors and considered above differential, this presenting complaint could reflect a potentially life-threatening illness therefore the patient will be placed on continuous pulse oximetry and telemetry for monitoring.  Laboratory  evaluation will be sent to evaluate for the above complaints.      Clinical Course as of 06/09/22 1127  Wed Jun 09, 2022  0855 Chest x-ray on my review and interpretation does not show any evidence of infiltrate or consolidation. [PR]  1761 Patient's D-dimer is elevated will order CT angiogram as well as lower extremity duplex. [PR]  [id number] Troponin negative.  Not consistent with ACS.  No significant anemia no white count.  Renal function normal. [PR]  1008 CTA on my review and interpretation does not show any evidence of PE. [PR]  1126 Patient reassessed.  Remains well-appearing no acute distress asymptomatic at this time.  Does appear stable and appropriate for outpatient follow-up. [PR]    Clinical Course User Index [PR] Merlyn Lot, MD     FINAL CLINICAL IMPRESSION(S) / ED DIAGNOSES   Final diagnoses:  Shortness of breath     Rx / DC Orders   ED Discharge Orders     None        Note:  This document was prepared using Dragon voice recognition software and may include unintentional dictation errors.    Merlyn Lot, MD 06/09/22 [phone]

## 2022-06-09 NOTE — ED Notes (Signed)
EDP evaluated pt at bedside. Pt complains of feeling SOB "like I can't get enough air" ongoing but was worse last night. Bilate4ral calf "tightness". 2 or 3 surgeries this year. Pt in NAD, NSR on monitor.

## 2022-06-21 ENCOUNTER — Telehealth: Payer: Self-pay | Admitting: Gastroenterology

## 2022-06-21 NOTE — Telephone Encounter (Signed)
Good morning Dr Orvan Falconer  We have received a request from patient to establish care here at our practice with you.Patient would like to be see for acid reflux.  Patient was last seen with [facility] and has relocated from the area. Patient states her son sees you as well.   Records are available for review and advise on scheduling. Please review and advise on scheduling.  Thank you

## 2022-06-25 ENCOUNTER — Encounter: Payer: Self-pay | Admitting: Gastroenterology

## 2022-07-15 ENCOUNTER — Other Ambulatory Visit: Payer: Self-pay | Admitting: Neurosurgery

## 2022-07-15 DIAGNOSIS — S32009K Unspecified fracture of unspecified lumbar vertebra, subsequent encounter for fracture with nonunion: Secondary | ICD-10-CM

## 2022-07-21 ENCOUNTER — Ambulatory Visit
Admission: RE | Admit: 2022-07-21 | Discharge: 2022-07-21 | Disposition: A | Discharge status: Home / Self Care | Payer: BC Managed Care – PPO | Source: Ambulatory Visit | Attending: Neurosurgery | Admitting: Neurosurgery

## 2022-07-21 DIAGNOSIS — S32009K Unspecified fracture of unspecified lumbar vertebra, subsequent encounter for fracture with nonunion: Secondary | ICD-10-CM | POA: Diagnosis not present

## 2022-07-27 ENCOUNTER — Ambulatory Visit: Payer: BC Managed Care – PPO

## 2022-08-18 ENCOUNTER — Ambulatory Visit: Payer: BC Managed Care – PPO | Admitting: Gastroenterology

## 2022-10-01 ENCOUNTER — Other Ambulatory Visit: Payer: Self-pay | Admitting: Neurosurgery

## 2022-10-05 ENCOUNTER — Other Ambulatory Visit: Payer: Self-pay

## 2022-10-12 ENCOUNTER — Ambulatory Visit (INDEPENDENT_AMBULATORY_CARE_PROVIDER_SITE_OTHER): Payer: BC Managed Care – PPO | Admitting: Vascular Surgery

## 2022-10-12 ENCOUNTER — Encounter: Payer: Self-pay | Admitting: Vascular Surgery

## 2022-10-12 VITALS — BP 126/81 | HR 77 | Temp 97.2°F | Resp 14 | Ht 65.5 in | Wt 167.0 lb

## 2022-10-12 DIAGNOSIS — M4317 Spondylolisthesis, lumbosacral region: Secondary | ICD-10-CM

## 2022-10-12 DIAGNOSIS — M4326 Fusion of spine, lumbar region: Secondary | ICD-10-CM | POA: Diagnosis not present

## 2022-10-12 NOTE — Progress Notes (Signed)
Patient name: Autumn Rose MRN: [id number] DOB: 12-28-1965 Sex: female  REASON FOR CONSULT: Anterior spine exposure for L5-S1 ALIF  HPI: Autumn Rose is a 57 y.o. female, with history of chronic back pain who presents for preop evaluation of anterior spine exposure for L5-S1 ALIF.  She states she has had pain most of her life with radiculopathy into both legs.  She previously underwent a L5-S1 posterior fusion with Dr. Louanne Skye on 09/22/2021 last year.  Her back pain did not get better after this.  Previous abdominal surgery includes tubal ligation, appendectomy and abdominoplasty/tummy tuck as recently as October 2023.  She says she spoke to her plastic surgeon who says it is okay to proceed with anterior spine surgery from his standpoint.  Past Medical History:  Diagnosis Date   Anxiety    Complication of anesthesia    Bronchospasm after having tubal ligation in 2011   PONV (postoperative nausea and vomiting)     Past Surgical History:  Procedure Laterality Date   ABLATION     APPENDECTOMY  2012   BACK SURGERY     BILATERAL CARPAL TUNNEL RELEASE Bilateral 2021   November 30th & December 14th   BREAST IMPLANT REMOVAL     BREAST SURGERY     FOOT SURGERY     LIPOMA EXCISION Left 2005   MENISCUS REPAIR Right 2012   TUBAL LIGATION     WISDOM TOOTH EXTRACTION  1991    Family History  Problem Relation Age of Onset   Hypertension Mother    Hyperlipidemia Father    Cancer Father    Non-Hodgkin's lymphoma Father    Aneurysm Maternal Grandfather    Lung cancer Paternal Grandmother    Stroke Paternal Grandfather    Epilepsy Sister    Epilepsy Sister     SOCIAL HISTORY: Social History   Socioeconomic History   Marital status: Married    Spouse name: Not on file   Number of children: Not on file   Years of education: Not on file   Highest education level: Not on file  Occupational History   Not on file  Tobacco Use   Smoking status: Former    Types: Cigarettes     Quit date: 05/17/1986    Years since quitting: 36.4   Smokeless tobacco: Never  Vaping Use   Vaping Use: Never used  Substance and Sexual Activity   Alcohol use: Yes    Comment: occ   Drug use: No   Sexual activity: Not on file  Other Topics Concern   Not on file  Social History Narrative   Not on file   Social Determinants of Health   Financial Resource Strain: Not on file  Food Insecurity: Not on file  Transportation Needs: Not on file  Physical Activity: Not on file  Stress: Not on file  Social Connections: Not on file  Intimate Partner Violence: Not on file    Allergies  Allergen Reactions   Oxycodone Itching   Tape Rash    Current Outpatient Medications  Medication Sig Dispense Refill   Calcium Carbonate-Vitamin D (CALCIUM 600 + D PO) Take 1 tablet by mouth daily.       fish oil-omega-3 fatty acids 1000 MG capsule Take 1 g by mouth daily.       Multiple Vitamins-Minerals (MULTIVITAMIN WITH MINERALS) tablet Take 1 tablet by mouth daily.       pantoprazole (PROTONIX) 40 MG tablet Take 40 mg by mouth 2 (two)  times daily with a meal.     traMADol (ULTRAM) 50 MG tablet Take 50 mg by mouth every 6 (six) hours as needed.     traMADol-acetaminophen (ULTRACET) 37.5-325 MG tablet Take 1 tablet by mouth every 6 (six) hours as needed. 30 tablet 0   cefadroxil (DURICEF) 500 MG capsule Take 500 mg by mouth daily.     diazepam (VALIUM) 5 MG tablet Take 5 mg by mouth.     HYDROcodone-acetaminophen (NORCO/VICODIN) 5-325 MG tablet Take 1 tablet by mouth every 6 (six) hours as needed.     meloxicam (MOBIC) 7.5 MG tablet Take 7.5 mg by mouth daily.     methocarbamol (ROBAXIN) 500 MG tablet TAKE 1 TABLET BY MOUTH EVERY 6 HOURS AS NEEDED FOR MUSCLE SPASM 30 tablet 0   nitrofurantoin (MACRODANTIN) 100 MG capsule Take 100 mg by mouth.     Omeprazole 20 MG TBDD 20 mg.     phentermine (ADIPEX-P) 37.5 MG tablet Take 37.5 mg by mouth daily.     promethazine (PHENERGAN) 25 MG tablet Take 25 mg  by mouth.     sertraline (ZOLOFT) 100 MG tablet Take 50 mg by mouth daily.     No current facility-administered medications for this visit.    REVIEW OF SYSTEMS:  '[X]'$  denotes positive finding, '[ ]'$  denotes negative finding Cardiac  Comments:  Chest pain or chest pressure:    Shortness of breath upon exertion:    Short of breath when lying flat:    Irregular heart rhythm:        Vascular    Pain in calf, thigh, or hip brought on by ambulation:    Pain in feet at night that wakes you up from your sleep:     Blood clot in your veins:    Leg swelling:         Pulmonary    Oxygen at home:    Productive cough:     Wheezing:         Neurologic    Sudden weakness in arms or legs:     Sudden numbness in arms or legs:     Sudden onset of difficulty speaking or slurred speech:    Temporary loss of vision in one eye:     Problems with dizziness:         Gastrointestinal    Blood in stool:     Vomited blood:         Genitourinary    Burning when urinating:     Blood in urine:        Psychiatric    Major depression:         Hematologic    Bleeding problems:    Problems with blood clotting too easily:        Skin    Rashes or ulcers:        Constitutional    Fever or chills:      PHYSICAL EXAM: Vitals:   10/12/22 0927  BP: 126/81  Pulse: 77  Resp: 14  Temp: (!) 97.2 F (36.2 C)  TempSrc: Temporal  SpO2: 98%  Weight: 167 lb (75.8 kg)  Height: 5' 5.5" (1.664 m)    GENERAL: The patient is a well-nourished female, in no acute distress. The vital signs are documented above. CARDIAC: There is a regular rate and rhythm.  VASCULAR:  Bilateral femoral pulses palpable Bilateral DP pulses palpable Previous scar from abdominoplasty well-healed PULMONARY: No respiratory distress. ABDOMEN: Soft and non-tender. MUSCULOSKELETAL: There  are no major deformities or cyanosis. NEUROLOGIC: No focal weakness or paresthesias are detected. PSYCHIATRIC: The patient has a normal  affect.  DATA:  CT lumbar spine reviewed from 07/21/22   Assessment/Plan:  57 year old female with chronic lower back pain that presents for preop evaluation of anterior spine exposure for L5-S1 ALIF.  I reviewed her most recent CT scan and discussed a transverse incision over the left rectus muscle at the level of the disc space and then entering into the retroperitoneum by mobilizing peritoneum and left ureter across midline.  Discussed mobilizing iliac artery and vein to get the disc space exposed from the front.  Discussed risk of injury to the above structures.  All questions answered.  Look forward to assisting Dr. Annette Stable.  She has had a previous abdominoplasty last year and the incision is well-healed and she states she has spoken with her plastic surgeon who states it is okay to proceed with anterior approach.   Marty Heck, MD [facility] Office: [phone]

## 2022-10-19 ENCOUNTER — Other Ambulatory Visit: Payer: Self-pay

## 2022-10-21 NOTE — Progress Notes (Signed)
Surgical Instructions    Your procedure is scheduled on Friday, October 29, 2022.  Report to [facility] Main Entrance "A" at 5:30 A.M., then check in with the Admitting office.  Call this number if you have problems the morning of surgery:  [phone]   If you have any questions prior to your surgery date call [phone]: Open Monday-Friday 8am-4pm If you experience any cold or flu symptoms such as cough, fever, chills, shortness of breath, etc. between now and your scheduled surgery, please notify us at the above number     Remember:  Do not eat or drink after midnight the night before your surgery    Take these medicines the morning of surgery with A SIP OF WATER:  pantoprazole (PROTONIX)  sertraline (ZOLOFT)    As Needed: acetaminophen (TYLENOL)  cetirizine (ZYRTEC)  fluticasone (FLONASE)    As of today, STOP taking any Aspirin (unless otherwise instructed by your surgeon) Aleve, Naproxen, Ibuprofen, Motrin, Advil, Goody's, BC's, all herbal medications, fish oil, and all vitamins.           Do not wear jewelry or makeup Do not wear lotions, powders, perfume, or deodorant. Do not shave 48 hours prior to surgery.   Do not bring valuables to the hospital. Do not wear nail polish, gel polish, artificial nails, or any other type of covering on natural nails (fingers and toes) If you have artificial nails or gel coating that need to be removed by a nail salon, please have this removed prior to surgery. Artificial nails or gel coating may interfere with anesthesia's ability to adequately monitor your vital signs.  [facility] is not responsible for any belongings or valuables.    Do NOT Smoke (Tobacco/Vaping)  24 hours prior to your procedure  If you use a CPAP at night, you may bring your mask for your overnight stay.   Contacts, glasses, hearing aids, dentures or partials may not be worn into surgery, please bring cases for these belongings   For patients admitted to the  hospital, discharge time will be determined by your treatment team.   Patients discharged the day of surgery will not be allowed to drive home, and someone needs to stay with them for 24 hours.   SURGICAL WAITING ROOM VISITATION Patients having surgery or a procedure may have no more than 2 support people in the waiting area - these visitors may rotate.   Children under the age of 60 must have an adult with them who is not the patient. If the patient needs to stay at the hospital during part of their recovery, the visitor guidelines for inpatient rooms apply. Pre-op nurse will coordinate an appropriate time for 1 support person to accompany patient in pre-op.  This support person may not rotate.   Please refer to [email or web address] for the visitor guidelines for Inpatients (after your surgery is over and you are in a regular room).    Special instructions:    Oral Hygiene is also important to reduce your risk of infection.  Remember - BRUSH YOUR TEETH THE MORNING OF SURGERY WITH YOUR REGULAR TOOTHPASTE   [facility]- Preparing For Surgery  Before surgery, you can play an important role. Because skin is not sterile, your skin needs to be as free of germs as possible. You can reduce the number of germs on your skin by washing with CHG (chlorahexidine gluconate) Soap before surgery.  CHG is an antiseptic cleaner which kills germs and bonds with the skin to continue  killing germs even after washing.     Please do not use if you have an allergy to CHG or antibacterial soaps. If your skin becomes reddened/irritated stop using the CHG.  Do not shave (including legs and underarms) for at least 48 hours prior to first CHG shower. It is OK to shave your face.  Please follow these instructions carefully.     Shower the NIGHT BEFORE SURGERY and the MORNING OF SURGERY with CHG Soap.   If you chose to wash your hair, wash your hair first as usual  with your normal shampoo. After you shampoo, rinse your hair and body thoroughly to remove the shampoo.  Then [employer] and genitals (private parts) with your normal soap and rinse thoroughly to remove soap.  After that Use CHG Soap as you would any other liquid soap. You can apply CHG directly to the skin and wash gently with a scrungie or a clean washcloth.   Apply the CHG Soap to your body ONLY FROM THE NECK DOWN.  Do not use on open wounds or open sores. Avoid contact with your eyes, ears, mouth and genitals (private parts). Wash Face and genitals (private parts)  with your normal soap.   Wash thoroughly, paying special attention to the area where your surgery will be performed.  Thoroughly rinse your body with warm water from the neck down.  DO NOT shower/wash with your normal soap after using and rinsing off the CHG Soap.  Pat yourself dry with a CLEAN TOWEL.  Wear CLEAN PAJAMAS to bed the night before surgery  Place CLEAN SHEETS on your bed the night before your surgery  DO NOT SLEEP WITH PETS.   Day of Surgery:  Take a shower with CHG soap. Wear Clean/Comfortable clothing the morning of surgery Do not apply any deodorants/lotions.   Remember to brush your teeth WITH YOUR REGULAR TOOTHPASTE.    If you received a COVID test during your pre-op visit, it is requested that you wear a mask when out in public, stay away from anyone that may not be feeling well, and notify your surgeon if you develop symptoms. If you have been in contact with anyone that has tested positive in the last 10 days, please notify your surgeon.    Please read over the following fact sheets that you were given.

## 2022-10-22 ENCOUNTER — Encounter (HOSPITAL_COMMUNITY)
Admission: RE | Admit: 2022-10-22 | Discharge: 2022-10-22 | Disposition: A | Discharge status: Home / Self Care | Payer: BC Managed Care – PPO | Source: Ambulatory Visit | Attending: Neurosurgery | Admitting: Neurosurgery

## 2022-10-22 ENCOUNTER — Encounter (HOSPITAL_COMMUNITY): Payer: Self-pay

## 2022-10-22 ENCOUNTER — Other Ambulatory Visit: Payer: Self-pay

## 2022-10-22 VITALS — BP 121/87 | HR 76 | Temp 98.2°F | Resp 18 | Ht 65.0 in | Wt 168.7 lb

## 2022-10-22 DIAGNOSIS — Z01818 Encounter for other preprocedural examination: Secondary | ICD-10-CM | POA: Diagnosis not present

## 2022-10-22 DIAGNOSIS — Z01811 Encounter for preprocedural respiratory examination: Secondary | ICD-10-CM

## 2022-10-22 HISTORY — DX: Unspecified fracture of unspecified lumbar vertebra, subsequent encounter for fracture with nonunion: S32.009K

## 2022-10-22 LAB — TYPE AND SCREEN
ABO/RH(D): O NEG
Antibody Screen: NEGATIVE

## 2022-10-22 LAB — BASIC METABOLIC PANEL
Anion gap: 8 (ref 5–15)
BUN: 10 mg/dL (ref 6–20)
CO2: 27 mmol/L (ref 22–32)
Calcium: 9.9 mg/dL (ref 8.9–10.3)
Chloride: 103 mmol/L (ref 98–111)
Creatinine, Ser: 0.76 mg/dL (ref 0.44–1.00)
GFR, Estimated: >60 mL/min (ref >60)
Glucose, Bld: 76 mg/dL (ref 70–99)
Potassium: 4 mmol/L (ref 3.5–5.1)
Sodium: 138 mmol/L (ref 135–145)

## 2022-10-22 LAB — CBC
HCT: 40.3 % (ref 36.0–46.0)
Hemoglobin: 13.6 g/dL (ref 12.0–15.0)
MCH: 26.8 pg (ref 26.0–34.0)
MCHC: 33.7 g/dL (ref 30.0–36.0)
MCV: 79.3 fL — ABNORMAL LOW (ref 80.0–100.0)
Platelets: 316 10*3/uL (ref 150–400)
RBC: 5.08 MIL/uL (ref 3.87–5.11)
RDW: 15.7 % — ABNORMAL HIGH (ref 11.5–15.5)
WBC: 5.5 10*3/uL (ref 4.0–10.5)
nRBC: 0 % (ref 0.0–0.2)

## 2022-10-22 LAB — SURGICAL PCR SCREEN
MRSA, PCR: NEGATIVE
Staphylococcus aureus: NEGATIVE

## 2022-10-22 NOTE — Progress Notes (Signed)
PCP - Vedia Coffer Cardiologist - Denies  PPM/ICD - Denies  Chest x-ray - 06/09/22 EKG - 06/10/22 Stress Test - "Probably 7-8 years ago" no f/u with cards ECHO - 10/12/16 Cardiac Cath - Denies  Sleep Study - No OSA  DM - No  Blood Thinner Instructions: N/A Aspirin Instructions: N/A  COVID TEST-  N/I   Anesthesia review: Yes recent sinus infection in February 2024  Patient denies shortness of breath, fever, cough and chest pain at PAT appointment  Patient denies any cold, PNA, or Covid. Had sinus infection with a course of antibiotic in February of 2024. Asymptomatic today.  All instructions explained to the patient, with a verbal understanding of the material. Patient agrees to go over the instructions while at home for a better understanding. The opportunity to ask questions was provided.

## 2022-10-29 ENCOUNTER — Other Ambulatory Visit: Payer: Self-pay

## 2022-10-29 ENCOUNTER — Inpatient Hospital Stay (HOSPITAL_COMMUNITY): Payer: BC Managed Care – PPO | Admitting: Certified Registered"

## 2022-10-29 ENCOUNTER — Inpatient Hospital Stay (HOSPITAL_COMMUNITY)
Admission: RE | Disposition: A | Discharge status: Home / Self Care | Payer: Self-pay | Source: Home / Self Care | Attending: Neurosurgery

## 2022-10-29 ENCOUNTER — Inpatient Hospital Stay (HOSPITAL_COMMUNITY): Payer: BC Managed Care – PPO

## 2022-10-29 ENCOUNTER — Encounter (HOSPITAL_COMMUNITY): Payer: Self-pay | Admitting: Neurosurgery

## 2022-10-29 ENCOUNTER — Inpatient Hospital Stay (HOSPITAL_COMMUNITY)
Admission: RE | Admit: 2022-10-29 | Discharge: 2022-10-30 | DRG: 454 | Disposition: A | Discharge status: Home / Self Care | Payer: BC Managed Care – PPO | Attending: Neurosurgery | Admitting: Neurosurgery

## 2022-10-29 ENCOUNTER — Inpatient Hospital Stay (HOSPITAL_COMMUNITY): Payer: BC Managed Care – PPO | Admitting: Physician Assistant

## 2022-10-29 DIAGNOSIS — G8929 Other chronic pain: Secondary | ICD-10-CM | POA: Diagnosis present

## 2022-10-29 DIAGNOSIS — Z91048 Other nonmedicinal substance allergy status: Secondary | ICD-10-CM | POA: Diagnosis not present

## 2022-10-29 DIAGNOSIS — M960 Pseudarthrosis after fusion or arthrodesis: Secondary | ICD-10-CM | POA: Diagnosis present

## 2022-10-29 DIAGNOSIS — F419 Anxiety disorder, unspecified: Secondary | ICD-10-CM | POA: Diagnosis present

## 2022-10-29 DIAGNOSIS — M5417 Radiculopathy, lumbosacral region: Secondary | ICD-10-CM | POA: Diagnosis present

## 2022-10-29 DIAGNOSIS — T84038A Mechanical loosening of other internal prosthetic joint, initial encounter: Secondary | ICD-10-CM | POA: Diagnosis present

## 2022-10-29 DIAGNOSIS — Z820 Family history of epilepsy and other diseases of the nervous system: Secondary | ICD-10-CM

## 2022-10-29 DIAGNOSIS — Y828 Other medical devices associated with adverse incidents: Secondary | ICD-10-CM | POA: Diagnosis present

## 2022-10-29 DIAGNOSIS — Z791 Long term (current) use of non-steroidal anti-inflammatories (NSAID): Secondary | ICD-10-CM

## 2022-10-29 DIAGNOSIS — Z823 Family history of stroke: Secondary | ICD-10-CM | POA: Diagnosis not present

## 2022-10-29 DIAGNOSIS — Z885 Allergy status to narcotic agent status: Secondary | ICD-10-CM

## 2022-10-29 DIAGNOSIS — Z8249 Family history of ischemic heart disease and other diseases of the circulatory system: Secondary | ICD-10-CM | POA: Diagnosis not present

## 2022-10-29 DIAGNOSIS — Z87891 Personal history of nicotine dependence: Secondary | ICD-10-CM

## 2022-10-29 DIAGNOSIS — Z807 Family history of other malignant neoplasms of lymphoid, hematopoietic and related tissues: Secondary | ICD-10-CM

## 2022-10-29 DIAGNOSIS — M4317 Spondylolisthesis, lumbosacral region: Secondary | ICD-10-CM | POA: Diagnosis present

## 2022-10-29 DIAGNOSIS — S32009K Unspecified fracture of unspecified lumbar vertebra, subsequent encounter for fracture with nonunion: Principal | ICD-10-CM

## 2022-10-29 DIAGNOSIS — Z801 Family history of malignant neoplasm of trachea, bronchus and lung: Secondary | ICD-10-CM

## 2022-10-29 DIAGNOSIS — Z79899 Other long term (current) drug therapy: Secondary | ICD-10-CM

## 2022-10-29 DIAGNOSIS — Y838 Other surgical procedures as the cause of abnormal reaction of the patient, or of later complication, without mention of misadventure at the time of the procedure: Secondary | ICD-10-CM | POA: Diagnosis present

## 2022-10-29 DIAGNOSIS — Z9851 Tubal ligation status: Secondary | ICD-10-CM

## 2022-10-29 DIAGNOSIS — Z83438 Family history of other disorder of lipoprotein metabolism and other lipidemia: Secondary | ICD-10-CM | POA: Diagnosis not present

## 2022-10-29 HISTORY — PX: ANTERIOR LUMBAR FUSION: SHX1170

## 2022-10-29 HISTORY — PX: ABDOMINAL EXPOSURE: SHX5708

## 2022-10-29 HISTORY — PX: LAMINECTOMY WITH POSTERIOR LATERAL ARTHRODESIS LEVEL 1: SHX6335

## 2022-10-29 SURGERY — ANTERIOR LUMBAR FUSION 1 LEVEL
Anesthesia: General | Site: Back

## 2022-10-29 MED ORDER — HYDROMORPHONE HCL 1 MG/ML IJ SOLN
INTRAMUSCULAR | Status: AC
  Filled 2022-10-29: qty 0.5

## 2022-10-29 MED ORDER — THROMBIN 20000 UNITS EX SOLR
CUTANEOUS | Status: AC
  Filled 2022-10-29: qty 20000

## 2022-10-29 MED ORDER — HYDROMORPHONE HCL 1 MG/ML IJ SOLN
0.2500 mg | INTRAMUSCULAR | Status: DC | PRN
  Administered 2022-10-29: 0.25 mg via INTRAVENOUS
  Administered 2022-10-29: .25 mg via INTRAVENOUS
  Administered 2022-10-29: 0.5 mg via INTRAVENOUS
  Administered 2022-10-29: 0.25 mg via INTRAVENOUS
  Administered 2022-10-29: .25 mg via INTRAVENOUS
  Administered 2022-10-29 (×2): 0.25 mg via INTRAVENOUS

## 2022-10-29 MED ORDER — CHLORHEXIDINE GLUCONATE CLOTH 2 % EX PADS: 6.0000 | MEDICATED_PAD | Freq: Once | CUTANEOUS | Status: DC

## 2022-10-29 MED ORDER — FENTANYL CITRATE (PF) 250 MCG/5ML IJ SOLN
INTRAMUSCULAR | Status: AC
  Filled 2022-10-29: qty 5

## 2022-10-29 MED ORDER — THROMBIN 20000 UNITS EX SOLR: CUTANEOUS | Status: DC | PRN

## 2022-10-29 MED ORDER — VANCOMYCIN HCL 1000 MG IV SOLR
INTRAVENOUS | Status: DC | PRN
  Administered 2022-10-29: 1000 mg

## 2022-10-29 MED ORDER — PANTOPRAZOLE SODIUM 40 MG PO TBEC
40.0000 mg | DELAYED_RELEASE_TABLET | Freq: Two times a day (BID) | ORAL | Status: DC
  Administered 2022-10-29 – 2022-10-30 (×2): 40 mg via ORAL
  Filled 2022-10-29 (×2): qty 1

## 2022-10-29 MED ORDER — CEFAZOLIN SODIUM-DEXTROSE 1-4 GM/50ML-% IV SOLN
1.0000 g | Freq: Three times a day (TID) | INTRAVENOUS | Status: AC
  Administered 2022-10-29 – 2022-10-30 (×2): 1 g via INTRAVENOUS
  Filled 2022-10-29 (×2): qty 50

## 2022-10-29 MED ORDER — FLUTICASONE PROPIONATE 50 MCG/ACT NA SUSP: 1.0000 | Freq: Every day | NASAL | Status: DC | PRN

## 2022-10-29 MED ORDER — EPHEDRINE 5 MG/ML INJ
INTRAVENOUS | Status: AC
  Filled 2022-10-29: qty 10

## 2022-10-29 MED ORDER — SCOPOLAMINE 1 MG/3DAYS TD PT72
MEDICATED_PATCH | TRANSDERMAL | Status: AC
  Filled 2022-10-29: qty 1

## 2022-10-29 MED ORDER — ONDANSETRON HCL 4 MG PO TABS: 4.0000 mg | ORAL_TABLET | Freq: Four times a day (QID) | ORAL | Status: DC | PRN

## 2022-10-29 MED ORDER — MIDAZOLAM HCL 2 MG/2ML IJ SOLN
INTRAMUSCULAR | Status: AC
  Filled 2022-10-29: qty 2

## 2022-10-29 MED ORDER — CHLORHEXIDINE GLUCONATE 0.12 % MT SOLN: 15.0000 mL | Freq: Once | OROMUCOSAL | Status: AC

## 2022-10-29 MED ORDER — OMEGA-3-ACID ETHYL ESTERS 1 G PO CAPS
1.0000 g | ORAL_CAPSULE | Freq: Every day | ORAL | Status: DC
  Administered 2022-10-30: 1 g via ORAL
  Filled 2022-10-29: qty 1

## 2022-10-29 MED ORDER — DIAZEPAM 5 MG PO TABS
5.0000 mg | ORAL_TABLET | Freq: Four times a day (QID) | ORAL | Status: DC | PRN
  Administered 2022-10-29 – 2022-10-30 (×2): 5 mg via ORAL
  Filled 2022-10-29 (×2): qty 1

## 2022-10-29 MED ORDER — SODIUM CHLORIDE 0.9% FLUSH
3.0000 mL | Freq: Two times a day (BID) | INTRAVENOUS | Status: DC
  Administered 2022-10-29 (×2): 3 mL via INTRAVENOUS

## 2022-10-29 MED ORDER — KETOROLAC TROMETHAMINE 30 MG/ML IJ SOLN
30.0000 mg | Freq: Once | INTRAMUSCULAR | Status: AC | PRN
  Administered 2022-10-29: 30 mg via INTRAVENOUS

## 2022-10-29 MED ORDER — ALBUMIN HUMAN 5 % IV SOLN: INTRAVENOUS | Status: DC | PRN

## 2022-10-29 MED ORDER — ACETAMINOPHEN 650 MG RE SUPP: 650.0000 mg | RECTAL | Status: DC | PRN

## 2022-10-29 MED ORDER — PHENYLEPHRINE 80 MCG/ML (10ML) SYRINGE FOR IV PUSH (FOR BLOOD PRESSURE SUPPORT)
PREFILLED_SYRINGE | INTRAVENOUS | Status: DC | PRN
  Administered 2022-10-29: 80 ug via INTRAVENOUS
  Administered 2022-10-29: 40 ug via INTRAVENOUS
  Administered 2022-10-29: 80 ug via INTRAVENOUS

## 2022-10-29 MED ORDER — LIDOCAINE 2% (20 MG/ML) 5 ML SYRINGE
INTRAMUSCULAR | Status: DC | PRN
  Administered 2022-10-29: 60 mg via INTRAVENOUS

## 2022-10-29 MED ORDER — PROPOFOL 10 MG/ML IV BOLUS
INTRAVENOUS | Status: DC | PRN
  Administered 2022-10-29: 200 mg via INTRAVENOUS

## 2022-10-29 MED ORDER — PHENYLEPHRINE HCL-NACL 20-0.9 MG/250ML-% IV SOLN
INTRAVENOUS | Status: AC
  Filled 2022-10-29: qty 250

## 2022-10-29 MED ORDER — SCOPOLAMINE 1 MG/3DAYS TD PT72
MEDICATED_PATCH | TRANSDERMAL | Status: DC | PRN
  Administered 2022-10-29: 1 via TRANSDERMAL

## 2022-10-29 MED ORDER — CEFAZOLIN SODIUM-DEXTROSE 2-4 GM/100ML-% IV SOLN
INTRAVENOUS | Status: AC
  Filled 2022-10-29: qty 100

## 2022-10-29 MED ORDER — CEFAZOLIN SODIUM-DEXTROSE 2-4 GM/100ML-% IV SOLN
2.0000 g | INTRAVENOUS | Status: AC
  Administered 2022-10-29: 2 g via INTRAVENOUS

## 2022-10-29 MED ORDER — ALUM & MAG HYDROXIDE-SIMETH 200-200-20 MG/5ML PO SUSP
30.0000 mL | Freq: Four times a day (QID) | ORAL | Status: DC | PRN
  Administered 2022-10-29: 30 mL via ORAL
  Filled 2022-10-29: qty 30

## 2022-10-29 MED ORDER — DEXAMETHASONE SODIUM PHOSPHATE 10 MG/ML IJ SOLN
INTRAMUSCULAR | Status: DC | PRN
  Administered 2022-10-29: 10 mg via INTRAVENOUS

## 2022-10-29 MED ORDER — PROPOFOL 10 MG/ML IV BOLUS
INTRAVENOUS | Status: AC
  Filled 2022-10-29: qty 20

## 2022-10-29 MED ORDER — ONDANSETRON HCL 4 MG/2ML IJ SOLN
INTRAMUSCULAR | Status: AC
  Filled 2022-10-29: qty 2

## 2022-10-29 MED ORDER — MEPERIDINE HCL 25 MG/ML IJ SOLN: 6.2500 mg | INTRAMUSCULAR | Status: DC | PRN

## 2022-10-29 MED ORDER — LIDOCAINE 2% (20 MG/ML) 5 ML SYRINGE
INTRAMUSCULAR | Status: AC
  Filled 2022-10-29: qty 5

## 2022-10-29 MED ORDER — LACTATED RINGERS IV SOLN: INTRAVENOUS | Status: DC

## 2022-10-29 MED ORDER — KETAMINE HCL 10 MG/ML IJ SOLN
INTRAMUSCULAR | Status: DC | PRN
  Administered 2022-10-29 (×3): 10 mg via INTRAVENOUS

## 2022-10-29 MED ORDER — SUGAMMADEX SODIUM 200 MG/2ML IV SOLN
INTRAVENOUS | Status: DC | PRN
  Administered 2022-10-29 (×2): 50 mg via INTRAVENOUS

## 2022-10-29 MED ORDER — FENTANYL CITRATE (PF) 100 MCG/2ML IJ SOLN
INTRAMUSCULAR | Status: DC | PRN
  Administered 2022-10-29: 100 ug via INTRAVENOUS
  Administered 2022-10-29 (×3): 50 ug via INTRAVENOUS

## 2022-10-29 MED ORDER — LORATADINE 10 MG PO TABS
10.0000 mg | ORAL_TABLET | Freq: Every day | ORAL | Status: DC
  Administered 2022-10-29 – 2022-10-30 (×2): 10 mg via ORAL
  Filled 2022-10-29 (×2): qty 1

## 2022-10-29 MED ORDER — DEXAMETHASONE SODIUM PHOSPHATE 10 MG/ML IJ SOLN
INTRAMUSCULAR | Status: AC
  Filled 2022-10-29: qty 1

## 2022-10-29 MED ORDER — KETAMINE HCL 50 MG/5ML IJ SOSY
PREFILLED_SYRINGE | INTRAMUSCULAR | Status: AC
  Filled 2022-10-29: qty 5

## 2022-10-29 MED ORDER — BUPIVACAINE HCL (PF) 0.25 % IJ SOLN
INTRAMUSCULAR | Status: AC
  Filled 2022-10-29: qty 30

## 2022-10-29 MED ORDER — ACETAMINOPHEN 500 MG PO TABS
ORAL_TABLET | ORAL | Status: AC
  Administered 2022-10-29: 1000 mg via ORAL
  Filled 2022-10-29: qty 2

## 2022-10-29 MED ORDER — 0.9 % SODIUM CHLORIDE (POUR BTL) OPTIME
TOPICAL | Status: DC | PRN
  Administered 2022-10-29: 1000 mL

## 2022-10-29 MED ORDER — ROCURONIUM BROMIDE 10 MG/ML (PF) SYRINGE
PREFILLED_SYRINGE | INTRAVENOUS | Status: AC
  Filled 2022-10-29: qty 10

## 2022-10-29 MED ORDER — MENTHOL 3 MG MT LOZG: 1.0000 | LOZENGE | OROMUCOSAL | Status: DC | PRN

## 2022-10-29 MED ORDER — HYDROCODONE-ACETAMINOPHEN 7.5-325 MG PO TABS
ORAL_TABLET | ORAL | Status: AC
  Filled 2022-10-29: qty 1

## 2022-10-29 MED ORDER — AMISULPRIDE (ANTIEMETIC) 5 MG/2ML IV SOLN: 10.0000 mg | Freq: Once | INTRAVENOUS | Status: DC | PRN

## 2022-10-29 MED ORDER — SERTRALINE HCL 50 MG PO TABS
50.0000 mg | ORAL_TABLET | Freq: Every day | ORAL | Status: DC
  Administered 2022-10-30: 50 mg via ORAL
  Filled 2022-10-29: qty 1

## 2022-10-29 MED ORDER — ONDANSETRON HCL 4 MG/2ML IJ SOLN: 4.0000 mg | Freq: Once | INTRAMUSCULAR | Status: DC | PRN

## 2022-10-29 MED ORDER — LACTATED RINGERS IV SOLN: INTRAVENOUS | Status: DC | PRN

## 2022-10-29 MED ORDER — HYDROMORPHONE HCL 1 MG/ML IJ SOLN: 1.0000 mg | INTRAMUSCULAR | Status: DC | PRN

## 2022-10-29 MED ORDER — SODIUM CHLORIDE 0.9% FLUSH: 3.0000 mL | INTRAVENOUS | Status: DC | PRN

## 2022-10-29 MED ORDER — PHENYLEPHRINE 80 MCG/ML (10ML) SYRINGE FOR IV PUSH (FOR BLOOD PRESSURE SUPPORT)
PREFILLED_SYRINGE | INTRAVENOUS | Status: AC
  Filled 2022-10-29: qty 20

## 2022-10-29 MED ORDER — ONDANSETRON HCL 4 MG/2ML IJ SOLN: 4.0000 mg | Freq: Four times a day (QID) | INTRAMUSCULAR | Status: DC | PRN

## 2022-10-29 MED ORDER — HYDROCODONE-ACETAMINOPHEN 7.5-325 MG PO TABS
1.0000 | ORAL_TABLET | Freq: Once | ORAL | Status: AC | PRN
  Administered 2022-10-29: 1 via ORAL

## 2022-10-29 MED ORDER — FLEET ENEMA 7-19 GM/118ML RE ENEM: 1.0000 | ENEMA | Freq: Once | RECTAL | Status: DC | PRN

## 2022-10-29 MED ORDER — PHENOL 1.4 % MT LIQD: 1.0000 | OROMUCOSAL | Status: DC | PRN

## 2022-10-29 MED ORDER — ORAL CARE MOUTH RINSE: 15.0000 mL | Freq: Once | OROMUCOSAL | Status: AC

## 2022-10-29 MED ORDER — HYDROCODONE-ACETAMINOPHEN 10-325 MG PO TABS: 1.0000 | ORAL_TABLET | ORAL | Status: DC | PRN

## 2022-10-29 MED ORDER — HYDROMORPHONE HCL 1 MG/ML IJ SOLN
INTRAMUSCULAR | Status: AC
  Filled 2022-10-29: qty 1

## 2022-10-29 MED ORDER — OXYCODONE HCL 5 MG PO TABS
10.0000 mg | ORAL_TABLET | ORAL | Status: DC | PRN
  Administered 2022-10-29 – 2022-10-30 (×5): 10 mg via ORAL
  Filled 2022-10-29 (×5): qty 2

## 2022-10-29 MED ORDER — CHLORHEXIDINE GLUCONATE 0.12 % MT SOLN
OROMUCOSAL | Status: AC
  Administered 2022-10-29: 15 mL via OROMUCOSAL
  Filled 2022-10-29: qty 15

## 2022-10-29 MED ORDER — SODIUM CHLORIDE 0.9 % IV SOLN
250.0000 mL | INTRAVENOUS | Status: DC
  Administered 2022-10-29: 250 mL via INTRAVENOUS

## 2022-10-29 MED ORDER — BISACODYL 10 MG RE SUPP: 10.0000 mg | Freq: Every day | RECTAL | Status: DC | PRN

## 2022-10-29 MED ORDER — ADULT MULTIVITAMIN W/MINERALS CH
1.0000 | ORAL_TABLET | Freq: Every day | ORAL | Status: DC
  Administered 2022-10-29 – 2022-10-30 (×2): 1 via ORAL
  Filled 2022-10-29 (×2): qty 1

## 2022-10-29 MED ORDER — OYSTER SHELL CALCIUM/D3 500-5 MG-MCG PO TABS
1.0000 | ORAL_TABLET | Freq: Every day | ORAL | Status: DC
  Administered 2022-10-29 – 2022-10-30 (×2): 1 via ORAL
  Filled 2022-10-29 (×2): qty 1

## 2022-10-29 MED ORDER — ACETAMINOPHEN 325 MG PO TABS
650.0000 mg | ORAL_TABLET | ORAL | Status: DC | PRN
  Administered 2022-10-29 – 2022-10-30 (×5): 650 mg via ORAL
  Filled 2022-10-29 (×5): qty 2

## 2022-10-29 MED ORDER — KETOROLAC TROMETHAMINE 30 MG/ML IJ SOLN
INTRAMUSCULAR | Status: AC
  Filled 2022-10-29: qty 1

## 2022-10-29 MED ORDER — PROPOFOL 1000 MG/100ML IV EMUL
INTRAVENOUS | Status: AC
  Filled 2022-10-29: qty 100

## 2022-10-29 MED ORDER — POLYETHYLENE GLYCOL 3350 17 G PO PACK
17.0000 g | PACK | Freq: Every day | ORAL | Status: DC | PRN
  Administered 2022-10-29: 17 g via ORAL
  Filled 2022-10-29: qty 1

## 2022-10-29 MED ORDER — ONDANSETRON HCL 4 MG/2ML IJ SOLN
INTRAMUSCULAR | Status: DC | PRN
  Administered 2022-10-29: 4 mg via INTRAVENOUS

## 2022-10-29 MED ORDER — ACETAMINOPHEN 500 MG PO TABS: 1000.0000 mg | ORAL_TABLET | Freq: Once | ORAL | Status: AC

## 2022-10-29 MED ORDER — BUPIVACAINE HCL (PF) 0.25 % IJ SOLN
INTRAMUSCULAR | Status: DC | PRN
  Administered 2022-10-29: 20 mL

## 2022-10-29 MED ORDER — VANCOMYCIN HCL 1000 MG IV SOLR
INTRAVENOUS | Status: AC
  Filled 2022-10-29: qty 20

## 2022-10-29 MED ORDER — ROCURONIUM BROMIDE 10 MG/ML (PF) SYRINGE
PREFILLED_SYRINGE | INTRAVENOUS | Status: DC | PRN
  Administered 2022-10-29: 100 mg via INTRAVENOUS
  Administered 2022-10-29: 30 mg via INTRAVENOUS

## 2022-10-29 MED ORDER — MIDAZOLAM HCL 2 MG/2ML IJ SOLN
INTRAMUSCULAR | Status: DC | PRN
  Administered 2022-10-29: 2 mg via INTRAVENOUS

## 2022-10-29 SURGICAL SUPPLY — 98 items
ADH SKN CLS APL DERMABOND .7 (GAUZE/BANDAGES/DRESSINGS) ×4
ANCH SPNL 25 LMBR MIS (Anchor) ×6 IMPLANT
ANCHOR LUMBAR 25 MIS (Anchor) ×12 IMPLANT
APL SKNCLS STERI-STRIP NONHPOA (GAUZE/BANDAGES/DRESSINGS) ×2
APPLIER CLIP 11 MED OPEN (CLIP) ×4
APR CLP MED 11 20 MLT OPN (CLIP) ×4
BAG COUNTER SPONGE SURGICOUNT (BAG) ×9 IMPLANT
BAG DECANTER FOR FLEXI CONT (MISCELLANEOUS) ×6 IMPLANT
BAG SPNG CNTER NS LX DISP (BAG) ×6
BENZOIN TINCTURE PRP APPL 2/3 (GAUZE/BANDAGES/DRESSINGS) ×3 IMPLANT
BLADE CLIPPER SURG (BLADE) IMPLANT
BUR CUTTER 7.0 ROUND (BURR) ×3 IMPLANT
BUR MATCHSTICK NEURO 3.0 LAGG (BURR) ×3 IMPLANT
CANISTER SUCT 3000ML PPV (MISCELLANEOUS) ×6 IMPLANT
CAP LOCKING THREADED (Cap) IMPLANT
CATH FOLEY LATEX FREE 14FR (CATHETERS) ×2
CATH FOLEY LF 14FR (CATHETERS) IMPLANT
CLIP APPLIE 11 MED OPEN (CLIP) ×6 IMPLANT
CLIP LIGATING EXTRA MED SLVR (CLIP) IMPLANT
CNTNR URN SCR LID CUP LEK RST (MISCELLANEOUS) ×3 IMPLANT
CONT SPEC 4OZ STRL OR WHT (MISCELLANEOUS) ×2
COVER BACK TABLE 60X90IN (DRAPES) ×3 IMPLANT
DERMABOND ADVANCED .7 DNX12 (GAUZE/BANDAGES/DRESSINGS) ×3 IMPLANT
DRAPE C-ARM 42X72 X-RAY (DRAPES) ×15 IMPLANT
DRAPE HALF SHEET 40X57 (DRAPES) IMPLANT
DRAPE LAPAROTOMY 100X72X124 (DRAPES) ×6 IMPLANT
DRAPE SURG 17X23 STRL (DRAPES) ×12 IMPLANT
DRSG OPSITE POSTOP 4X8 (GAUZE/BANDAGES/DRESSINGS) IMPLANT
DURAPREP 26ML APPLICATOR (WOUND CARE) ×3 IMPLANT
ELECT BLADE 4.0 EZ CLEAN MEGAD (MISCELLANEOUS) ×2
ELECT BLADE 6.5 EXT (BLADE) ×3 IMPLANT
ELECT REM PT RETURN 9FT ADLT (ELECTROSURGICAL) ×4
ELECTRODE BLDE 4.0 EZ CLN MEGD (MISCELLANEOUS) ×3 IMPLANT
ELECTRODE REM PT RTRN 9FT ADLT (ELECTROSURGICAL) ×6 IMPLANT
EVACUATOR 1/8 PVC DRAIN (DRAIN) ×3 IMPLANT
GAUZE 4X4 16PLY ~~LOC~~+RFID DBL (SPONGE) IMPLANT
GAUZE SPONGE 4X4 12PLY STRL (GAUZE/BANDAGES/DRESSINGS) ×6 IMPLANT
GLOVE BIO SURGEON STRL SZ7.5 (GLOVE) ×3 IMPLANT
GLOVE BIOGEL PI IND STRL 8 (GLOVE) ×3 IMPLANT
GLOVE ECLIPSE 9.0 STRL (GLOVE) ×9 IMPLANT
GLOVE EXAM NITRILE XL STR (GLOVE) IMPLANT
GOWN STRL REUS W/ TWL LRG LVL3 (GOWN DISPOSABLE) ×3 IMPLANT
GOWN STRL REUS W/ TWL XL LVL3 (GOWN DISPOSABLE) ×12 IMPLANT
GOWN STRL REUS W/TWL 2XL LVL3 (GOWN DISPOSABLE) IMPLANT
GOWN STRL REUS W/TWL LRG LVL3 (GOWN DISPOSABLE) ×2
GOWN STRL REUS W/TWL XL LVL3 (GOWN DISPOSABLE) ×8
GRAFT TRINITY ELITE LGE HUMAN (Tissue) IMPLANT
INSERT FOGARTY 61MM (MISCELLANEOUS) IMPLANT
INSERT FOGARTY SM (MISCELLANEOUS) IMPLANT
KIT BASIN OR (CUSTOM PROCEDURE TRAY) ×6 IMPLANT
KIT INFUSE SMALL (Orthopedic Implant) IMPLANT
KIT TURNOVER KIT B (KITS) ×3 IMPLANT
NDL SPNL 18GX3.5 QUINCKE PK (NEEDLE) ×3 IMPLANT
NEEDLE HYPO 22GX1.5 SAFETY (NEEDLE) ×6 IMPLANT
NEEDLE SPNL 18GX3.5 QUINCKE PK (NEEDLE) ×2 IMPLANT
NS IRRIG 1000ML POUR BTL (IV SOLUTION) ×6 IMPLANT
PACK LAMINECTOMY NEURO (CUSTOM PROCEDURE TRAY) ×6 IMPLANT
PAD ARMBOARD 7.5X6 YLW CONV (MISCELLANEOUS) ×6 IMPLANT
ROD 40MM SPINAL (Rod) IMPLANT
SCREW AMP MODULAR CREO 6.5X45 (Screw) IMPLANT
SCREW CREO 6.5X40 (Screw) ×2 IMPLANT
SCREW CREO MODULAR 8.5X45 (Screw) IMPLANT
SCREW CREO SHAFT 8.5X40 (Screw) IMPLANT
SCREW PA THRD CREO TULIP 5.5X4 (Head) IMPLANT
SOL ELECTROSURG ANTI STICK (MISCELLANEOUS) ×4
SOLUTION ELECTROSURG ANTI STCK (MISCELLANEOUS) ×6 IMPLANT
SPACER HEDRON IA 26X34X15 15D (Spacer) IMPLANT
SPIKE FLUID TRANSFER (MISCELLANEOUS) ×3 IMPLANT
SPONGE INTESTINAL PEANUT (DISPOSABLE) ×9 IMPLANT
SPONGE SURGIFOAM ABS GEL 100 (HEMOSTASIS) ×9 IMPLANT
SPONGE T-LAP 18X18 ~~LOC~~+RFID (SPONGE) ×3 IMPLANT
STAPLER VISISTAT 35W (STAPLE) ×3 IMPLANT
STRIP CLOSURE SKIN 1/2X4 (GAUZE/BANDAGES/DRESSINGS) ×6 IMPLANT
SUT PROLENE 4 0 RB 1 (SUTURE)
SUT PROLENE 4-0 RB1 .5 CRCL 36 (SUTURE) IMPLANT
SUT PROLENE 5 0 CC1 (SUTURE) IMPLANT
SUT PROLENE 6 0 C 1 30 (SUTURE) IMPLANT
SUT PROLENE 6 0 CC (SUTURE) IMPLANT
SUT SILK 0 TIES 10X30 (SUTURE) IMPLANT
SUT SILK 2 0 TIES 10X30 (SUTURE) ×3 IMPLANT
SUT SILK 2 0SH CR/8 30 (SUTURE) IMPLANT
SUT SILK 3 0 SH CR/8 (SUTURE) IMPLANT
SUT SILK 3 0 TIES 17X18 (SUTURE)
SUT SILK 3 0SH CR/8 30 (SUTURE) IMPLANT
SUT SILK 3-0 18XBRD TIE BLK (SUTURE) IMPLANT
SUT VIC AB 0 CT1 18XCR BRD8 (SUTURE) ×9 IMPLANT
SUT VIC AB 0 CT1 27 (SUTURE) ×4
SUT VIC AB 0 CT1 27XBRD ANBCTR (SUTURE) ×6 IMPLANT
SUT VIC AB 0 CT1 27XBRD ANTBC (SUTURE) IMPLANT
SUT VIC AB 0 CT1 8-18 (SUTURE) ×6
SUT VIC AB 2-0 CT1 18 (SUTURE) ×6 IMPLANT
SUT VIC AB 3-0 SH 8-18 (SUTURE) ×9 IMPLANT
SUT VIC AB 4-0 RB1 18 (SUTURE) IMPLANT
SUT VICRYL 4-0 PS2 18IN ABS (SUTURE) ×3 IMPLANT
TOWEL GREEN STERILE (TOWEL DISPOSABLE) ×9 IMPLANT
TOWEL GREEN STERILE FF (TOWEL DISPOSABLE) ×6 IMPLANT
TRAY FOLEY MTR SLVR 16FR STAT (SET/KITS/TRAYS/PACK) ×6 IMPLANT
WATER STERILE IRR 1000ML POUR (IV SOLUTION) ×6 IMPLANT

## 2022-10-29 NOTE — H&P (Signed)
History and Physical Interval Note:  10/29/2022 7:19 AM  Autumn Rose  has presented today for surgery, with the diagnosis of Lumbar pseudoarthrosis.  The various methods of treatment have been discussed with the patient and family. After consideration of risks, benefits and other options for treatment, the patient has consented to  Procedure(s): ALIF - L5-S1 with cage and revision posterior lateral fusion with replacement of pecicle screws (N/A) LAMINECTOMY WITH POSTERIOR LATERAL ARTHRODESIS LEVEL 1 (N/A) ABDOMINAL EXPOSURE (N/A) as a surgical intervention.  The patient's history has been reviewed, patient examined, no change in status, stable for surgery.  I have reviewed the patient's chart and labs.  Questions were answered to the patient's satisfaction.     Marty Heck     Patient name: Autumn Rose      MRN: [id number]        DOB: July 25, 1966          Sex: female   REASON FOR CONSULT: Anterior spine exposure for L5-S1 ALIF   HPI: Autumn Rose is a 57 y.o. female, with history of chronic back pain who presents for preop evaluation of anterior spine exposure for L5-S1 ALIF.  She states she has had pain most of her life with radiculopathy into both legs.  She previously underwent a L5-S1 posterior fusion with Dr. Louanne Skye on 09/22/2021 last year.  Her back pain did not get better after this.  Previous abdominal surgery includes tubal ligation, appendectomy and abdominoplasty/tummy tuck as recently as October 2023.  She says she spoke to her plastic surgeon who says it is okay to proceed with anterior spine surgery from his standpoint.       Past Medical History:  Diagnosis Date   Anxiety     Complication of anesthesia      Bronchospasm after having tubal ligation in 2011   PONV (postoperative nausea and vomiting)             Past Surgical History:  Procedure Laterality Date   ABLATION       APPENDECTOMY   2012   BACK SURGERY       BILATERAL CARPAL TUNNEL RELEASE  Bilateral 2021    November 30th & December 14th   BREAST IMPLANT REMOVAL       BREAST SURGERY       FOOT SURGERY       LIPOMA EXCISION Left 2005   MENISCUS REPAIR Right 2012   TUBAL LIGATION       WISDOM TOOTH EXTRACTION   1991           Family History  Problem Relation Age of Onset   Hypertension Mother     Hyperlipidemia Father     Cancer Father     Non-Hodgkin's lymphoma Father     Aneurysm Maternal Grandfather     Lung cancer Paternal Grandmother     Stroke Paternal Grandfather     Epilepsy Sister     Epilepsy Sister        SOCIAL HISTORY: Social History         Socioeconomic History   Marital status: Married      Spouse name: Not on file   Number of children: Not on file   Years of education: Not on file   Highest education level: Not on file  Occupational History   Not on file  Tobacco Use   Smoking status: Former      Types: Cigarettes      Quit date: 05/17/1986  Years since quitting: 36.4   Smokeless tobacco: Never  Vaping Use   Vaping Use: Never used  Substance and Sexual Activity   Alcohol use: Yes      Comment: occ   Drug use: No   Sexual activity: Not on file  Other Topics Concern   Not on file  Social History Narrative   Not on file    Social Determinants of Health    Financial Resource Strain: Not on file  Food Insecurity: Not on file  Transportation Needs: Not on file  Physical Activity: Not on file  Stress: Not on file  Social Connections: Not on file  Intimate Partner Violence: Not on file          Allergies  Allergen Reactions   Oxycodone Itching   Tape Rash            Current Outpatient Medications  Medication Sig Dispense Refill   Calcium Carbonate-Vitamin D (CALCIUM 600 + D PO) Take 1 tablet by mouth daily.         fish oil-omega-3 fatty acids 1000 MG capsule Take 1 g by mouth daily.         Multiple Vitamins-Minerals (MULTIVITAMIN WITH MINERALS) tablet Take 1 tablet by mouth daily.         pantoprazole (PROTONIX)  40 MG tablet Take 40 mg by mouth 2 (two) times daily with a meal.       traMADol (ULTRAM) 50 MG tablet Take 50 mg by mouth every 6 (six) hours as needed.       traMADol-acetaminophen (ULTRACET) 37.5-325 MG tablet Take 1 tablet by mouth every 6 (six) hours as needed. 30 tablet 0   cefadroxil (DURICEF) 500 MG capsule Take 500 mg by mouth daily.       diazepam (VALIUM) 5 MG tablet Take 5 mg by mouth.       HYDROcodone-acetaminophen (NORCO/VICODIN) 5-325 MG tablet Take 1 tablet by mouth every 6 (six) hours as needed.       meloxicam (MOBIC) 7.5 MG tablet Take 7.5 mg by mouth daily.       methocarbamol (ROBAXIN) 500 MG tablet TAKE 1 TABLET BY MOUTH EVERY 6 HOURS AS NEEDED FOR MUSCLE SPASM 30 tablet 0   nitrofurantoin (MACRODANTIN) 100 MG capsule Take 100 mg by mouth.       Omeprazole 20 MG TBDD 20 mg.       phentermine (ADIPEX-P) 37.5 MG tablet Take 37.5 mg by mouth daily.       promethazine (PHENERGAN) 25 MG tablet Take 25 mg by mouth.       sertraline (ZOLOFT) 100 MG tablet Take 50 mg by mouth daily.        No current facility-administered medications for this visit.      REVIEW OF SYSTEMS:  [X]  denotes positive finding, [ ]  denotes negative finding Cardiac   Comments:  Chest pain or chest pressure:      Shortness of breath upon exertion:      Short of breath when lying flat:      Irregular heart rhythm:             Vascular      Pain in calf, thigh, or hip brought on by ambulation:      Pain in feet at night that wakes you up from your sleep:       Blood clot in your veins:      Leg swelling:  Pulmonary      Oxygen at home:      Productive cough:       Wheezing:              Neurologic      Sudden weakness in arms or legs:       Sudden numbness in arms or legs:       Sudden onset of difficulty speaking or slurred speech:      Temporary loss of vision in one eye:       Problems with dizziness:              Gastrointestinal      Blood in stool:       Vomited  blood:              Genitourinary      Burning when urinating:       Blood in urine:             Psychiatric      Major depression:              Hematologic      Bleeding problems:      Problems with blood clotting too easily:             Skin      Rashes or ulcers:             Constitutional      Fever or chills:          PHYSICAL EXAM:    Vitals:    10/12/22 0927  BP: 126/81  Pulse: 77  Resp: 14  Temp: (!) 97.2 F (36.2 C)  TempSrc: Temporal  SpO2: 98%  Weight: 167 lb (75.8 kg)  Height: 5' 5.5" (1.664 m)      GENERAL: The patient is a well-nourished female, in no acute distress. The vital signs are documented above. CARDIAC: There is a regular rate and rhythm.  VASCULAR:  Bilateral femoral pulses palpable Bilateral DP pulses palpable Previous scar from abdominoplasty well-healed PULMONARY: No respiratory distress. ABDOMEN: Soft and non-tender. MUSCULOSKELETAL: There are no major deformities or cyanosis. NEUROLOGIC: No focal weakness or paresthesias are detected. PSYCHIATRIC: The patient has a normal affect.   DATA:  CT lumbar spine reviewed from 07/21/22    Assessment/Plan:   57 year old female with chronic lower back pain that presents for preop evaluation of anterior spine exposure for L5-S1 ALIF.  I reviewed her most recent CT scan and discussed a transverse incision over the left rectus muscle at the level of the disc space and then entering into the retroperitoneum by mobilizing peritoneum and left ureter across midline.  Discussed mobilizing iliac artery and vein to get the disc space exposed from the front.  Discussed risk of injury to the above structures.  All questions answered.  Look forward to assisting Dr. Annette Stable.  She has had a previous abdominoplasty last year and the incision is well-healed and she states she has spoken with her plastic surgeon who states it is okay to proceed with anterior approach.     Marty Heck, MD [facility] Office: [phone]

## 2022-10-29 NOTE — Anesthesia Procedure Notes (Addendum)
Procedure Name: Intubation Date/Time: 10/29/2022 7:58 AM  Performed by: Barrington Ellison, CRNAPre-anesthesia Checklist: Patient identified, Emergency Drugs available, Suction available and Patient being monitored Patient Re-evaluated:Patient Re-evaluated prior to induction Oxygen Delivery Method: Circle System Utilized Preoxygenation: Pre-oxygenation with 100% oxygen Induction Type: IV induction and Cricoid Pressure applied Ventilation: Mask ventilation without difficulty Laryngoscope Size: Mac and 3 Grade View: Grade II Tube type: Oral Tube size: 7.0 mm Number of attempts: 1 Airway Equipment and Method: Stylet and Oral airway Placement Confirmation: ETT inserted through vocal cords under direct vision, positive ETCO2 and breath sounds checked- equal and bilateral Secured at: 22 cm Tube secured with: Tape Dental Injury: Teeth and Oropharynx as per pre-operative assessment  Comments: Cricoid pressure held to obtain view Intubated by Elson Areas SRNA

## 2022-10-29 NOTE — Op Note (Signed)
Date: October 29, 2022  Preoperative diagnosis: Chronic lower back pain  Postoperative diagnosis: Same  Procedure: Anterior spine exposure via anterior retroperitoneal approach for L5-S1 ALIF  Surgeon: Dr. Marty Heck, MD  Co-surgeon: Dr. Earnie Larsson, MD  Indications: 57 year old female with chronic lower back pain that has had a prior L5-S1 posterior fusion that has failed.  She now presents for L5-S1 revision and vascular surgery has been asked to assist with anterior spine exposure for L5-S1 ALIF.  She presents after risks benefits discussed.  Findings: The L5-S1 disc space was marked over the left rectus muscle with a fluoroscopic C-arm in the lateral position and used her previous incision for from her recent abdominoplasty.  The anterior rectus sheath was opened transversely and we mobilized flaps underneath the anterior rectus sheath.  The left rectus muscle was mobilized to the midline and entered into the retroperitoneum and mobilized peritoneum and left ureter across midline.  Middle sacral vessels were divided between clips and ties.  Mobilized on both sides of the disc space and a fixed NuVasive retractor was placed.  Anesthesia: General  Details: Patient was taken to the operating room after informed consent was obtained.  Placed on the operative table in supine position.  General endotracheal anesthesia was induced.  Fluoroscopic C-arm was used in lateral position to mark the L5-S1 disc space over the left rectus muscle.  We did plan using her previous abdominoplasty incision for this exposure.  The abdominal wall was then prepped and draped in standard sterile fashion.  Antibiotics were given.  Timeout performed.  We made a transverse incision over our preoperative mark.  Dissected down with Bovie cautery and used cerebellar retractors in the subcutaneous tissue for added visualization.  The anterior rectus sheath was encountered and this was opened transversely and we raised  flaps underneath the anterior rectus sheath with hemostats.  The left rectus muscle was mobilized to the midline and we entered into the retroperitoneum and mobilized peritoneum and left ureter across midline with blunt dissection.  The middle sacral vessels were divided between clips and ties.  Dr. Annette Stable used hand-held Wiley retractors to pull the peritoneum and left ureter to the midline.  I then bluntly mobilized at the L5-S1 disc space including mobilizing the left iliac vein lateral.  Once we had good working room here I placed a fixed NuVasive retractor on the field.  160 reverse lips were placed on each side of the disc space with a 120 reverse lip cranial and a 140 reverse lip caudal.  We had good anterior exposure here.  The case was turned over to Dr. Annette Stable.  Complication: None  Condition: Stable  Marty Heck, MD [facility] Office: [place]

## 2022-10-29 NOTE — Anesthesia Postprocedure Evaluation (Signed)
Anesthesia Post Note  Patient: SABAH FUSCO  Procedure(s) Performed: Anterior Lumbar Interbody Fusion - Lumbar Five-Sacral One with cage and revision posterior lateral fusion with replacement of pedicle screws LAMINECTOMY WITH POSTERIOR LATERAL ARTHRODESIS LEVEL ONE (Back) ABDOMINAL EXPOSURE     Patient location during evaluation: PACU Anesthesia Type: General Level of consciousness: awake and alert, oriented and patient cooperative Pain management: pain level controlled Vital Signs Assessment: post-procedure vital signs reviewed and stable Respiratory status: spontaneous breathing, nonlabored ventilation and respiratory function stable Cardiovascular status: blood pressure returned to baseline and stable Postop Assessment: no apparent nausea or vomiting Anesthetic complications: no   No notable events documented.  Last Vitals:  Vitals:   10/29/22 1330 10/29/22 1347  BP: (!) 141/91 (!) 142/92  Pulse: 93 98  Resp: 13 20  Temp: 36.9 C 36.7 C  SpO2: 96% 99%    Last Pain:  Vitals:   10/29/22 1347  TempSrc: Oral  PainSc:                  Pervis Hocking

## 2022-10-29 NOTE — Transfer of Care (Signed)
Immediate Anesthesia Transfer of Care Note  Patient: Autumn Rose  Procedure(s) Performed: Anterior Lumbar Interbody Fusion - Lumbar Five-Sacral One with cage and revision posterior lateral fusion with replacement of pedicle screws LAMINECTOMY WITH POSTERIOR LATERAL ARTHRODESIS LEVEL ONE (Back) ABDOMINAL EXPOSURE  Patient Location: PACU  Anesthesia Type:General  Level of Consciousness: awake and oriented  Airway & Oxygen Therapy: Patient Spontanous Breathing  Post-op Assessment: Report given to RN  Post vital signs: Reviewed and stable  Last Vitals:  Vitals Value Taken Time  BP 108/75 10/29/22 1152  Temp    Pulse 88 10/29/22 1155  Resp 17 10/29/22 1155  SpO2 100 % 10/29/22 1155  Vitals shown include unvalidated device data.  Last Pain:  Vitals:   10/29/22 0600  TempSrc:   PainSc: 5          Complications: No notable events documented.

## 2022-10-29 NOTE — Anesthesia Preprocedure Evaluation (Signed)
Anesthesia Evaluation  Patient identified by MRN, date of birth, ID band Patient awake    Reviewed: Allergy & Precautions, NPO status , Patient's Chart, lab work & pertinent test results  History of Anesthesia Complications (+) PONV and history of anesthetic complications  Airway Mallampati: II  TM Distance: >3 FB Neck ROM: Full    Dental no notable dental hx. (+) Teeth Intact, Dental Advisory Given   Pulmonary former smoker   Pulmonary exam normal breath sounds clear to auscultation       Cardiovascular hypertension (138/94 preop, no BP meds), Normal cardiovascular exam Rhythm:Regular Rate:Normal     Neuro/Psych  Headaches PSYCHIATRIC DISORDERS Anxiety        GI/Hepatic Neg liver ROS,GERD  Medicated and Controlled,,  Endo/Other  negative endocrine ROS    Renal/GU negative Renal ROS  negative genitourinary   Musculoskeletal  (+) Arthritis , Osteoarthritis,    Abdominal   Peds negative pediatric ROS (+)  Hematology negative hematology ROS (+)   Anesthesia Other Findings   Reproductive/Obstetrics (+) Pregnancy                              Anesthesia Physical Anesthesia Plan  ASA: 2  Anesthesia Plan: General   Post-op Pain Management: Tylenol PO (pre-op)*, Ketamine IV*, Dilaudid IV and Toradol IV (intra-op)*   Induction: Intravenous  PONV Risk Score and Plan: 4 or greater and Ondansetron, Dexamethasone, Midazolam and Treatment may vary due to age or medical condition  Airway Management Planned: Oral ETT  Additional Equipment: None  Intra-op Plan:   Post-operative Plan: Extubation in OR  Informed Consent: I have reviewed the patients History and Physical, chart, labs and discussed the procedure including the risks, benefits and alternatives for the proposed anesthesia with the patient or authorized representative who has indicated his/her understanding and acceptance.      Dental advisory given  Plan Discussed with: CRNA  Anesthesia Plan Comments:          Anesthesia Quick Evaluation

## 2022-10-29 NOTE — H&P (Signed)
Autumn Rose is an 57 y.o. female.   Chief Complaint: Back pain HPI: 57 year old female status post for posterior lumbar decompression and fusion for lytic spondylolisthesis by another physician.  Patient has not ongoing back pain which is failed conservative management.  Workup demonstrates evidence of pseudoarthrosis at L5-S1 with loosening of her pedicle screw instrumentation and failure of her anterior cage to fully solidify.  Patient presents now for revision lumbar fusion starting first with anterior lumbar interbody fusion hopefully using interbody cage and anterior plating then followed by revision posterior lumbar fusion utilizing pedicle screw fixation, local autografting and bone morphogenic protein.  Patient has severe back pain with some radiation to her lower extremity although back pain is her main complaint.  She has no numbness weakness or paresthesias.  She is having no bowel or bladder dysfunction.  Past Medical History:  Diagnosis Date   Anxiety    Complication of anesthesia    Bronchospasm after having tubal ligation in 2011   Lumbar pseudoarthrosis    PONV (postoperative nausea and vomiting)     Past Surgical History:  Procedure Laterality Date   abdominal plasty  05/2022   tummy tuck   ABLATION     APPENDECTOMY  2012   BACK SURGERY     BILATERAL CARPAL TUNNEL RELEASE Bilateral 2021   November 30th & December 14th   BREAST IMPLANT REMOVAL     BREAST SURGERY     FOOT SURGERY Left    plantar fascitis   LIPOMA EXCISION Left 2005   MENISCUS REPAIR Right 2012   TUBAL LIGATION     WISDOM TOOTH EXTRACTION  1991    Family History  Problem Relation Age of Onset   Hypertension Mother    Hyperlipidemia Father    Cancer Father    Non-Hodgkin's lymphoma Father    Aneurysm Maternal Grandfather    Lung cancer Paternal Grandmother    Stroke Paternal Grandfather    Epilepsy Sister    Epilepsy Sister    Social History:  reports that she quit smoking about 36  years ago. Her smoking use included cigarettes. She has never used smokeless tobacco. She reports current alcohol use. She reports that she does not use drugs.  Allergies:  Allergies  Allergen Reactions   Oxycodone Itching   Tape Rash    Medications Prior to Admission  Medication Sig Dispense Refill   acetaminophen (TYLENOL) 500 MG tablet Take 500 mg by mouth every 6 (six) hours as needed for moderate pain.     Calcium Carbonate-Vitamin D (CALCIUM 600 + D PO) Take 1 tablet by mouth daily.       cetirizine (ZYRTEC) 10 MG tablet Take 10 mg by mouth daily as needed for allergies.     fish oil-omega-3 fatty acids 1000 MG capsule Take 1 g by mouth daily.       fluticasone (FLONASE) 50 MCG/ACT nasal spray Place 1 spray into both nostrils daily as needed for allergies or rhinitis.     Multiple Vitamins-Minerals (MULTIVITAMIN WITH MINERALS) tablet Take 1 tablet by mouth daily.       pantoprazole (PROTONIX) 40 MG tablet Take 40 mg by mouth 2 (two) times daily with a meal.     sertraline (ZOLOFT) 100 MG tablet Take 50 mg by mouth daily.      No results found for this or any previous visit (from the past 48 hour(s)). No results found.  Pertinent items noted in HPI and remainder of comprehensive ROS otherwise negative.  Blood pressure (!) 138/94, pulse 75, temperature 98 F (36.7 C), temperature source Oral, resp. rate 17, height 5\' 5"  (1.651 m), weight 75.3 kg, SpO2 99 %.  Awake and alert.  Oriented and appropriate.  Cranial nerve function normal bilaterally.  Motor and sensory function intact.  Reflexes hypoactive but symmetric.  Gait antalgic.  Patient head ears eyes nose and throat is unremarked.  Chest and abdomen are benign.  Extremities are free of major deformity. Assessment/Plan L5-S1 pseudoarthrosis with hardware failure.  Plan L5-S1 revision anterior lumbar interbody fusion followed by posterior revision lumbar fusion with replacement of instrumentation.  Risks and benefits been  explained.  Patient wishes to proceed.  Cooper Render Derrica Sieg 10/29/2022, 7:41 AM

## 2022-10-29 NOTE — Op Note (Signed)
Date of procedure: 10/29/2022  Date of dictation: Same  Service: Neurosurgery  Preoperative diagnosis: L5-S1 pseudoarthrosis  Postoperative diagnosis: Same  Procedure Name: Anterior lumbar interbody decompression and fusion utilizing interbody cage, morselized allograft, bone morphogenic protein, and anterior fixation  Reexploration of L5-S1 posterior lateral fusion with removal of loosened hardware.    L5-S1 revision posterior lateral fusion utilizing nonsegmental pedicle screw fixation, local autograft, morselized allograft, bone morphogenic protein  Surgeon:Prudy Candy A.Felesia Stahlecker, M.D.  Asst. Surgeon: Reinaldo Meeker, NP  Vascular Surgeon Carlis Abbott, MD  Anesthesia: General  Indication: Patient is 57 year old female who is status post L5-S1 decompression and fusion by another physician who presents with worsening back pain which is failed all efforts conservative management.  Workup demonstrates evidence of nonunion at L5-S1 with loosening of her L5 and S1 pedicle screws.  Patient has failed conservative management.  She presents now for anterior posterior fusion revision.  Operative note: After induction of anesthesia, patient positioned supine with her arms outstretched.  Patient's lower abdominal region was prepped and draped sterilely.  Incision made in her left lower abdomen.  Dr. Carlis Abbott of the vascular surgery service then dissected down to the anterior rectus sheath.  Anterior rectus sheath was divided.  Rectus muscles were mobilized.  A dissection with plane was then made in the retroperitoneal area.  Ureter was protected.  Iliac arteries and iliac veins were dissected free and identified.  Exposure of the L5-S1 anterior interspace was performed.  Middle sacral vessels were clipped and divided.  NuVasive self-retaining retractor system was placed.  Fluoroscopy was used and the level was confirmed.  I then incised the anterior aspect of the annulus of L5-S1.  This exposed the underlying cage paracentrally  on the right at L5-S1.  The disc space was entered.  The disc space was obviously not fused and mobile.  I removed disc down to level the posterior annulus.  I worked around the cage helping to free it and eventually the cage was dissected free and removed without breakage.  The endplates were then further cleaned of soft tissue using Kerrison rongeurs and curettes.  Posterior annulus was further resected as was the posterior logical ligament for a posterior release.  The segment was mobilized.  A medium globus 15 mm x 15 degree trial was placed with good purchase and good appearance on intraoperative fluoroscopy.  A 15 mm x 15 degree medium cage was then packed with Trinity allograft product and bone morphogenic soaked sponge.  This was then impacted in the place and recessed slightly from the anterior cortical margin of L5 and the sacrum.  Anterior fixation was then made using locking stays which were placed into the body of L5 and S1 with 2 stays going superiorly and L5 and 1 stay going inferiorly and S1.  Final images reveal good position of the cage and the hardware at the proper operative level with normal alignment of the spine.  Wound was irrigated.  It was then closed with a running PDS at the anterior rectus sheath and then was closed in layers with Vicryl sutures.  Steri-Strips and sterile dressing were applied.  The patient was then repositioned prone onto the operating bed and placed on a Wilson frame where she was appropriately padded.  Patient's lumbar region was prepped and draped sterilely.  Incision made through her prior lumbar incision.  Dissection performed exposing the lamina of L5 as well as the pedicle screws potation at L5 and the sacrum.  The pedicle screws potation was obviously loose at  both levels.  The construct was disassembled.  The screws were removed.  The screw tracks into S1 were not terribly stripped and were minimally loosened around the screw.  I was able to place 8 mm x 40 mm  globus screws into the old tracks with good purchase.  The pedicle screw into L5 were removed.  As these were cortical screws that were not optimally positioned in my opinion I tried to revise these.  On the right side at L5 I was able to put in a traditional pedicle screw through a Wiltsie approach.  I placed a 6.5 x 40 mm screw on the right which was confirmed to be in good position by AP and lateral fluoroscopy.  I decided to salvage the left-sided cortical pedicle screw track and I placed an 8.5 mm x 40 mm screw on the left.  Good purchase was obtained with both screws.  X-rays reveal good position of the cages and the hardware at the proper operative level with normal alignment of spine.  Wound was then irrigated.  Transverse processes and sacral ala were decorticated.  Morselized autograft, Trinity allograft and bone morphogenic protein sponges were packed posterolaterally along the ala and transverse process of L5.  Short segment of titanium rod placed over the screw head L5 and S1.  Locking caps placed through the screws.  Locking caps then engaged.  Gelfoam placed over the thecal sac.  Vancomycin powder was placed in the deep wound space.  Wound is then closed in layers with Vicryl sutures.  Steri-Strips and sterile dressing were applied.  No apparent complications.  Patient tolerated the procedure well and she returns to the recovery room postop. 10/29/2022

## 2022-10-29 NOTE — Brief Op Note (Signed)
10/29/2022  11:38 AM  PATIENT:  Sitara Cletis Media  57 y.o. female  PRE-OPERATIVE DIAGNOSIS:  Lumbar pseudoarthrosis  POST-OPERATIVE DIAGNOSIS:  Lumbar pseudoarthrosis  PROCEDURE:  Procedure(s): Anterior Lumbar Interbody Fusion - Lumbar Five-Sacral One with cage and revision posterior lateral fusion with replacement of pedicle screws (N/A) LAMINECTOMY WITH POSTERIOR LATERAL ARTHRODESIS LEVEL ONE (N/A) ABDOMINAL EXPOSURE (N/A)  SURGEON:  Surgeon(s) and Role: Panel 1:    Earnie Larsson, MD - Primary Panel 2:    Marty Heck, MD - Primary  PHYSICIAN ASSISTANT:   ASSISTANTSMearl Latin   ANESTHESIA:   general  EBL:  350 mL   BLOOD ADMINISTERED:none  DRAINS: none   LOCAL MEDICATIONS USED:  MARCAINE     SPECIMEN:  No Specimen  DISPOSITION OF SPECIMEN:  N/A  COUNTS:  YES  TOURNIQUET:  * No tourniquets in log *  DICTATION: .Dragon Dictation  PLAN OF CARE: Admit to inpatient   PATIENT DISPOSITION:  PACU - hemodynamically stable.   Delay start of Pharmacological VTE agent (>24hrs) due to surgical blood loss or risk of bleeding: yes

## 2022-10-29 NOTE — TOC Transition Note (Signed)
Transition of Care [facility]) - CM/SW Discharge Note   Patient Details  Name: Autumn Rose MRN: [id number] Date of Birth: March 23, 1966  [facility] CM/SW Contact:  Levonne Lapping, RN Phone Number: 10/29/2022, 2:14 PM   Clinical Narrative:     Patient will return to home with [facility] and [facility] have been ordered and will be delivered by [employer] .    Primary is established and patient has [employer] . No additional TOC needs         Patient Goals and CMS Choice      Discharge Placement                         Discharge Plan and Services Additional resources added to the After Visit Summary for                                       Social Determinants of Health (SDOH) Interventions SDOH Screenings   Tobacco Use: Medium Risk (10/29/2022)     Readmission Risk Interventions     No data to display

## 2022-10-29 NOTE — Progress Notes (Signed)
Orthopedic Tech Progress Note Patient Details:  Autumn Rose 19-Mar-1966 [id number]  Patient ID: [name], female   DOB: [birth year], 57 y.o.   MRN: [id number] Patient stated they already have an LSO. Vernona Rieger 10/29/2022, 4:29 PM

## 2022-10-30 MED ORDER — TAMSULOSIN HCL 0.4 MG PO CAPS
0.4000 mg | ORAL_CAPSULE | Freq: Every day | ORAL | Status: DC
  Administered 2022-10-30: 0.4 mg via ORAL
  Filled 2022-10-30: qty 1

## 2022-10-30 MED ORDER — METHOCARBAMOL 750 MG PO TABS: 750.0000 mg | ORAL_TABLET | Freq: Three times a day (TID) | ORAL | 2 refills | Status: DC | PRN

## 2022-10-30 MED ORDER — HYDROCODONE-ACETAMINOPHEN 5-325 MG PO TABS: 1.0000 | ORAL_TABLET | Freq: Four times a day (QID) | ORAL | 0 refills | Status: AC | PRN

## 2022-10-30 MED ORDER — ONDANSETRON HCL 4 MG PO TABS: 4.0000 mg | ORAL_TABLET | Freq: Three times a day (TID) | ORAL | 1 refills | Status: DC | PRN

## 2022-10-30 NOTE — Plan of Care (Signed)
Patient evaluated by PT/OT and seen by both neurosurgery and vascular team. Patient is cleared for discharge and is awaiting her ride and orders.   Problem: Education: Goal: Knowledge of General Education information will improve Description: Including pain rating scale, medication(s)/side effects and non-pharmacologic comfort measures Outcome: Adequate for Discharge   Problem: Health Behavior/Discharge Planning: Goal: Ability to manage health-related needs will improve Outcome: Adequate for Discharge   Problem: Clinical Measurements: Goal: Ability to maintain clinical measurements within normal limits will improve Outcome: Adequate for Discharge Goal: Will remain free from infection Outcome: Adequate for Discharge Goal: Diagnostic test results will improve Outcome: Adequate for Discharge Goal: Respiratory complications will improve Outcome: Adequate for Discharge Goal: Cardiovascular complication will be avoided Outcome: Adequate for Discharge   Problem: Activity: Goal: Risk for activity intolerance will decrease Outcome: Adequate for Discharge   Problem: Nutrition: Goal: Adequate nutrition will be maintained Outcome: Adequate for Discharge   Problem: Coping: Goal: Level of anxiety will decrease Outcome: Adequate for Discharge   Problem: Elimination: Goal: Will not experience complications related to bowel motility Outcome: Adequate for Discharge Goal: Will not experience complications related to urinary retention Outcome: Adequate for Discharge   Problem: Pain Managment: Goal: General experience of comfort will improve Outcome: Adequate for Discharge   Problem: Safety: Goal: Ability to remain free from injury will improve Outcome: Adequate for Discharge   Problem: Skin Integrity: Goal: Risk for impaired skin integrity will decrease Outcome: Adequate for Discharge   Problem: Education: Goal: Ability to verbalize activity precautions or restrictions will  improve Outcome: Adequate for Discharge Goal: Knowledge of the prescribed therapeutic regimen will improve Outcome: Adequate for Discharge Goal: Understanding of discharge needs will improve Outcome: Adequate for Discharge   Problem: Activity: Goal: Ability to avoid complications of mobility impairment will improve Outcome: Adequate for Discharge Goal: Ability to tolerate increased activity will improve Outcome: Adequate for Discharge Goal: Will remain free from falls Outcome: Adequate for Discharge   Problem: Bowel/Gastric: Goal: Gastrointestinal status for postoperative course will improve Outcome: Adequate for Discharge   Problem: Clinical Measurements: Goal: Ability to maintain clinical measurements within normal limits will improve Outcome: Adequate for Discharge Goal: Postoperative complications will be avoided or minimized Outcome: Adequate for Discharge Goal: Diagnostic test results will improve Outcome: Adequate for Discharge   Problem: Pain Management: Goal: Pain level will decrease Outcome: Adequate for Discharge   Problem: Skin Integrity: Goal: Will show signs of wound healing Outcome: Adequate for Discharge   Problem: Health Behavior/Discharge Planning: Goal: Identification of resources available to assist in meeting health care needs will improve Outcome: Adequate for Discharge   Problem: Bladder/Genitourinary: Goal: Urinary functional status for postoperative course will improve Outcome: Adequate for Discharge

## 2022-10-30 NOTE — Evaluation (Signed)
Physical Therapy Evaluation and Discharge Patient Details Name: Autumn Rose MRN: [id number] DOB: 04/04/1966 Today's Date: 10/30/2022  History of Present Illness  Pt is a 57 y/o female who presents s/p L5-S1 ALIF on 10/29/2022. PMH significant for B carpal tunnel release 2021, R meniscus repair 2012, prior back surgery, lumbar pseudoarthrosis.   Clinical Impression  Patient evaluated by Physical Therapy with no further acute PT needs identified. All education has been completed and the patient has no further questions. Pt was able to demonstrate transfers and ambulation with gross modified independence to supervision for safety and no AD. Pt was educated on precautions, brace application/wearing schedule, appropriate activity progression, and car transfer. See below for any follow-up Physical Therapy or equipment needs. PT is signing off. Thank you for this referral.        Recommendations for follow up therapy are one component of a multi-disciplinary discharge planning process, led by the attending physician.  Recommendations may be updated based on patient status, additional functional criteria and insurance authorization.  Follow Up Recommendations No PT follow up      Assistance Recommended at Discharge PRN  Patient can return home with the following  Assistance with cooking/housework;Assist for transportation;Help with stairs or ramp for entrance    Equipment Recommendations None recommended by PT  Recommendations for Other Services       Functional Status Assessment Patient has had a recent decline in their functional status and demonstrates the ability to make significant improvements in function in a reasonable and predictable amount of time.     Precautions / Restrictions Precautions Precautions: Back Precaution Booklet Issued: Yes (comment) Precaution Comments: able to recall 3/3 precautions Required Braces or Orthoses: Spinal Brace Spinal Brace: Lumbar corset;Applied  in sitting position Restrictions Weight Bearing Restrictions: No      Mobility  Bed Mobility Overal bed mobility: Modified Independent             General bed mobility comments: Increased time. HOB elevated as pt has an adjustable bed at home.    Transfers Overall transfer level: Modified independent Equipment used: None               General transfer comment: GUarded but no assist required to power up to full stand.    Ambulation/Gait Ambulation/Gait assistance: Supervision Gait Distance (Feet): 300 Feet Assistive device: None Gait Pattern/deviations: Step-through pattern, Decreased stride length Gait velocity: Decreased Gait velocity interpretation: 1.31 - 2.62 ft/sec, indicative of limited community ambulator   General Gait Details: SLow and guarded in hall with frequent railing use for support. No overt LOB ntoed.  Stairs            Wheelchair Mobility    Modified Rankin (Stroke Patients Only)       Balance Overall balance assessment: Mild deficits observed, not formally tested                                           Pertinent Vitals/Pain Pain Assessment Pain Assessment: Faces Faces Pain Scale: Hurts little more Pain Location: incision sites Pain Descriptors / Indicators: Operative site guarding, Sore Pain Intervention(s): Monitored during session, Limited activity within patient's tolerance, Repositioned    Home Living Family/patient expects to be discharged to:: Private residence Living Arrangements: Spouse/significant other Available Help at Discharge: Family;Available 24 hours/day Type of Home: House Home Access: Stairs to enter Entrance Stairs-Rails: None Entrance [employer]  of Steps: 1   Home Layout: One level Home Equipment: Conservation officer, nature (2 wheels);Shower seat;Shower seat - built in      Prior Function Prior Level of Function : Independent/Modified Independent               ADLs Comments:  independent with selfcare tasks and ADLs     Hand Dominance   Dominant Hand: Right    Extremity/Trunk Assessment   Upper Extremity Assessment Upper Extremity Assessment: Overall WFL for tasks assessed (not formally assessed secondary to back precautions but able to use functionally for dressing tasks without difficulty)    Lower Extremity Assessment Lower Extremity Assessment: Defer to PT evaluation    Cervical / Trunk Assessment Cervical / Trunk Assessment: Back Surgery (LSO in sitting)  Communication   Communication: No difficulties  Cognition Arousal/Alertness: Awake/alert Behavior During Therapy: WFL for tasks assessed/performed Overall Cognitive Status: Within Functional Limits for tasks assessed                                          General Comments      Exercises     Assessment/Plan    PT Assessment Patient does not need any further PT services  PT Problem List         PT Treatment Interventions      PT Goals (Current goals can be found in the Care Plan section)  Acute Rehab PT Goals Patient Stated Goal: Home today PT Goal Formulation: All assessment and education complete, DC therapy    Frequency       Co-evaluation               AM-PAC PT "6 Clicks" Mobility  Outcome Measure Help needed turning from your back to your side while in a flat bed without using bedrails?: None Help needed moving from lying on your back to sitting on the side of a flat bed without using bedrails?: None Help needed moving to and from a bed to a chair (including a wheelchair)?: None Help needed standing up from a chair using your arms (e.g., wheelchair or bedside chair)?: None Help needed to walk in hospital room?: A Little Help needed climbing 3-5 steps with a railing? : A Little 6 Click Score: 22    End of Session Equipment Utilized During Treatment: Back brace Activity Tolerance: Patient tolerated treatment well Patient left: in bed;with  call bell/phone within reach Nurse Communication: Mobility status PT Visit Diagnosis: Pain;Unsteadiness on feet (R26.81) Pain - part of body:  (incision sites on back and abdomen)    Time: [id number] PT Time Calculation (min) (ACUTE ONLY): 18 min   Charges:   PT Evaluation $PT Eval Low Complexity: [place], PT, DPT Acute Rehabilitation Services Secure Chat Preferred Office: [phone]   Thelma Comp 10/30/2022, 8:54 AM

## 2022-10-30 NOTE — Progress Notes (Addendum)
  Progress Note    10/30/2022 8:56 AM 1 Day Post-Op  Subjective:  sore on back and LLQ   Vitals:   10/30/22 0415 10/30/22 0710  BP: 106/70 116/76  Pulse: 97 96  Resp: 18 20  Temp: 98.8 F (37.1 C) 98.8 F (37.1 C)  SpO2: 98% 100%   Physical Exam: Cardiac:  regular Lungs:  non labored Incisions:  LLQ incision is intact and well appearing, honey comb dressing c/d/i Extremities:  well perfused and warm with palpable DP pulses bilaterally Abdomen:  distended but soft, tenderness as expected Neurologic: alert and oriented  CBC    Component Value Date/Time   WBC 5.5 10/22/2022 1034   RBC 5.08 10/22/2022 1034   HGB 13.6 10/22/2022 1034   HCT 40.3 10/22/2022 1034   PLT 316 10/22/2022 1034   MCV 79.3 (L) 10/22/2022 1034   MCH 26.8 10/22/2022 1034   MCHC 33.7 10/22/2022 1034   RDW 15.7 (H) 10/22/2022 1034   LYMPHSABS 1.5 06/09/2022 0822   MONOABS 0.4 06/09/2022 0822   EOSABS 0.3 06/09/2022 0822   BASOSABS 0.0 06/09/2022 0822    BMET    Component Value Date/Time   NA 138 10/22/2022 1034   K 4.0 10/22/2022 1034   CL 103 10/22/2022 1034   CO2 27 10/22/2022 1034   GLUCOSE 76 10/22/2022 1034   BUN 10 10/22/2022 1034   CREATININE 0.76 10/22/2022 1034   CALCIUM 9.9 10/22/2022 1034   GFRNONAA >60 10/22/2022 1034   GFRAA >60 12/18/2019 1154    INR No results found for: "INR"   Intake/Output Summary (Last 24 hours) at 10/30/2022 0856 Last data filed at 10/30/2022 [id number] Gross per 24 hour  Intake 3210 ml  Output 610 ml  Net 2600 ml     Assessment/Plan:  57 y.o. female is s/p L5-S1 ALIF 1 Day Post-Op   Doing well post op Cleared by PT/ OT Mobilizing okay Voiding without difficulty. Has not had BM yet but she says this is not out of ordinary for her Left lower quadrant incision is intact with honey comb dressings BLE well perfused and warm with easily palpable pulses bilaterally She is doing well from vascular standpoint Stable for discharge from our standpoint  and she can follow up with [place] as needed   Karoline Caldwell, PA-C [facility] [phone] 10/30/2022 8:56 AM  VASCULAR STAFF ADDENDUM: I have independently interviewed and examined the patient. I agree with the above.    Cassandria Santee, MD [facility] Phone Number: [phone] 10/30/2022 10:08 AM

## 2022-10-30 NOTE — Discharge Summary (Signed)
  Discharge Summary   Date of Admission: 10/29/22   Date of Discharge: 10/30/22   Attending Physician:  Earnie Larsson, MD  Hospital Course: Patient was admitted following an uncomplicated [id number] ALIF/revision PSF. They were recovered in PACU and transferred to [facility]. Their preop symptoms were improved. Pt to f/u in office in 2 weeks for post op visit. Pt is in agreement w/ plan.    Neurologic exam at discharge:  A&O x3 Strength 5/5 x4.  SILTx4.  Incisions c/d/I.  Pt ambulating without difficulty.      Discharge diagnosis: L5-S1 pseudoarthrosis    Caroline More, [facility]

## 2022-10-30 NOTE — Discharge Instructions (Signed)
Wound Care Keep incision covered and dry for three days.  If you shower, cover incision with plastic wrap.  Do not put any creams, lotions, or ointments on incision. Leave steri-strips on back.  They will fall off by themselves. Activity Walk each and every day, increasing distance each day. No lifting greater than 8 lbs.  Avoid excessive neck motion. No driving for 2 weeks; may ride as a passenger locally. If provided with back brace, wear when out of bed.  It is not necessary to wear brace in bed. Diet Resume your normal diet.  Return to Work Will be discussed at you follow up appointment. Call Your Doctor If Any of These Occur Redness, drainage, or swelling at the wound.  Temperature greater than 101 degrees. Severe pain not relieved by pain medication. Incision starts to come apart. Follow Up Appt Call [phone])  for problems.  If you have any hardware placed in your spine, you will need an x-ray before your appointment.

## 2022-10-30 NOTE — Evaluation (Signed)
Occupational Therapy Evaluation Patient Details Name: Autumn Rose MRN: [id number] DOB: 1966/07/03 Today's Date: 10/30/2022   History of Present Illness Pt is a 57 y/o female who presents s/p L5-S1 ALIF on 10/29/2022. PMH significant for B carpal tunnel release 2021, R meniscus repair 2012, prior back surgery, lumbar pseudoarthrosis.   Clinical Impression   Pt currently modified independent for toilet transfers with setup for dressing tasks sit to stand.  Pain rated at 5/10 in lower back but tolerable.  Educated on back precautions for bed mobility and selfcare tasks.  She already has a shower seat for home and no need for a 3:1.  She will have PRN assist from her spouse as well.  No further acute or post acute OT needs at this time.       Recommendations for follow up therapy are one component of a multi-disciplinary discharge planning process, led by the attending physician.  Recommendations may be updated based on patient status, additional functional criteria and insurance authorization.   Follow Up Recommendations  No OT follow up     Assistance Recommended at Discharge PRN  Patient can return home with the following Assistance with cooking/housework;A little help with bathing/dressing/bathroom;Assist for transportation    Functional Status Assessment  Patient has had a recent decline in their functional status and demonstrates the ability to make significant improvements in function in a reasonable and predictable amount of time.  Equipment Recommendations  None recommended by OT       Precautions / Restrictions Precautions Precautions: Back Precaution Booklet Issued: Yes (comment) Precaution Comments: able to recall 3/3 precautions Required Braces or Orthoses: Spinal Brace Spinal Brace: Lumbar corset;Applied in sitting position Restrictions Weight Bearing Restrictions: No      Mobility Bed Mobility Overal bed mobility: Modified Independent                   Transfers Overall transfer level: Modified independent Equipment used: None               General transfer comment: Pt completed sit to stand with use of hands on bed and walks slower than baseline secondary to some pain, but no LOB with ambulation to and from the bathroom.      Balance Overall balance assessment: Mild deficits observed, not formally tested Sitting-balance support: No upper extremity supported Sitting balance-Leahy Scale: Good     Standing balance support: No upper extremity supported, During functional activity Standing balance-Leahy Scale: Fair                             ADL either performed or assessed with clinical judgement   ADL Overall ADL's : Needs assistance/impaired Eating/Feeding: Independent;Sitting   Grooming: Wash/dry hands;Wash/dry face;Modified independent   Upper Body Bathing: Set up;Sitting Upper Body Bathing Details (indicate cue type and reason): simulated Lower Body Bathing: Supervison/ safety;Sit to/from stand   Upper Body Dressing : Set up;Sitting Upper Body Dressing Details (indicate cue type and reason): including LSO Lower Body Dressing: Set up;Sit to/from stand;Adhering to back precautions   Toilet Transfer: Modified Independent;Ambulation Toilet Transfer Details (indicate cue type and reason): no device Toileting- Clothing Manipulation and Hygiene: Modified independent;Sit to/from stand Toileting - Clothing Manipulation Details (indicate cue type and reason): simulated     Functional mobility during ADLs: Modified independent (no device for mobilization in the room) General ADL Comments: Pt able to state 3/3 back precautions.  She was able to complete LB dressing  by lifting LEs up for donning her pants without difficulty.  She reports having a reacher at home if needed and her spouse works from home as well.  No DME needs or follow-up recommended.     Vision Baseline Vision/History: 0 No visual  deficits Ability to See in Adequate Light: 0 Adequate Patient Visual Report: No change from baseline Vision Assessment?: No apparent visual deficits     Perception  Not tested   Praxis  Not tested    Pertinent Vitals/Pain Pain Assessment Pain Assessment: 0-10 Pain Score: 5  Pain Location: lower back Pain Descriptors / Indicators: Discomfort Pain Intervention(s): Limited activity within patient's tolerance, Repositioned, Monitored during session     Hand Dominance Right   Extremity/Trunk Assessment Upper Extremity Assessment Upper Extremity Assessment: Overall WFL for tasks assessed (not formally assessed secondary to back precautions but able to use functionally for dressing tasks without difficulty)   Lower Extremity Assessment Lower Extremity Assessment: Defer to PT evaluation   Cervical / Trunk Assessment Cervical / Trunk Assessment: Back Surgery (LSO in sitting)   Communication Communication Communication: No difficulties   Cognition Arousal/Alertness: Awake/alert Behavior During Therapy: WFL for tasks assessed/performed Overall Cognitive Status: Within Functional Limits for tasks assessed                                                  Home Living Family/patient expects to be discharged to:: Private residence Living Arrangements: Spouse/significant other Available Help at Discharge: Family;Available 24 hours/day Type of Home: House Home Access: Stairs to enter [employer] of Steps: 1 Entrance Stairs-Rails: None Home Layout: One level     Bathroom Shower/Tub: Occupational psychologist: Standard     Home Equipment: Conservation officer, nature (2 wheels);Shower seat;Shower seat - built in          Prior Functioning/Environment Prior Level of Function : Independent/Modified Independent               ADLs Comments: independent with selfcare tasks and ADLs         AM-PAC OT "6 Clicks" Daily Activity     Outcome  Measure Help from another person eating meals?: None Help from another person taking care of personal grooming?: None Help from another person toileting, which includes using toliet, bedpan, or urinal?: None Help from another person bathing (including washing, rinsing, drying)?: A Little Help from another person to put on and taking off regular upper body clothing?: None Help from another person to put on and taking off regular lower body clothing?: None 6 Click Score: 23   End of Session Equipment Utilized During Treatment: Back brace Nurse Communication: Mobility status  Activity Tolerance: Patient tolerated treatment well Patient left: in bed;with call bell/phone within reach;with nursing/sitter in room                   Time: 0821-0835 OT Time Calculation (min): 14 min Charges:  OT General Charges $OT Visit: 1 Visit OT Evaluation $OT Eval Moderate Complexity: 1 Mod  Clyda Greener, OTR/L [place]  Office [phone] 10/30/2022

## 2022-11-08 ENCOUNTER — Encounter (HOSPITAL_COMMUNITY): Payer: Self-pay | Admitting: Neurosurgery

## 2022-11-10 ENCOUNTER — Encounter (HOSPITAL_COMMUNITY): Payer: Self-pay | Admitting: Neurosurgery

## 2022-11-10 MED FILL — Heparin Sodium (Porcine) Inj 1000 Unit/ML: INTRAMUSCULAR | Qty: 30 | Status: AC

## 2022-11-10 MED FILL — Sodium Chloride IV Soln 0.9%: INTRAVENOUS | Qty: 1000 | Status: AC

## 2023-02-24 ENCOUNTER — Encounter: Payer: Self-pay | Admitting: Gastroenterology

## 2023-05-11 ENCOUNTER — Other Ambulatory Visit: Payer: BC Managed Care – PPO

## 2023-05-11 ENCOUNTER — Encounter: Payer: Self-pay | Admitting: Gastroenterology

## 2023-05-11 ENCOUNTER — Ambulatory Visit (INDEPENDENT_AMBULATORY_CARE_PROVIDER_SITE_OTHER): Payer: BC Managed Care – PPO | Admitting: Gastroenterology

## 2023-05-11 ENCOUNTER — Other Ambulatory Visit: Payer: Self-pay | Admitting: Gastroenterology

## 2023-05-11 VITALS — BP 122/80 | HR 81 | Ht 65.0 in | Wt 170.0 lb

## 2023-05-11 DIAGNOSIS — K219 Gastro-esophageal reflux disease without esophagitis: Secondary | ICD-10-CM

## 2023-05-11 DIAGNOSIS — D509 Iron deficiency anemia, unspecified: Secondary | ICD-10-CM | POA: Diagnosis not present

## 2023-05-11 LAB — IBC + FERRITIN
Ferritin: 8.7 ng/mL — ABNORMAL LOW (ref 10.0–291.0)
Iron: 41 ug/dL — ABNORMAL LOW (ref 42–145)
Saturation Ratios: 8.4 % — ABNORMAL LOW (ref 20.0–50.0)
TIBC: 490 ug/dL — ABNORMAL HIGH (ref 250.0–450.0)
Transferrin: 350 mg/dL (ref 212.0–360.0)

## 2023-05-11 LAB — B12 AND FOLATE PANEL
Folate: >24.2 ng/mL (ref >5.9)
Vitamin B-12: 399 pg/mL (ref 211–911)

## 2023-05-11 MED ORDER — DEXLANSOPRAZOLE 60 MG PO CPDR: 60.0000 mg | DELAYED_RELEASE_CAPSULE | Freq: Every day | ORAL | 3 refills | Status: DC

## 2023-05-11 NOTE — Progress Notes (Signed)
Attending Physician's Attestation   I have reviewed the chart.   I agree with the Advanced Practitioner's note, impression, and recommendations with any updates as below. Can see how she does with Dexilant.  Agree with repeat anemia labs.  If evidence of persisting iron deficiency, query moving forward with repeat EGD/colonoscopy +/- video capsule endoscopy versus just moving forward with endoscopy based on her current symptoms and video capsule endoscopy and colonoscopy only after.  Time will tell.   Corliss Parish, MD [facility] Advanced Endoscopy Office # [phone]

## 2023-05-11 NOTE — Progress Notes (Signed)
05/11/2023 Autumn Rose [id number] 28-Dec-1965   HISTORY OF PRESENT ILLNESS: This is a 57 year old female who is new to our office.  He is here today as a self-referral as her son is also a patient here.  She is transferring her care from [facility].  She is here today really just to establish care.  Typically her main issue is acid reflux.  She is currently on pantoprazole 40 mg twice daily and still having a lot of symptoms even with that medication.  Says that on occasion food will still feel like it gets stuck.  She has had some epigastric and left upper quadrant abdominal pains intermittently.  The last time she felt that was last week.  Has belching and regurgitation of food stuff back into her throat at times.  She says she has been on omeprazole in the past as well.  She has had couple of EGDs, the last in 2021 as below, but was normal with normal biopsies.  She tells me that her iron levels were low at the time of her last EGD and colonoscopy and they wanted to consider capsule endoscopy as well, but she never pursued that due to financial reasons.  Last hemoglobin in June 2024 was 11.6 g with a low MCV at 73.8.  No iron studies have been checked for a few years.  Says that she took iron for very short time last year, but did not tolerate it well.  She denies any black or bloody stools.  No menstrual period since 2018.  EGD/Colonoscopy (9/21) = normal UGI tract (bx neg for EoE and Celiac), normal TI/colonic mucosa, no recurrent polyps, and internal hemorrhoids. CABA 9/28.   A. LABELED "DUODENUM BIOPSY": BENIGN SMALL BOWEL MUCOSA WITH NO SIGNIFICANT INFLAMMATION. NO EVIDENCE OF CELIAC SPRUE.   B. LABELED "DISTAL ESOPHAGUS BIOPSY": BENIGN SQUAMOUS MUCOSA WITH NO SIGNIFICANT INFLAMMATION. NO EVIDENCE OF EOSINOPHILIC ESOPHAGITIS.   C. LABELED "PROXIMAL ESOPHAGUS BIOPSY": BENIGN SQUAMOUS MUCOSA WITH NO SIGNIFICANT INFLAMMATION. NO EVIDENCE OF EOSINOPHILIC ESOPHAGITIS.     Past Medical History:  Diagnosis Date   Anxiety    Complication of anesthesia    Bronchospasm after having tubal ligation in 2011   Lumbar pseudoarthrosis    PONV (postoperative nausea and vomiting)    Past Surgical History:  Procedure Laterality Date   ABDOMINAL EXPOSURE N/A 10/29/2022   Procedure: ABDOMINAL EXPOSURE;  Surgeon: Cephus Shelling, MD;  Location: [facility];  Service: Vascular;  Laterality: N/A;   abdominal plasty  05/2022   tummy tuck   ABLATION     ANTERIOR LUMBAR FUSION N/A 10/29/2022   Procedure: Anterior Lumbar Interbody Fusion - Lumbar Five-Sacral One with cage and revision posterior lateral fusion with replacement of pedicle screws;  Surgeon: Julio Sicks, MD;  Location: [facility] OR;  Service: Neurosurgery;  Laterality: N/A;   APPENDECTOMY  2012   BACK SURGERY     BILATERAL CARPAL TUNNEL RELEASE Bilateral 2021   November 30th & December 14th   BREAST IMPLANT REMOVAL     BREAST SURGERY     FOOT SURGERY Left    plantar fascitis   LAMINECTOMY WITH POSTERIOR LATERAL ARTHRODESIS LEVEL 1 N/A 10/29/2022   Procedure: LAMINECTOMY WITH POSTERIOR LATERAL ARTHRODESIS LEVEL ONE;  Surgeon: Julio Sicks, MD;  Location: [facility] OR;  Service: Neurosurgery;  Laterality: N/A;   LIPOMA EXCISION Left 2005   MENISCUS REPAIR Right 2012   TUBAL LIGATION     WISDOM TOOTH EXTRACTION  1991  reports that she quit smoking about 37 years ago. Her smoking use included cigarettes. She has never used smokeless tobacco. She reports current alcohol use. She reports that she does not use drugs. family history includes Aneurysm in her maternal grandfather; Cancer in her father; Epilepsy in her sister and sister; Hyperlipidemia in her father; Hypertension in her mother; Lung cancer in her paternal grandmother; Non-Hodgkin's lymphoma in her father; Stroke in her paternal grandfather. Allergies  Allergen Reactions   Oxycodone Itching   Tape Rash      Outpatient Encounter Medications as of 05/11/2023   Medication Sig   fish oil-omega-3 fatty acids 1000 MG capsule Take 1 g by mouth daily.     fluticasone (FLONASE) 50 MCG/ACT nasal spray Place 1 spray into both nostrils daily as needed for allergies or rhinitis.   Multiple Vitamins-Minerals (MULTIVITAMIN WITH MINERALS) tablet Take 1 tablet by mouth daily.     pantoprazole (PROTONIX) 40 MG tablet Take 40 mg by mouth 2 (two) times daily with a meal.   acetaminophen (TYLENOL) 500 MG tablet Take 500 mg by mouth every 6 (six) hours as needed for moderate pain. (Patient not taking: Reported on 05/11/2023)   Calcium Carbonate-Vitamin D (CALCIUM 600 + D PO) Take 1 tablet by mouth daily.   (Patient not taking: Reported on 05/11/2023)   cetirizine (ZYRTEC) 10 MG tablet Take 10 mg by mouth daily as needed for allergies. (Patient not taking: Reported on 05/11/2023)   methocarbamol (ROBAXIN-750) 750 MG tablet Take 1 tablet (750 mg total) by mouth 3 (three) times daily as needed for muscle spasms.   ondansetron (ZOFRAN) 4 MG tablet Take 1 tablet (4 mg total) by mouth every 8 (eight) hours as needed for nausea or vomiting.   sertraline (ZOLOFT) 100 MG tablet Take 50 mg by mouth daily.   No facility-administered encounter medications on file as of 05/11/2023.     REVIEW OF SYSTEMS  : All other systems reviewed and negative except where noted in the History of Present Illness.   PHYSICAL EXAM: BP 122/80   Pulse 81   Ht 5\' 5"  (1.651 m)   Wt 170 lb (77.1 kg)   BMI 28.29 kg/m  General: Well developed white female in no acute distress Head: Normocephalic and atraumatic Eyes:  Sclerae anicteric, conjunctiva pink. Ears: Normal auditory acuity Lungs: Clear throughout to auscultation; no W/R/R. Heart: Regular rate and rhythm; no M/R/G. Abdomen: Soft, non-distended.  BS present.  Mild epigastric TTP. Musculoskeletal: Symmetrical with no gross deformities  Skin: No lesions on visible extremities Extremities: No edema  Neurological: Alert oriented x 4, grossly  non-focal Psychological:  Alert and cooperative. Normal mood and affect  ASSESSMENT AND PLAN: *GERD and epigastric abdominal pain with burping and regurgitation: Is on pantoprazole 40 mg twice daily and still having symptoms.  Last EGD in September 2021 was normal.  Will see if we can obtain Dexilant 60 mg daily.  Prescription sent to pharmacy.  Consider repeat EGD pending her results with the Dexilant. *Microcytic anemia: Hemoglobin in the 11 g range.  She tells me that her iron levels were low back in 2021 when they did her last EGD and colonoscopy and they wanted to consider doing capsule endoscopy, but she never proceeded due to financial reasons.  Will check B12, folate, and iron levels today.  She did not really tolerate oral iron well when she was on it for short time about a year or so ago.  CC:  [employer], [place] E, *

## 2023-05-11 NOTE — Patient Instructions (Signed)
Your provider has requested that you go to the basement level for lab work before leaving today. Press "B" on the elevator. The lab is located at the first door on the left as you exit the elevator.  We have sent the following medications to your pharmacy for you to pick up at your convenience: Dexilant 60 mg daily.   _______________________________________________________  If your blood pressure at your visit was 140/90 or greater, please contact your primary care physician to follow up on this.  _______________________________________________________  If you are age 7 or older, your body mass index should be between 23-30. Your Body mass index is 28.29 kg/m. If this is out of the aforementioned range listed, please consider follow up with your Primary Care Provider.  If you are age 67 or younger, your body mass index should be between 19-25. Your Body mass index is 28.29 kg/m. If this is out of the aformentioned range listed, please consider follow up with your Primary Care Provider.   ________________________________________________________  The  GI providers would like to encourage you to use [facility] to communicate with providers for non-urgent requests or questions.  Due to long hold times on the telephone, sending your provider a message by [facility] may be a faster and more efficient way to get a response.  Please allow 48 business hours for a response.  Please remember that this is for non-urgent requests.  _______________________________________________________

## 2023-05-12 NOTE — Telephone Encounter (Signed)
Please submit PA.

## 2023-05-13 ENCOUNTER — Other Ambulatory Visit (HOSPITAL_COMMUNITY): Payer: Self-pay

## 2023-05-13 ENCOUNTER — Telehealth: Payer: Self-pay | Admitting: Pharmacy Technician

## 2023-05-13 NOTE — Telephone Encounter (Signed)
PA has been submitted, and telephone encounter has been created. 

## 2023-05-13 NOTE — Telephone Encounter (Signed)
Pharmacy Patient Advocate Encounter   Received notification from RX Request Messages that prior authorization for DEXLANSOPRAZOLE 60MG  is required/requested.   Insurance verification completed.   The patient is insured through  [employer]  .   Per test claim: PA required; PA submitted to [employer] via CoverMyMeds Key/confirmation #/EOC [id number] Status is pending

## 2023-05-17 ENCOUNTER — Other Ambulatory Visit (HOSPITAL_COMMUNITY): Payer: Self-pay

## 2023-05-17 NOTE — Telephone Encounter (Signed)
Pharmacy Patient Advocate Encounter  Received notification from  [employer]  that Prior Authorization for DEXLANSOPRAZOLE 60MG  has been APPROVED from 10.4.24 to 10.31.25   PA #/Case ID/Reference #: [id number]

## 2023-05-19 ENCOUNTER — Telehealth: Payer: Self-pay

## 2023-05-19 DIAGNOSIS — D509 Iron deficiency anemia, unspecified: Secondary | ICD-10-CM

## 2023-05-19 NOTE — Telephone Encounter (Signed)
See the My Chart message for response.

## 2023-05-19 NOTE — Telephone Encounter (Signed)
Please let her know that her B12 and folate levels are normal.  Her iron levels are low.  She did not tolerate oral iron very well.  We could see about getting her set up for IV iron infusions as that will bump those iron levels up better and faster without the GI intolerance/side effects.  Would see if she is able to get the Dexilant that I prescribed today.  Please make her a follow-up with me next available (January would be fine) at which time we can see how she is doing with the Dexilant if she is able to get it and just do a general follow-up after the iron infusions, etc.

## 2023-05-19 NOTE — Addendum Note (Signed)
Addended by: Loretha Stapler on: 05/19/2023 02:05 PM   Modules accepted: Orders

## 2023-05-19 NOTE — Telephone Encounter (Signed)
Left message on machine to call back  Resent message to the pt via My chart as well.

## 2023-05-24 ENCOUNTER — Other Ambulatory Visit: Payer: Self-pay | Admitting: Gastroenterology

## 2023-06-10 ENCOUNTER — Inpatient Hospital Stay: Payer: BC Managed Care – PPO

## 2023-06-10 ENCOUNTER — Encounter: Payer: Self-pay | Admitting: Oncology

## 2023-06-10 ENCOUNTER — Other Ambulatory Visit: Payer: Self-pay | Admitting: Oncology

## 2023-06-10 ENCOUNTER — Inpatient Hospital Stay: Payer: BC Managed Care – PPO | Attending: Oncology | Admitting: Oncology

## 2023-06-10 VITALS — BP 116/83 | HR 96 | Temp 98.2°F | Resp 18 | Ht 65.0 in | Wt 171.5 lb

## 2023-06-10 DIAGNOSIS — D509 Iron deficiency anemia, unspecified: Secondary | ICD-10-CM | POA: Diagnosis not present

## 2023-06-10 DIAGNOSIS — Z87891 Personal history of nicotine dependence: Secondary | ICD-10-CM | POA: Insufficient documentation

## 2023-06-10 DIAGNOSIS — K219 Gastro-esophageal reflux disease without esophagitis: Secondary | ICD-10-CM | POA: Diagnosis not present

## 2023-06-10 LAB — CBC WITH DIFFERENTIAL/PLATELET
Abs Immature Granulocytes: 0.01 10*3/uL (ref 0.00–0.07)
Basophils Absolute: 0.1 10*3/uL (ref 0.0–0.1)
Basophils Relative: 1 %
Eosinophils Absolute: 0.1 10*3/uL (ref 0.0–0.5)
Eosinophils Relative: 3 %
HCT: 39.5 % (ref 36.0–46.0)
Hemoglobin: 12.9 g/dL (ref 12.0–15.0)
Immature Granulocytes: 0 %
Lymphocytes Relative: 32 %
Lymphs Abs: 1.6 10*3/uL (ref 0.7–4.0)
MCH: 25.5 pg — ABNORMAL LOW (ref 26.0–34.0)
MCHC: 32.7 g/dL (ref 30.0–36.0)
MCV: 78.2 fL — ABNORMAL LOW (ref 80.0–100.0)
Monocytes Absolute: 0.5 10*3/uL (ref 0.1–1.0)
Monocytes Relative: 10 %
Neutro Abs: 2.7 10*3/uL (ref 1.7–7.7)
Neutrophils Relative %: 54 %
Platelets: 357 10*3/uL (ref 150–400)
RBC: 5.05 MIL/uL (ref 3.87–5.11)
RDW: 15 % (ref 11.5–15.5)
WBC: 4.9 10*3/uL (ref 4.0–10.5)
nRBC: 0 % (ref 0.0–0.2)

## 2023-06-10 LAB — COMPREHENSIVE METABOLIC PANEL
ALT: 24 U/L (ref 0–44)
AST: 21 U/L (ref 15–41)
Albumin: 4.4 g/dL (ref 3.5–5.0)
Alkaline Phosphatase: 89 U/L (ref 38–126)
Anion gap: 5 (ref 5–15)
BUN: 14 mg/dL (ref 6–20)
CO2: 28 mmol/L (ref 22–32)
Calcium: 9.9 mg/dL (ref 8.9–10.3)
Chloride: 106 mmol/L (ref 98–111)
Creatinine, Ser: 0.78 mg/dL (ref 0.44–1.00)
GFR, Estimated: >60 mL/min (ref >60)
Glucose, Bld: 93 mg/dL (ref 70–99)
Potassium: 3.7 mmol/L (ref 3.5–5.1)
Sodium: 139 mmol/L (ref 135–145)
Total Bilirubin: 0.4 mg/dL (ref 0.3–1.2)
Total Protein: 7.8 g/dL (ref 6.5–8.1)

## 2023-06-10 LAB — IRON AND IRON BINDING CAPACITY (CC-WL,HP ONLY)
Iron: 69 ug/dL (ref 28–170)
Saturation Ratios: 14 % (ref 10.4–31.8)
TIBC: 482 ug/dL — ABNORMAL HIGH (ref 250–450)
UIBC: 413 ug/dL (ref 148–442)

## 2023-06-10 LAB — FERRITIN: Ferritin: 7 ng/mL — ABNORMAL LOW (ref 11–307)

## 2023-06-10 LAB — LACTATE DEHYDROGENASE: LDH: 146 U/L (ref 98–192)

## 2023-06-10 NOTE — Assessment & Plan Note (Addendum)
Chronic history of low iron. Recent blood work revealed low iron levels. No significant bleeding reported. History of constipation and occasional hard stools with possible minor bleeding. No history of abnormal menstrual bleeding.  -Previous oral iron supplementation caused headaches. -Labs today showed improved parameters overall.  Hemoglobin 12.9, hematocrit 39.5.  MCV still low at 78.2.  White count and platelet count normal.  CMP unremarkable.  Iron studies within normal limits.  Ferritin came back low at 7.  LDH normal. -Since ferritin remains low, we will plan for IV iron supplementation with Venofer weekly x 5 doses.  Will arrange this infusion at our infusion center in [place] [employer]. -For now we will plan to see her back in 3-4 months with repeat labs.

## 2023-06-10 NOTE — Assessment & Plan Note (Signed)
Long-standing history of acid reflux. Recent change in medication to Dexilant has improved symptoms. No difficulty swallowing except during flare-ups. Previous esophageal dilation performed few years ago. No history of H. Pylori infection or Barrett's esophagus. -Continue Dexilant as prescribed by gastroenterologist.

## 2023-06-10 NOTE — Progress Notes (Addendum)
[facility]  HEMATOLOGY CLINIC CONSULTATION NOTE    PATIENT NAME: Autumn Rose   MR#: [id number] DOB: 17-Sep-1965  DATE OF SERVICE: 06/10/2023  Patient Care Team: Gillian Scarce as PCP - General (Family Medicine) Zehr, Princella Pellegrini, PA-C as Physician Assistant (Gastroenterology)  REASON FOR CONSULTATION/ CHIEF COMPLAINT:  Evaluation of anemia.  HISTORY OF PRESENT ILLNESS:  Autumn Rose is a 57 y.o. lady with a past medical history of GERD, was referred to our service for evaluation of iron deficiency anemia.    Discussed the use of AI scribe software for clinical note transcription with the patient, who gave verbal consent to proceed.   She recently established with gastroenterology department for GERD.  On 05/11/2023, labs showed ferritin of 8.7, iron saturation 8%, iron decreased at 41, iron binding capacity increased at 490.  B12 and folate were within normal limits.  Apparently she had labs at an outside facility which showed hemoglobin of 11 with microcytosis.  Patient could not tolerate oral iron previously.  Given iron deficiency anemia, she was referred to [place] for further evaluation and consideration of IV iron.  She recently started Dexilant, which has improved her symptoms. She has had endoscopies in the past, with the only intervention being an esophageal dilation. She denies any history of H. Pylori infection or Barrett's esophagus. When her reflux flares up, she experiences difficulty swallowing, but this is not a constant issue.  She has a history of constipation and occasionally notices blood in her stool, which she attributes to straining. She denies any other sources of blood loss, such as nosebleeds or gum bleeds. She has had a colonoscopy within the past five years, which was normal.  She has tried oral iron supplementation in the past, but it caused severe headaches. She also reports a constant dry mouth, which she believes may be a  side effect of her Zoloft medication. She denies any unusual food cravings.  She denies recent chest pain on exertion, shortness of breath on minimal exertion, pre-syncopal episodes, or palpitations.  She had no prior history or diagnosis of cancer. Her age appropriate screening programs are up-to-date.  MEDICAL HISTORY:  Past Medical History:  Diagnosis Date   Anxiety    Complication of anesthesia    Bronchospasm after having tubal ligation in 2011   Lumbar pseudoarthrosis    PONV (postoperative nausea and vomiting)     SURGICAL HISTORY: Past Surgical History:  Procedure Laterality Date   ABDOMINAL EXPOSURE N/A 10/29/2022   Procedure: ABDOMINAL EXPOSURE;  Surgeon: Cephus Shelling, MD;  Location: [facility] OR;  Service: Vascular;  Laterality: N/A;   abdominal plasty  05/2022   tummy tuck   ABLATION     ANTERIOR LUMBAR FUSION N/A 10/29/2022   Procedure: Anterior Lumbar Interbody Fusion - Lumbar Five-Sacral One with cage and revision posterior lateral fusion with replacement of pedicle screws;  Surgeon: Julio Sicks, MD;  Location: [facility] OR;  Service: Neurosurgery;  Laterality: N/A;   APPENDECTOMY  2012   BACK SURGERY     BILATERAL CARPAL TUNNEL RELEASE Bilateral 2021   November 30th & December 14th   BREAST IMPLANT REMOVAL     BREAST SURGERY     FOOT SURGERY Left    plantar fascitis   LAMINECTOMY WITH POSTERIOR LATERAL ARTHRODESIS LEVEL 1 N/A 10/29/2022   Procedure: LAMINECTOMY WITH POSTERIOR LATERAL ARTHRODESIS LEVEL ONE;  Surgeon: Julio Sicks, MD;  Location: [facility] OR;  Service: Neurosurgery;  Laterality: N/A;   LIPOMA  EXCISION Left 2005   MENISCUS REPAIR Right 2012   TUBAL LIGATION     WISDOM TOOTH EXTRACTION  1991    SOCIAL HISTORY: She reports that she quit smoking about 37 years ago. Her smoking use included cigarettes. She has never used smokeless tobacco. She reports current alcohol use. She reports that she does not use drugs. Social History   Socioeconomic History   Marital  status: Married    Spouse name: Weyman Croon   Number of children: 1   Years of education: Not on file   Highest education level: Not on file  Occupational History   Occupation: Advertising account planner  Tobacco Use   Smoking status: Former    Current packs/day: 0.00    Types: Cigarettes    Quit date: 05/17/1986    Years since quitting: 37.0   Smokeless tobacco: Never  Vaping Use   Vaping status: Never Used  Substance and Sexual Activity   Alcohol use: Yes    Comment: occ   Drug use: No   Sexual activity: Yes    Birth control/protection: Surgical  Other Topics Concern   Not on file  Social History Narrative   Not on file   Social Determinants of Health   Financial Resource Strain: Not on file  Food Insecurity: No Food Insecurity (06/10/2023)   Hunger Vital Sign    Worried About Running Out of Food in the Last Year: Never true    Ran Out of Food in the Last Year: Never true  Transportation Needs: No Transportation Needs (06/10/2023)   PRAPARE - Administrator, Civil Service (Medical): No    Lack of Transportation (Non-Medical): No  Physical Activity: Sufficiently Active (05/15/2019)   Received from [facility] visits prior to 10/09/2022., [facility] visits prior to 10/09/2022.   Exercise Vital Sign    Days of Exercise per Week: 7 days    Minutes of Exercise per Session: 60 min  Stress: Not on file  Social Connections: Not on file  Intimate Partner Violence: Not At Risk (06/10/2023)   Humiliation, Afraid, Rape, and Kick questionnaire    Fear of Current or Ex-Partner: No    Emotionally Abused: No    Physically Abused: No    Sexually Abused: No    FAMILY HISTORY: Family History  Problem Relation Age of Onset   Hypertension Mother    Hyperlipidemia Father    Cancer Father    Non-Hodgkin's lymphoma Father    Aneurysm Maternal Grandfather    Lung cancer Paternal Grandmother    Stroke Paternal Grandfather    Epilepsy Sister     Epilepsy Sister     ALLERGIES:  She is allergic to oxycodone and tape.  MEDICATIONS:  Current Outpatient Medications  Medication Sig Dispense Refill   Calcium Carbonate-Vitamin D (CALCIUM 600 + D PO) Take 1 tablet by mouth daily.     cetirizine (ZYRTEC) 10 MG tablet Take 10 mg by mouth daily as needed for allergies.     dexlansoprazole (DEXILANT) 60 MG capsule Take 1 capsule (60 mg total) by mouth daily. 90 capsule 3   fish oil-omega-3 fatty acids 1000 MG capsule Take 1 g by mouth daily.       fluticasone (FLONASE) 50 MCG/ACT nasal spray Place 1 spray into both nostrils daily as needed for allergies or rhinitis.     Multiple Vitamins-Minerals (MULTIVITAMIN WITH MINERALS) tablet Take 1 tablet by mouth daily.       sertraline (ZOLOFT)  100 MG tablet Take 50 mg by mouth daily.     topiramate (TOPAMAX) 25 MG tablet Take 25 mg by mouth daily.     No current facility-administered medications for this visit.    REVIEW OF SYSTEMS:    Review of Systems - Oncology  All other pertinent systems were reviewed and were negative except as mentioned above.  PHYSICAL EXAMINATION:  ECOG PERFORMANCE STATUS: 1 - Symptomatic but completely ambulatory  Vitals:   06/10/23 0911 06/10/23 0925  BP: 116/83   Pulse: (!) 103 96  Resp: 18   Temp: 98.2 F (36.8 C)   SpO2: 100%    Filed Weights   06/10/23 0911  Weight: 171 lb 8 oz (77.8 kg)    Physical Exam Constitutional:      General: She is not in acute distress.    Appearance: Normal appearance. She is well-developed.  HENT:     Head: Normocephalic and atraumatic.  Eyes:     General: No scleral icterus.    Extraocular Movements: Extraocular movements intact.     Conjunctiva/sclera: Conjunctivae normal.  Cardiovascular:     Rate and Rhythm: Normal rate and regular rhythm.     Heart sounds: Normal heart sounds.  Pulmonary:     Effort: Pulmonary effort is normal. No respiratory distress.     Breath sounds: Normal breath sounds.   Abdominal:     General: There is no distension.  Musculoskeletal:     Right lower leg: No edema.     Left lower leg: No edema.  Neurological:     General: No focal deficit present.     Mental Status: She is alert and oriented to person, place, and time.  Psychiatric:        Mood and Affect: Mood normal.        Behavior: Behavior normal.        Thought Content: Thought content normal.        Judgment: Judgment normal.     LABORATORY DATA:   I have reviewed the data as listed.  Results for orders placed or performed in visit on 06/10/23  Lactate dehydrogenase  Result Value Ref Range   LDH 146 98 - 192 U/L  Ferritin  Result Value Ref Range   Ferritin 7 (L) 11 - 307 ng/mL  Iron and Iron Binding Capacity ([facility] only)  Result Value Ref Range   Iron 69 28 - 170 ug/dL   TIBC 409 (H) 811 - 914 ug/dL   Saturation Ratios 14 10.4 - 31.8 %   UIBC 413 148 - 442 ug/dL  Comprehensive metabolic panel  Result Value Ref Range   Sodium 139 135 - 145 mmol/L   Potassium 3.7 3.5 - 5.1 mmol/L   Chloride 106 98 - 111 mmol/L   CO2 28 22 - 32 mmol/L   Glucose, Bld 93 70 - 99 mg/dL   BUN 14 6 - 20 mg/dL   Creatinine, Ser 7.82 0.44 - 1.00 mg/dL   Calcium 9.9 8.9 - 95.6 mg/dL   Total Protein 7.8 6.5 - 8.1 g/dL   Albumin 4.4 3.5 - 5.0 g/dL   AST 21 15 - 41 U/L   ALT 24 0 - 44 U/L   Alkaline Phosphatase 89 38 - 126 U/L   Total Bilirubin 0.4 0.3 - 1.2 mg/dL   GFR, Estimated >21 >30 mL/min   Anion gap 5 5 - 15  CBC with Differential/Platelet  Result Value Ref Range   WBC 4.9 4.0 - 10.5 K/uL  RBC 5.05 3.87 - 5.11 MIL/uL   Hemoglobin 12.9 12.0 - 15.0 g/dL   HCT 81.1 91.4 - 78.2 %   MCV 78.2 (L) 80.0 - 100.0 fL   MCH 25.5 (L) 26.0 - 34.0 pg   MCHC 32.7 30.0 - 36.0 g/dL   RDW 95.6 21.3 - 08.6 %   Platelets 357 150 - 400 K/uL   nRBC 0.0 0.0 - 0.2 %   Neutrophils Relative % 54 %   Neutro Abs 2.7 1.7 - 7.7 K/uL   Lymphocytes Relative 32 %   Lymphs Abs 1.6 0.7 - 4.0 K/uL   Monocytes  Relative 10 %   Monocytes Absolute 0.5 0.1 - 1.0 K/uL   Eosinophils Relative 3 %   Eosinophils Absolute 0.1 0.0 - 0.5 K/uL   Basophils Relative 1 %   Basophils Absolute 0.1 0.0 - 0.1 K/uL   Immature Granulocytes 0 %   Abs Immature Granulocytes 0.01 0.00 - 0.07 K/uL     RADIOGRAPHIC STUDIES:  No pertinent radiological studies available to review.   ASSESSMENT & PLAN:   57 y.o. lady with a past medical history of GERD, was referred to our service for evaluation of iron deficiency anemia.    Microcytic anemia Chronic history of low iron. Recent blood work revealed low iron levels. No significant bleeding reported. History of constipation and occasional hard stools with possible minor bleeding. No history of abnormal menstrual bleeding.  -Previous oral iron supplementation caused headaches. -Labs today showed improved parameters overall.  Hemoglobin 12.9, hematocrit 39.5.  MCV still low at 78.2.  White count and platelet count normal.  CMP unremarkable.  Iron studies within normal limits.  Ferritin came back low at 7.  LDH normal. -Since ferritin remains low, we will plan for IV iron supplementation with Venofer weekly x 5 doses.  Will arrange this infusion at our infusion center in [place] [employer]. -For now we will plan to see her back in 3-4 months with repeat labs.  Gastroesophageal reflux disease Long-standing history of acid reflux. Recent change in medication to Dexilant has improved symptoms. No difficulty swallowing except during flare-ups. Previous esophageal dilation performed few years ago. No history of H. Pylori infection or Barrett's esophagus. -Continue Dexilant as prescribed by gastroenterologist.   Since the cause of anemia seems to be obvious from iron deficiency, I am not pursuing extensive workup at this time.  If inadequate response to IV iron is noted, we will pursue workup to rule out other etiologies.  Orders Placed This Encounter  Procedures   CBC with  Differential/Platelet    Standing Status:   Future    Number of Occurrences:   1    Standing Expiration Date:   06/09/2024   Comprehensive metabolic panel    Standing Status:   Future    Number of Occurrences:   1    Standing Expiration Date:   06/09/2024   Iron and Iron Binding Capacity ([facility] only)    Standing Status:   Future    Number of Occurrences:   1    Standing Expiration Date:   06/09/2024   Ferritin    Standing Status:   Future    Number of Occurrences:   1    Standing Expiration Date:   06/09/2024   Lactate dehydrogenase    Standing Status:   Future    Number of Occurrences:   1    Standing Expiration Date:   06/09/2024     The total time spent in  the appointment was 55 minutes encounter with patients including review of chart and various tests results, discussions about plan of care and coordination of care plan.  I reviewed lab results and outside records for this visit and discussed relevant results with the patient. Diagnosis, plan of care and treatment options were also discussed in detail with the patient. Opportunity provided to ask questions and answers provided to her apparent satisfaction. Provided instructions to call our clinic with any problems, questions or concerns prior to return visit. I recommended to continue follow-up with PCP and sub-specialists. She verbalized understanding and agreed with the plan. No barriers to learning was detected.   Future Appointments  Date Time Provider Department Center  10/12/2023 11:15 AM [facility] None  10/12/2023 11:40 AM Analy Bassford, Archie Patten, MD [facility] None     Meryl Crutch, MD [facility] [address] [place] [address] Dept: [phone] Dept Fax: [phone]  06/10/2023 3:03 PM   This document was completed utilizing speech recognition software. Grammatical errors, random word insertions, pronoun errors, and incomplete sentences are an occasional  consequence of this system due to software limitations, ambient noise, and hardware issues. Any formal questions or concerns about the content, text or information contained within the body of this dictation should be directly addressed to the provider for clarification.

## 2023-06-13 ENCOUNTER — Telehealth: Payer: Self-pay | Admitting: Pharmacy Technician

## 2023-06-13 NOTE — Telephone Encounter (Signed)
Dr. Arlana Pouch, [place] Childes note:  Patient will be scheduled as soon as possible.  Auth Submission: NO AUTH NEEDED Site of care: Site of care: [facility] Payer: BCBS Medication & CPT/J Code(s) submitted: Venofer (Iron Sucrose) J1756 Route of submission (phone, fax, portal):  Phone # Fax # Auth type: Buy/Bill PB Units/visits requested: 5 Reference number:  Approval from: 06/13/23 to 08/09/23

## 2023-06-23 ENCOUNTER — Ambulatory Visit: Payer: BC Managed Care – PPO

## 2023-06-23 VITALS — BP 122/82 | HR 75 | Temp 98.4°F | Resp 16 | Ht 65.5 in | Wt 173.8 lb

## 2023-06-23 DIAGNOSIS — D509 Iron deficiency anemia, unspecified: Secondary | ICD-10-CM

## 2023-06-23 MED ORDER — ANTICOAGULANT SODIUM CITRATE 4% (200MG/5ML) IV SOLN: 5.0000 mL | Freq: Once | Status: DC | PRN

## 2023-06-23 MED ORDER — ACETAMINOPHEN 325 MG PO TABS
650.0000 mg | ORAL_TABLET | Freq: Once | ORAL | Status: AC
  Administered 2023-06-23: 650 mg via ORAL
  Filled 2023-06-23: qty 2

## 2023-06-23 MED ORDER — ALTEPLASE 2 MG IJ SOLR: 2.0000 mg | Freq: Once | INTRAMUSCULAR | Status: DC | PRN

## 2023-06-23 MED ORDER — SODIUM CHLORIDE 0.9% FLUSH: 3.0000 mL | Freq: Once | INTRAVENOUS | Status: DC | PRN

## 2023-06-23 MED ORDER — IRON SUCROSE 20 MG/ML IV SOLN
200.0000 mg | Freq: Once | INTRAVENOUS | Status: AC
  Administered 2023-06-23: 200 mg via INTRAVENOUS
  Filled 2023-06-23: qty 10

## 2023-06-23 MED ORDER — SODIUM CHLORIDE 0.9% FLUSH
10.0000 mL | Freq: Once | INTRAVENOUS | Status: DC | PRN
Start: 2023-06-23 — End: 2023-06-23

## 2023-06-23 MED ORDER — HEPARIN SOD (PORK) LOCK FLUSH 100 UNIT/ML IV SOLN: 250.0000 [IU] | Freq: Once | INTRAVENOUS | Status: DC | PRN

## 2023-06-23 MED ORDER — HEPARIN SOD (PORK) LOCK FLUSH 100 UNIT/ML IV SOLN
500.0000 [IU] | Freq: Once | INTRAVENOUS | Status: DC | PRN
Start: 2023-06-23 — End: 2023-06-23

## 2023-06-23 MED ORDER — DIPHENHYDRAMINE HCL 25 MG PO CAPS
25.0000 mg | ORAL_CAPSULE | Freq: Once | ORAL | Status: AC
  Administered 2023-06-23: 25 mg via ORAL
  Filled 2023-06-23: qty 1

## 2023-06-23 NOTE — Progress Notes (Signed)
Diagnosis: Iron Deficiency Anemia  Provider:  Chilton Greathouse MD  Procedure: IV Push  IV Type: Peripheral, IV Location: right wrist  Albumin 25%, Venofer (Iron Sucrose), Dose: 200 mg  Post Infusion IV Care: Observation period completed and Peripheral IV Discontinued  Discharge: Condition: Good, Destination: Home . AVS Declined  Performed by:  Rico Ala, LPN

## 2023-06-27 NOTE — Telephone Encounter (Signed)
Please advise 

## 2023-06-28 ENCOUNTER — Other Ambulatory Visit: Payer: Self-pay | Admitting: *Deleted

## 2023-06-28 DIAGNOSIS — K219 Gastro-esophageal reflux disease without esophagitis: Secondary | ICD-10-CM

## 2023-06-28 DIAGNOSIS — D509 Iron deficiency anemia, unspecified: Secondary | ICD-10-CM

## 2023-06-30 ENCOUNTER — Ambulatory Visit: Payer: BC Managed Care – PPO

## 2023-06-30 VITALS — BP 131/85 | HR 61 | Temp 97.9°F | Resp 18 | Ht 65.0 in | Wt 172.6 lb

## 2023-06-30 DIAGNOSIS — D509 Iron deficiency anemia, unspecified: Secondary | ICD-10-CM | POA: Diagnosis not present

## 2023-06-30 MED ORDER — ACETAMINOPHEN 325 MG PO TABS
650.0000 mg | ORAL_TABLET | Freq: Once | ORAL | Status: AC
  Administered 2023-06-30: 650 mg via ORAL
  Filled 2023-06-30: qty 2

## 2023-06-30 MED ORDER — IRON SUCROSE 20 MG/ML IV SOLN
200.0000 mg | Freq: Once | INTRAVENOUS | Status: AC
  Administered 2023-06-30: 200 mg via INTRAVENOUS
  Filled 2023-06-30: qty 10

## 2023-06-30 MED ORDER — DIPHENHYDRAMINE HCL 25 MG PO CAPS
25.0000 mg | ORAL_CAPSULE | Freq: Once | ORAL | Status: AC
  Administered 2023-06-30: 25 mg via ORAL
  Filled 2023-06-30: qty 1

## 2023-06-30 NOTE — Progress Notes (Signed)
 Diagnosis: Iron Deficiency Anemia  Provider:  Chilton Greathouse MD  Procedure: IV Push  IV Type: Peripheral, IV Location: L Antecubital  Venofer (Iron Sucrose), Dose: 200 mg  Post Infusion IV Care: Observation period completed and Peripheral IV Discontinued  Discharge: Condition: Good, Destination: Home . AVS Declined  Performed by:  Loney Hering, LPN

## 2023-07-05 ENCOUNTER — Ambulatory Visit: Payer: BC Managed Care – PPO | Admitting: Gastroenterology

## 2023-07-05 ENCOUNTER — Encounter: Payer: Self-pay | Admitting: Gastroenterology

## 2023-07-05 ENCOUNTER — Encounter: Payer: Self-pay | Admitting: Neurology

## 2023-07-05 ENCOUNTER — Other Ambulatory Visit: Payer: Self-pay | Admitting: Gastroenterology

## 2023-07-05 VITALS — BP 130/76 | HR 64 | Temp 96.8°F | Resp 11 | Ht 65.0 in | Wt 170.0 lb

## 2023-07-05 DIAGNOSIS — K3189 Other diseases of stomach and duodenum: Secondary | ICD-10-CM | POA: Diagnosis not present

## 2023-07-05 DIAGNOSIS — K449 Diaphragmatic hernia without obstruction or gangrene: Secondary | ICD-10-CM

## 2023-07-05 DIAGNOSIS — K2100 Gastro-esophageal reflux disease with esophagitis, without bleeding: Secondary | ICD-10-CM | POA: Diagnosis present

## 2023-07-05 DIAGNOSIS — K2289 Other specified disease of esophagus: Secondary | ICD-10-CM

## 2023-07-05 DIAGNOSIS — K219 Gastro-esophageal reflux disease without esophagitis: Secondary | ICD-10-CM | POA: Diagnosis not present

## 2023-07-05 DIAGNOSIS — K222 Esophageal obstruction: Secondary | ICD-10-CM | POA: Diagnosis not present

## 2023-07-05 MED ORDER — SODIUM CHLORIDE 0.9 % IV SOLN: 500.0000 mL | Freq: Once | INTRAVENOUS | Status: DC

## 2023-07-05 MED ORDER — RABEPRAZOLE SODIUM 20 MG PO TBEC: 20.0000 mg | DELAYED_RELEASE_TABLET | Freq: Two times a day (BID) | ORAL | 3 refills | Status: DC

## 2023-07-05 NOTE — Progress Notes (Signed)
Vss nad trans to pacu 

## 2023-07-05 NOTE — Patient Instructions (Signed)
Thank you for letting us take care of your healthcare needs today. Please see handouts given to you on Esophagitis, Hiatal Hernia , Gastritis and Dilation Diet Protocol. Use Cepacol and or Chloraseptic spray for the next 3-4 day. You were started on a new medication Aciphex 20 mg for twice a day for the Esophagitis.      YOU HAD AN ENDOSCOPIC PROCEDURE TODAY AT THE [facility]:   Refer to the procedure report that was given to you for any specific questions about what was found during the examination.  If the procedure report does not answer your questions, please call your gastroenterologist to clarify.  If you requested that your care partner not be given the details of your procedure findings, then the procedure report has been included in a sealed envelope for you to review at your convenience later.  YOU SHOULD EXPECT: Some feelings of bloating in the abdomen. Passage of more gas than usual.  Walking can help get rid of the air that was put into your GI tract during the procedure and reduce the bloating. If you had a lower endoscopy (such as a colonoscopy or flexible sigmoidoscopy) you may notice spotting of blood in your stool or on the toilet paper. If you underwent a bowel prep for your procedure, you may not have a normal bowel movement for a few days.  Please Note:  You might notice some irritation and congestion in your nose or some drainage.  This is from the oxygen used during your procedure.  There is no need for concern and it should clear up in a day or so.  SYMPTOMS TO REPORT IMMEDIATELY:   Following upper endoscopy (EGD)  Vomiting of blood or coffee ground material  New chest pain or pain under the shoulder blades  Painful or persistently difficult swallowing  New shortness of breath  Fever of 100F or higher  Black, tarry-looking stools  For urgent or emergent issues, a gastroenterologist can be reached at any hour by calling [phone]. Do not use  MyChart messaging for urgent concerns.    DIET:  Follow the Dilation diet protocol for the rest of the day and tomorrow you may proceed to your regular diet.  Drink plenty of fluids but you should avoid alcoholic beverages for 24 hours.  ACTIVITY:  You should plan to take it easy for the rest of today and you should NOT DRIVE or use heavy machinery until tomorrow (because of the sedation medicines used during the test).    FOLLOW UP: Our staff will call the number listed on your records the next business day following your procedure.  We will call around 7:15- 8:00 am to check on you and address any questions or concerns that you may have regarding the information given to you following your procedure. If we do not reach you, we will leave a message.     If any biopsies were taken you will be contacted by phone or by letter within the next 1-3 weeks.  Please call us at [phone] if you have not heard about the biopsies in 3 weeks.    SIGNATURES/CONFIDENTIALITY: You and/or your care partner have signed paperwork which will be entered into your electronic medical record.  These signatures attest to the fact that that the information above on your After Visit Summary has been reviewed and is understood.  Full responsibility of the confidentiality of this discharge information lies with you and/or your care-partner.

## 2023-07-05 NOTE — Progress Notes (Signed)
GASTROENTEROLOGY PROCEDURE H&P NOTE   Primary Care Physician: Burnis Medin, [place]  HPI: Autumn Rose is a 57 y.o. female who presents for EGD for progressive GERD.  Past Medical History:  Diagnosis Date   Anxiety    Complication of anesthesia    Bronchospasm after having tubal ligation in 2011   Lumbar pseudoarthrosis    PONV (postoperative nausea and vomiting)    Past Surgical History:  Procedure Laterality Date   ABDOMINAL EXPOSURE N/A 10/29/2022   Procedure: ABDOMINAL EXPOSURE;  Surgeon: Cephus Shelling, MD;  Location: [facility] OR;  Service: Vascular;  Laterality: N/A;   abdominal plasty  05/2022   tummy tuck   ABLATION     ANTERIOR LUMBAR FUSION N/A 10/29/2022   Procedure: Anterior Lumbar Interbody Fusion - Lumbar Five-Sacral One with cage and revision posterior lateral fusion with replacement of pedicle screws;  Surgeon: Julio Sicks, MD;  Location: [facility] OR;  Service: Neurosurgery;  Laterality: N/A;   APPENDECTOMY  2012   BACK SURGERY     BILATERAL CARPAL TUNNEL RELEASE Bilateral 2021   November 30th & December 14th   BREAST IMPLANT REMOVAL     BREAST SURGERY     FOOT SURGERY Left    plantar fascitis   LAMINECTOMY WITH POSTERIOR LATERAL ARTHRODESIS LEVEL 1 N/A 10/29/2022   Procedure: LAMINECTOMY WITH POSTERIOR LATERAL ARTHRODESIS LEVEL ONE;  Surgeon: Julio Sicks, MD;  Location: [facility] OR;  Service: Neurosurgery;  Laterality: N/A;   LIPOMA EXCISION Left 2005   MENISCUS REPAIR Right 2012   TUBAL LIGATION     WISDOM TOOTH EXTRACTION  1991   Current Outpatient Medications  Medication Sig Dispense Refill   Calcium Carbonate-Vitamin D (CALCIUM 600 + D PO) Take 1 tablet by mouth daily.     cetirizine (ZYRTEC) 10 MG tablet Take 10 mg by mouth daily as needed for allergies.     dexlansoprazole (DEXILANT) 60 MG capsule Take 1 capsule (60 mg total) by mouth daily. 90 capsule 3   fish oil-omega-3 fatty acids 1000 MG capsule Take 1 g by mouth daily.       fluticasone  (FLONASE) 50 MCG/ACT nasal spray Place 1 spray into both nostrils daily as needed for allergies or rhinitis.     Multiple Vitamins-Minerals (MULTIVITAMIN WITH MINERALS) tablet Take 1 tablet by mouth daily.       sertraline (ZOLOFT) 100 MG tablet Take 50 mg by mouth daily.     topiramate (TOPAMAX) 25 MG tablet Take 25 mg by mouth daily.     No current facility-administered medications for this visit.    Current Outpatient Medications:    Calcium Carbonate-Vitamin D (CALCIUM 600 + D PO), Take 1 tablet by mouth daily., Disp: , Rfl:    cetirizine (ZYRTEC) 10 MG tablet, Take 10 mg by mouth daily as needed for allergies., Disp: , Rfl:    dexlansoprazole (DEXILANT) 60 MG capsule, Take 1 capsule (60 mg total) by mouth daily., Disp: 90 capsule, Rfl: 3   fish oil-omega-3 fatty acids 1000 MG capsule, Take 1 g by mouth daily.  , Disp: , Rfl:    fluticasone (FLONASE) 50 MCG/ACT nasal spray, Place 1 spray into both nostrils daily as needed for allergies or rhinitis., Disp: , Rfl:    Multiple Vitamins-Minerals (MULTIVITAMIN WITH MINERALS) tablet, Take 1 tablet by mouth daily.  , Disp: , Rfl:    sertraline (ZOLOFT) 100 MG tablet, Take 50 mg by mouth daily., Disp: , Rfl:    topiramate (TOPAMAX) 25 MG  tablet, Take 25 mg by mouth daily., Disp: , Rfl:  Allergies  Allergen Reactions   Oxycodone Itching   Tape Rash   Family History  Problem Relation Age of Onset   Hypertension Mother    Hyperlipidemia Father    Cancer Father    Non-Hodgkin's lymphoma Father    Aneurysm Maternal Grandfather    Lung cancer Paternal Grandmother    Stroke Paternal Grandfather    Epilepsy Sister    Epilepsy Sister    Social History   Socioeconomic History   Marital status: Married    Spouse name: Autumn Rose   Number of children: 1   Years of education: Not on file   Highest education level: Not on file  Occupational History   Occupation: Advertising account planner  Tobacco Use   Smoking status: Former    Current packs/day: 0.00     Types: Cigarettes    Quit date: 05/17/1986    Years since quitting: 37.1   Smokeless tobacco: Never  Vaping Use   Vaping status: Never Used  Substance and Sexual Activity   Alcohol use: Yes    Comment: occ   Drug use: No   Sexual activity: Yes    Birth control/protection: Surgical  Other Topics Concern   Not on file  Social History Narrative   Not on file   Social Determinants of Health   Financial Resource Strain: Not on file  Food Insecurity: No Food Insecurity (06/10/2023)   Hunger Vital Sign    Worried About Running Out of Food in the Last Year: Never true    Ran Out of Food in the Last Year: Never true  Transportation Needs: No Transportation Needs (06/10/2023)   PRAPARE - Administrator, Civil Service (Medical): No    Lack of Transportation (Non-Medical): No  Physical Activity: Sufficiently Active (05/15/2019)   Received from [facility] visits prior to 10/09/2022., [facility] visits prior to 10/09/2022.   Exercise Vital Sign    Days of Exercise per Week: 7 days    Minutes of Exercise per Session: 60 min  Stress: Not on file  Social Connections: Not on file  Intimate Partner Violence: Not At Risk (06/10/2023)   Humiliation, Afraid, Rape, and Kick questionnaire    Fear of Current or Ex-Partner: No    Emotionally Abused: No    Physically Abused: No    Sexually Abused: No    Physical Exam: There were no vitals filed for this visit. There is no height or weight on file to calculate BMI. GEN: NAD EYE: Sclerae anicteric ENT: MMM CV: Non-tachycardic GI: Soft, NT/ND NEURO:  Alert & Oriented x 3  Lab Results: No results for input(s): "WBC", "HGB", "HCT", "PLT" in the last 72 hours. BMET No results for input(s): "NA", "K", "CL", "CO2", "GLUCOSE", "BUN", "CREATININE", "CALCIUM" in the last 72 hours. LFT No results for input(s): "PROT", "ALBUMIN", "AST", "ALT", "ALKPHOS", "BILITOT", "BILIDIR", "IBILI" in the last 72  hours. PT/INR No results for input(s): "LABPROT", "INR" in the last 72 hours.   Impression / Plan: This is a 57 y.o.female who presents for EGD for progressive GERD.  The risks and benefits of endoscopic evaluation/treatment were discussed with the patient and/or family; these include but are not limited to the risk of perforation, infection, bleeding, missed lesions, lack of diagnosis, severe illness requiring hospitalization, as well as anesthesia and sedation related illnesses.  The patient's history has been reviewed, patient examined, no change in status,  and deemed stable for procedure.  The patient and/or family is agreeable to proceed.    Corliss Parish, MD [facility] Office # [phone]

## 2023-07-05 NOTE — Op Note (Signed)
[facility] Patient Name: Autumn Rose Procedure Date: 07/05/2023 3:22 PM MRN: [id number] Endoscopist: Corliss Parish , MD, [id number] Age: 57 Referring MD:  Date of Birth: 12-26-1965 Gender: Female Account #: [id number] Procedure:                Upper GI endoscopy Indications:              Dysphagia, Heartburn, Esophageal reflux symptoms                            that recur despite appropriate therapy Medicines:                Monitored Anesthesia Care Procedure:                Pre-Anesthesia Assessment:                           - Prior to the procedure, a History and Physical                            was performed, and patient medications and                            allergies were reviewed. The patient's tolerance of                            previous anesthesia was also reviewed. The risks                            and benefits of the procedure and the sedation                            options and risks were discussed with the patient.                            All questions were answered, and informed consent                            was obtained. Prior Anticoagulants: The patient has                            taken no anticoagulant or antiplatelet agents. ASA                            Grade Assessment: II - A patient with mild systemic                            disease. After reviewing the risks and benefits,                            the patient was deemed in satisfactory condition to                            undergo the procedure.  After obtaining informed consent, the endoscope was                            passed under direct vision. Throughout the                            procedure, the patient's blood pressure, pulse, and                            oxygen saturations were monitored continuously. The                            Olympus Scope [id number] was introduced through the                            mouth,  and advanced to the second part of duodenum.                            The upper GI endoscopy was accomplished without                            difficulty. The patient tolerated the procedure. Scope In: Scope Out: Findings:                 No gross lesions were noted in the majority of the                            esophagus. Biopsies were taken with a cold forceps                            for histology for EOE/LOE rule out. After the rest                            of the EGD was completed, a guidewire was placed                            and the scope was withdrawn. Dilation was performed                            with a Savary dilator with mild resistance at 18                            mm. The dilation site was examined following                            endoscope reinsertion and showed just below the                            UES, mild mucosal disruption, mild improvement in                            luminal narrowing and no perforation.  LA Grade A (one or more mucosal breaks less than 5                            mm, not extending between tops of 2 mucosal folds)                            esophagitis with no bleeding was found in the                            distal esophagus.                           A non-obstructing Schatzki ring was found at the                            gastroesophageal junction. Disruption biopsies were                            taken with a cold forceps for histology                            circumferentially around the ring.                           A 3 cm hiatal hernia was present.                           Patchy mildly erythematous mucosa without bleeding                            was found in the gastric antrum.                           No other gross lesions were noted in the entire                            examined stomach. Biopsies were taken with a cold                            forceps for  histology and Helicobacter pylori                            testing.                           No gross lesions were noted in the duodenal bulb,                            in the first portion of the duodenum and in the                            second portion of the duodenum. Complications:            No immediate complications. Estimated Blood Loss:     Estimated blood  loss was minimal. Impression:               - No gross lesions in the majority of the                            esophagus. Biopsied. Dilated to 18 mm savory                            (mucosal wrent just below the UES).                           - LA Grade A esophagitis with no bleeding found                            distally.                           - Non-obstructing Schatzki ring. Disrupted.                           - 3 cm hiatal hernia.                           - Erythematous mucosa in the antrum. No other gross                            lesions in the entire stomach. Biopsied.                           - No gross lesions in the duodenal bulb, in the                            first portion of the duodenum and in the second                            portion of the duodenum. Recommendation:           - The patient will be observed post-procedure,                            until all discharge criteria are met.                           - Discharge patient to home.                           - Patient has a contact number available for                            emergencies. The signs and symptoms of potential                            delayed complications were discussed with the  patient. Return to normal activities tomorrow.                            Written discharge instructions were provided to the                            patient.                           - Dilation diet as per protocol.                           - Please use Cepacol or Halls Lozenges +/-                             Chloraseptic spray for next 72-96 hours to aid in                            sore thoat should you experience this.                           - Transition to Aciphex 20 mg twice daily.                           - Continue present medications.                           - Await pathology results.                           - Consider repeat EGD in 3 to 4 months to ensure                            healing of esophagitis. If patient continues to                            have issues on Aciphex, query consideration of                            transition to Voquenza. Treatment of her symptoms                            could be considered as well with HH treatment and                            fundoplication if necessary in future.                           - The findings and recommendations were discussed                            with the patient.                           - The findings and recommendations were  discussed                            with the patient's family. Corliss Parish, MD 07/05/2023 3:44:37 PM

## 2023-07-05 NOTE — Progress Notes (Signed)
Pt's states no medical or surgical changes since previsit or office visit. 

## 2023-07-06 ENCOUNTER — Other Ambulatory Visit (HOSPITAL_COMMUNITY): Payer: Self-pay

## 2023-07-06 ENCOUNTER — Telehealth: Payer: Self-pay

## 2023-07-06 NOTE — Telephone Encounter (Signed)
No PA is needed

## 2023-07-06 NOTE — Telephone Encounter (Signed)
  Follow up Call-     07/05/2023    2:49 PM  Call back number  Post procedure Call Back phone  # [phone]  Permission to leave phone message Yes     Patient questions:  Do you have a fever, pain , or abdominal swelling? No. Pain Score  0 *  Have you tolerated food without any problems? Yes.    Have you been able to return to your normal activities? Yes.    Do you have any questions about your discharge instructions: Diet   No. Medications  No. Follow up visit  No.  Do you have questions or concerns about your Care? Yes.   Pt reports she was not able to get prescription filled/ covered by insurance. Pt states pharmacy will contact office.  Pt reports throat is still uncomfortable, denies pain. Advised to use throat lozenges or chloroseptic spray for relief. Informed if it does not improve or gets worse to call us back. Pt verbalizes understanding.  Actions: * If pain score is 4 or above: No action needed, pain <4.

## 2023-07-10 IMAGING — RF DG LUMBAR SPINE COMPLETE 4+V
1 series · 4 of 4 positions shown · non-contrast
Comparison: Lumbar spine MRI 08/20/2021.

CLINICAL DATA: Right L5-S1 transforaminal lumbar interbody fusion
with screws.

EXAM:
LUMBAR SPINE - COMPLETE 4+ VIEW

[Series 1: run · 4 of 4 slices shown]
[im 1/4]
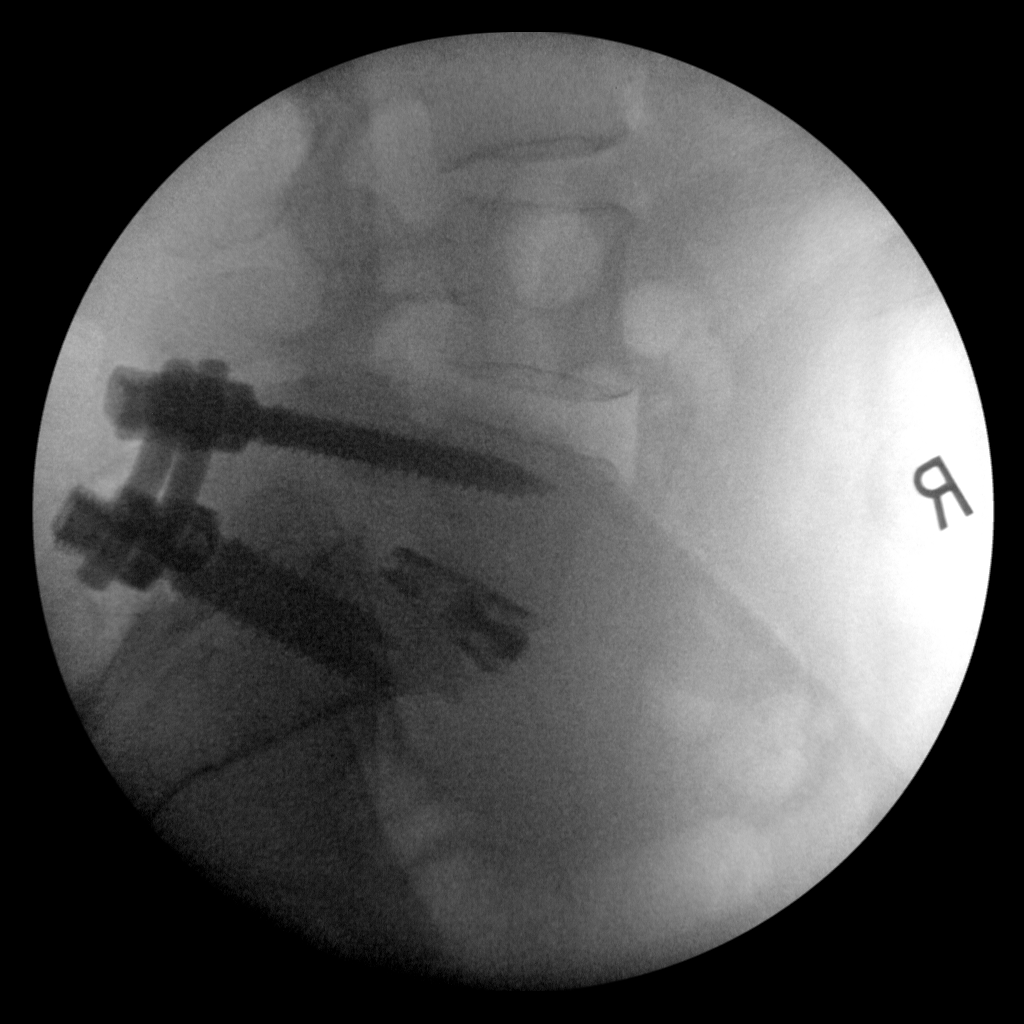
[im 2/4]
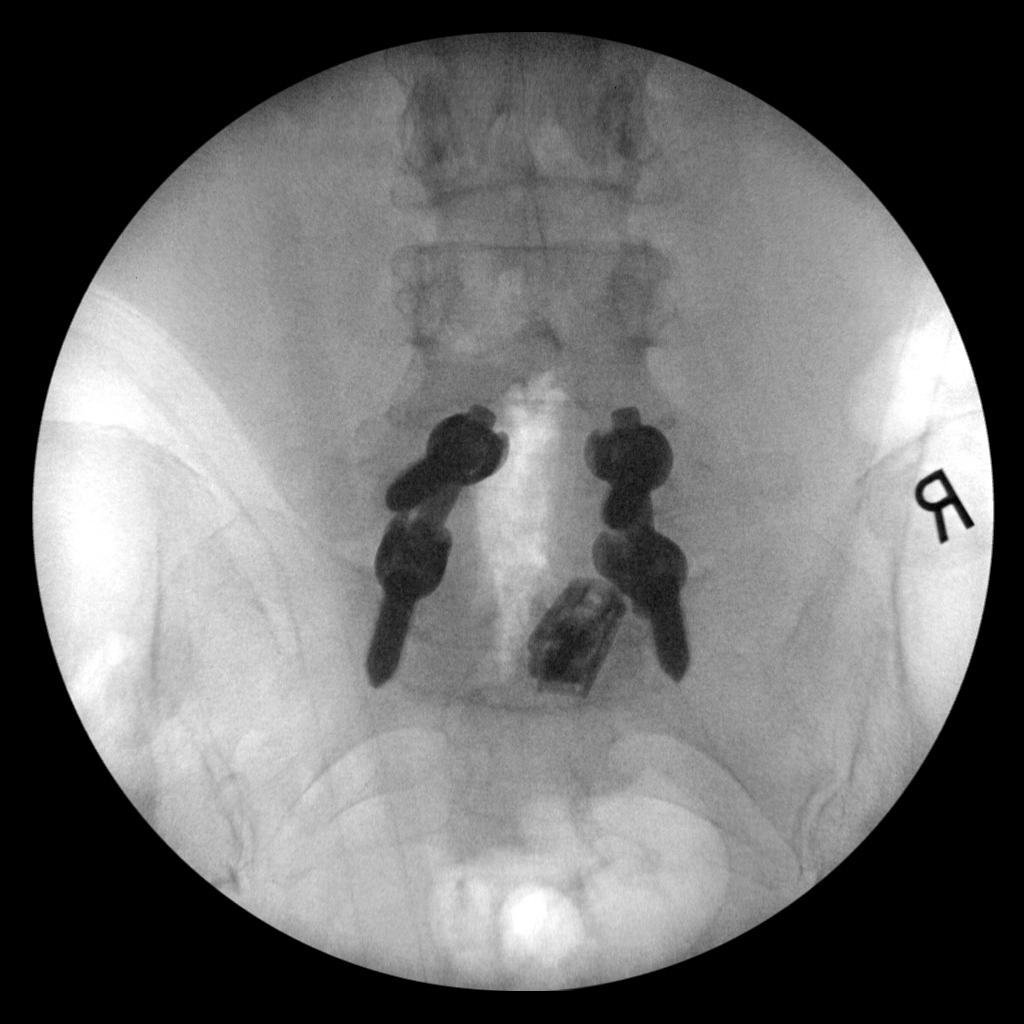
[im 3/4]
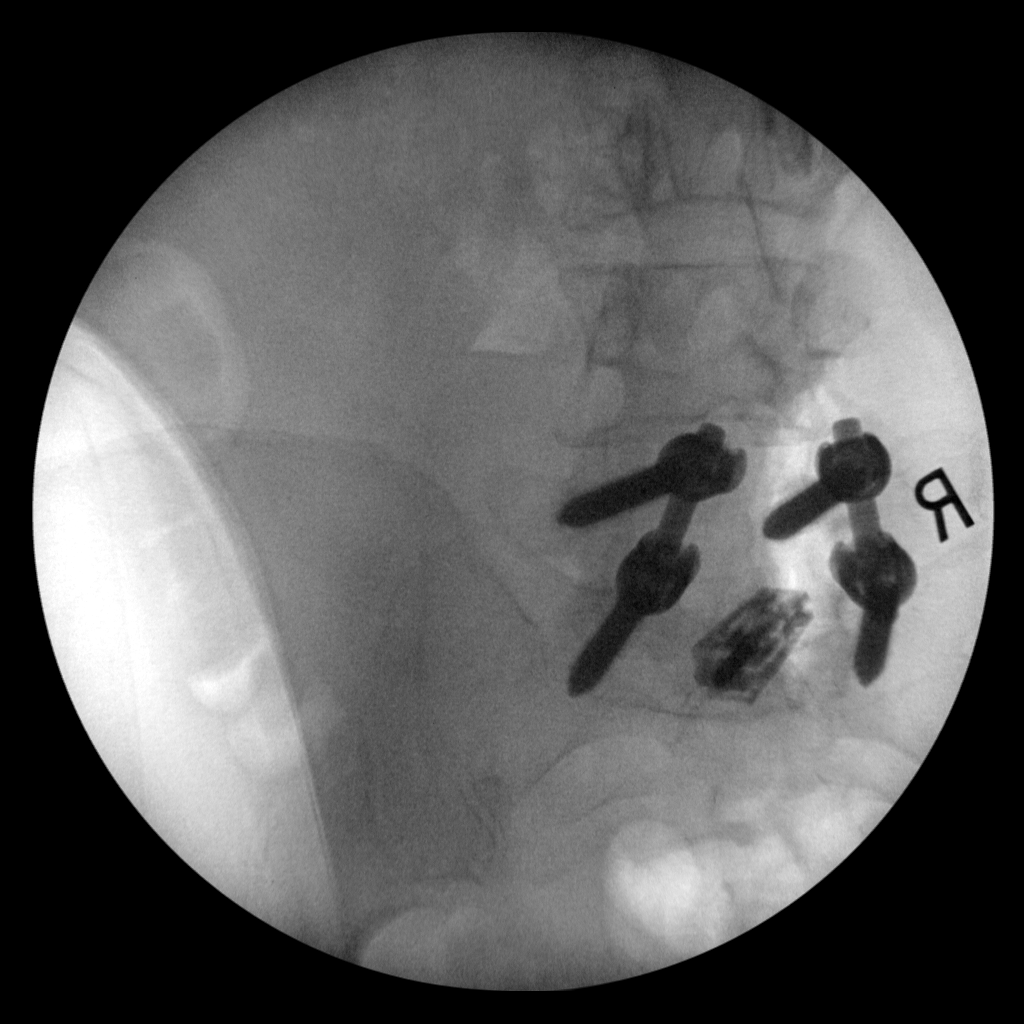
[im 4/4]
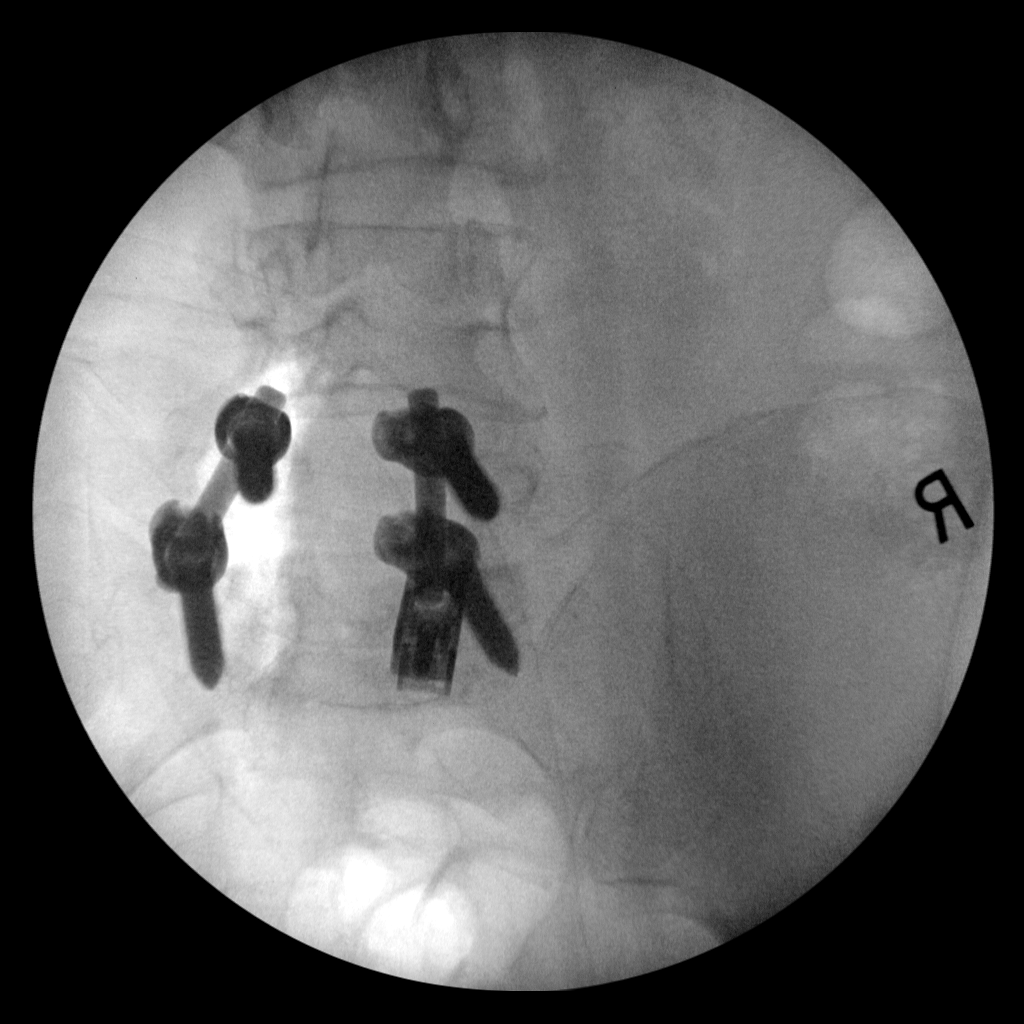

[4 of 4 positions shown; findings below may reference images not displayed]

FINDINGS: Same numbering scheme used for this study as on the previous MRI. 4
intraoperative spot fluoro films are submitted. Interbody device
noted at the L5-S1 disc space. Bilateral pedicle screws are seen at
L4 and L5 with bridging bilateral fusion rods. No evidence for
immediate hardware complication.
IMPRESSION: Intraoperative assessment during L4-5 fusion. No evidence for
immediate hardware complications.

## 2023-07-11 ENCOUNTER — Other Ambulatory Visit (HOSPITAL_COMMUNITY): Payer: Self-pay

## 2023-07-11 ENCOUNTER — Encounter: Payer: Self-pay | Admitting: Gastroenterology

## 2023-07-11 NOTE — Telephone Encounter (Signed)
Ok to prescribe pantoprazole?

## 2023-07-11 NOTE — Telephone Encounter (Signed)
Called pharmacy and they are ordering the medication. Will be in stock tomorrow for patient to pick up.

## 2023-07-11 NOTE — Telephone Encounter (Signed)
Okay to transition back to pantoprazole. Would do 40 mg twice daily. (60/6) Take 30 minutes before the meal. Side effect profile for Aciphex should be very similar to pantoprazole, so would have less concern about the risks of the side effects but understand about the cost. How much more was it?   Would she want to trial Dexilant if it is less costly? Thanks. GM

## 2023-07-12 ENCOUNTER — Other Ambulatory Visit (HOSPITAL_COMMUNITY): Payer: Self-pay

## 2023-07-12 LAB — SURGICAL PATHOLOGY

## 2023-07-12 MED ORDER — PANTOPRAZOLE SODIUM 40 MG PO TBEC: 40.0000 mg | DELAYED_RELEASE_TABLET | Freq: Two times a day (BID) | ORAL | 3 refills | Status: DC

## 2023-07-12 NOTE — Addendum Note (Signed)
Addended by: Loretha Stapler on: 07/12/2023 12:37 PM   Modules accepted: Orders

## 2023-07-12 NOTE — Telephone Encounter (Signed)
Nothing else needed by PA team. Please sign off on this encounter.

## 2023-07-14 ENCOUNTER — Ambulatory Visit: Payer: BC Managed Care – PPO

## 2023-07-14 VITALS — BP 117/79 | HR 83 | Temp 98.2°F | Resp 18 | Ht 65.0 in | Wt 177.0 lb

## 2023-07-14 DIAGNOSIS — D509 Iron deficiency anemia, unspecified: Secondary | ICD-10-CM

## 2023-07-14 IMAGING — MR MR LUMBAR SPINE WO/W CM
4 of 8 series · 23 of 48 positions shown · IV contrast (gadavist)
Comparison: Preoperative MRI 08/20/2021. Operative imaging
09/22/2021.

CLINICAL DATA: Low back pain. Cauda equina syndrome suspected.
Fusion surgery on 09/25/2021. Worsening back pain and leg weakness.

EXAM:
MRI LUMBAR SPINE WITHOUT AND WITH CONTRAST
TECHNIQUE: Multiplanar and multiecho pulse sequences of the lumbar spine were
obtained without and with intravenous contrast.
CONTRAST:  7.5mL GADAVIST GADOBUTROL 1 MMOL/ML IV SOLN

[Series 2: T1 · sagittal · 4.0mm · 0.81mm/px · 5 of 16 slices shown (1 of 2)]
[im 1/16]
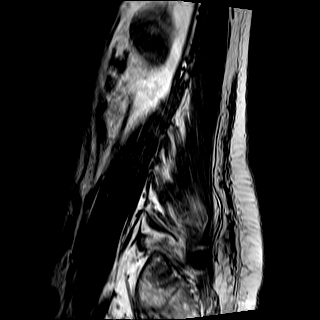
[im 4/16]
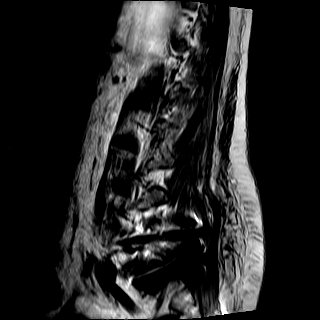
[im 8/16]
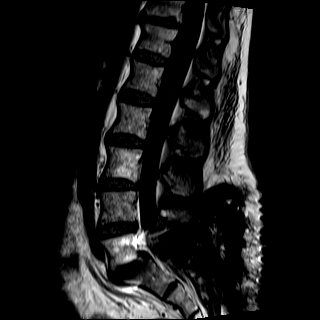
[im 12/16]
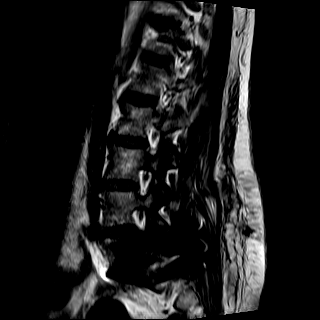
[im 16/16]
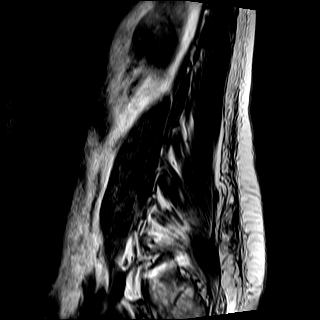

[Series 4: T2 · axial · 5.0mm · 0.39mm/px · z∈[-95,+97]mm · 9 of 36 slices shown (1 of 2)]
[im 1/36]
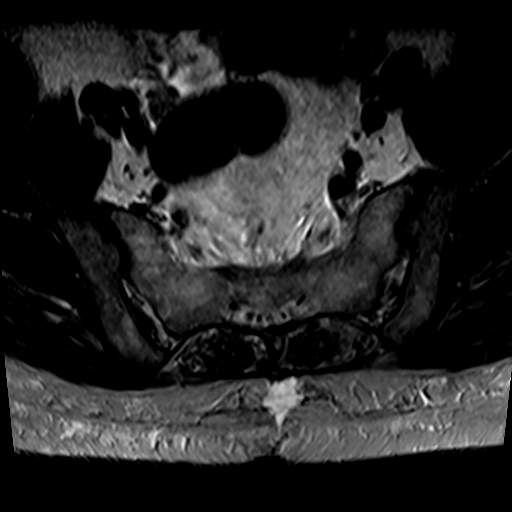
[im 5/36]
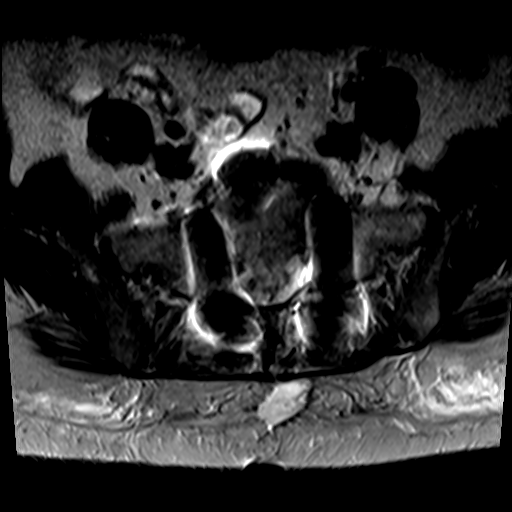
[im 9/36]
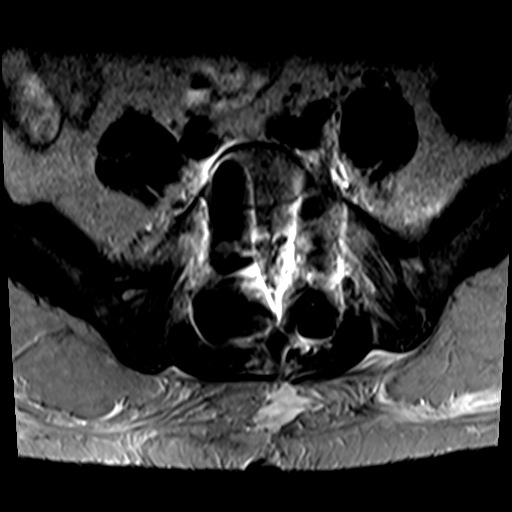
[im 14/36]
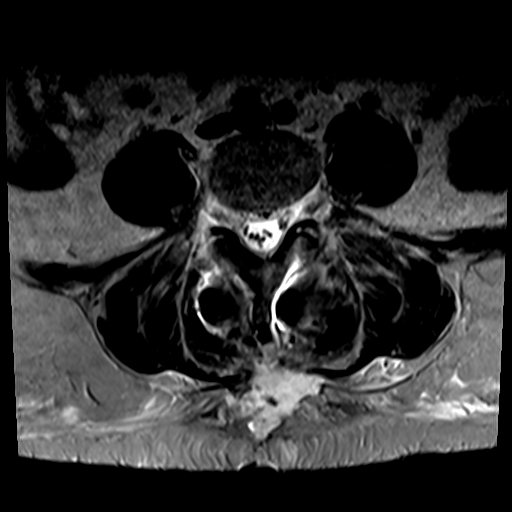
[im 18/36]
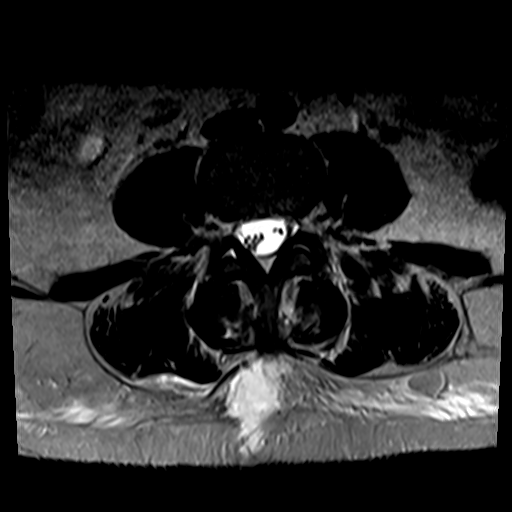
[im 22/36]
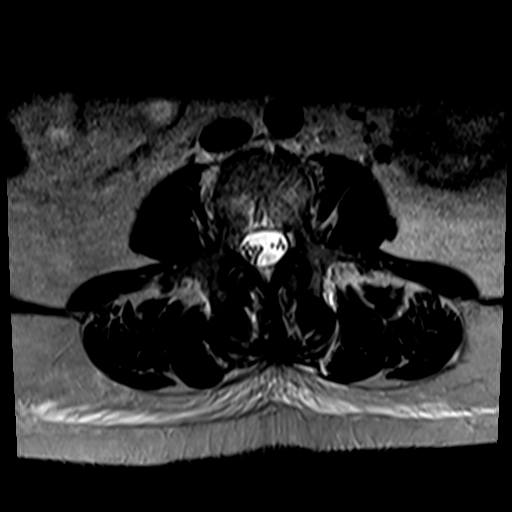
[im 27/36]
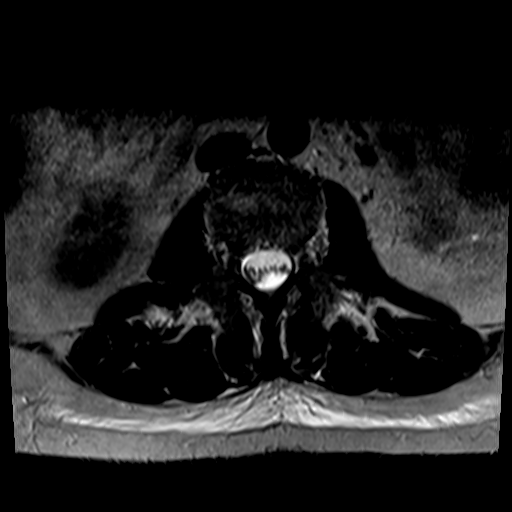
[im 31/36]
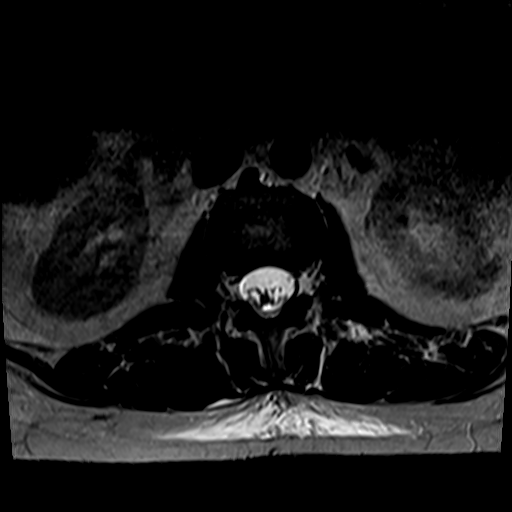
[im 36/36]
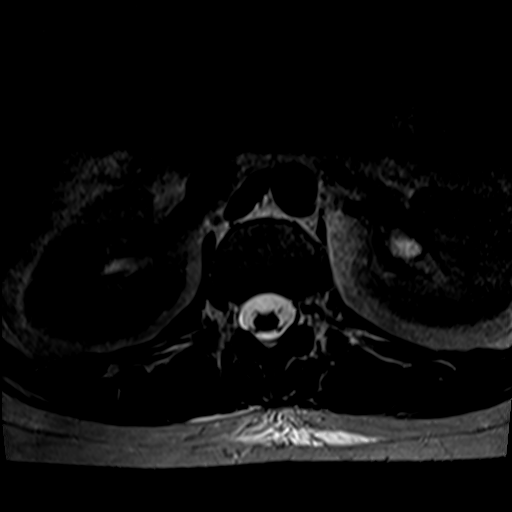

[Series 5: T1 · axial · 5.0mm · 0.39mm/px · z∈[-95,+69]mm · 5 of 36 slices shown (2 of 2)]
[im 1/36]
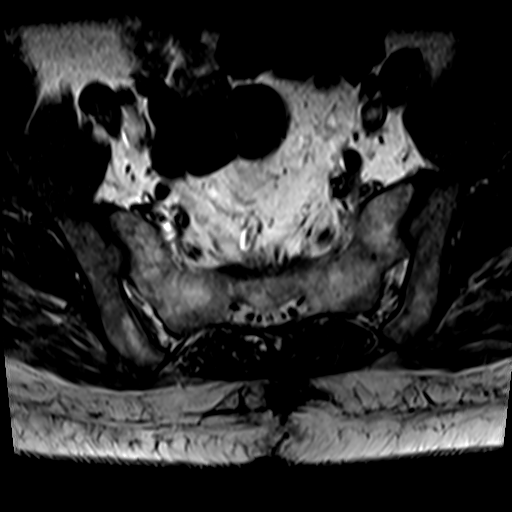
[im 5/36]
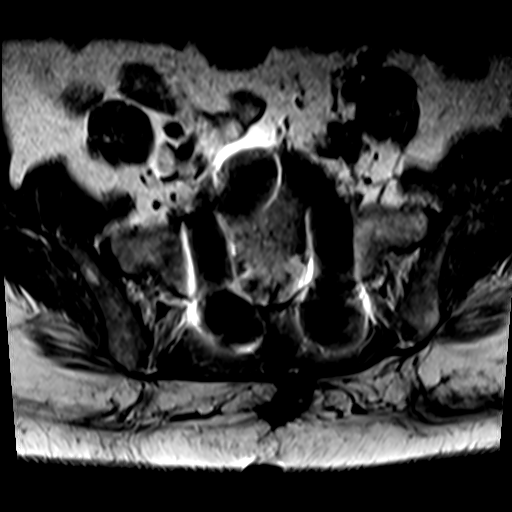
[im 9/36]
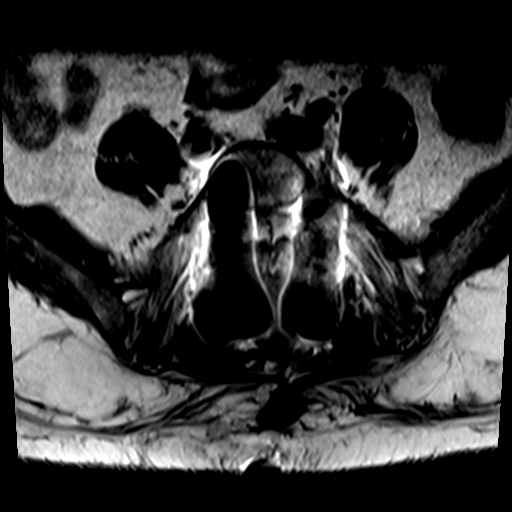
[im 18/36]
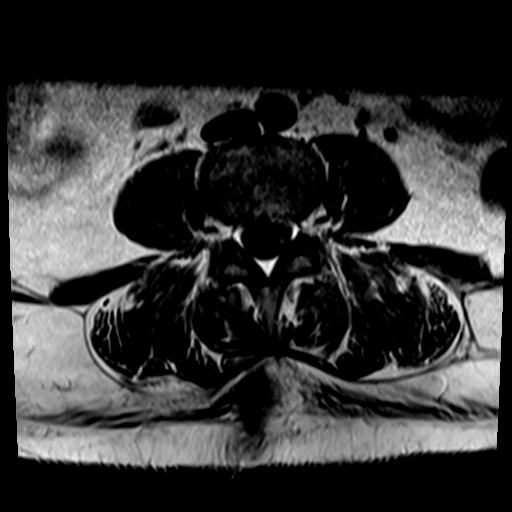
[im 31/36]
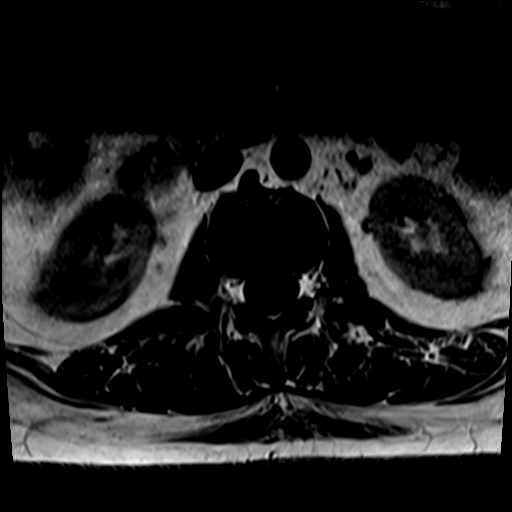

[Series 6: T2 · sagittal · 4.0mm · 0.81mm/px · 4 of 16 slices shown (2 of 2)]
[im 1/16]
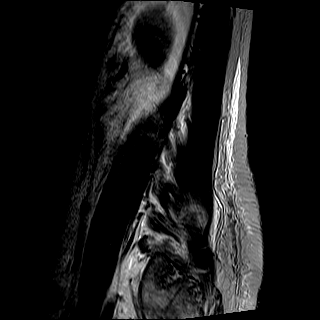
[im 6/16]
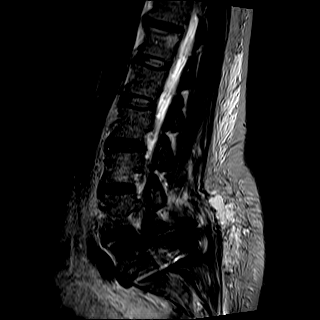
[im 11/16]
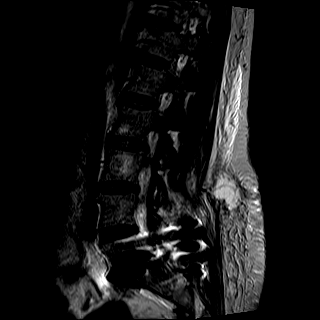
[im 16/16]
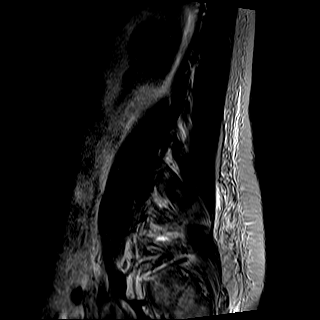

[23 of 48 positions shown; findings below may reference images not displayed]

FINDINGS: Segmentation: 5 lumbar type vertebral bodies as numbered previously.

Alignment:  Fixed anterolisthesis at L5-S1 of 8 mm.

Vertebrae:  No unexpected osseous finding.

Conus medullaris and cauda equina: Conus extends to the L1-2 level.
Conus and cauda equina appear normal.

Paraspinal and other soft tissues: Nonenhancing fluid along the
operative approach within the subcutaneous tissues, not unexpected
in the setting of recent surgery.

Disc levels:

No disc level finding of significance at L3-4 or above. The patient
does have what is probably a subdural fluid collection with simple
T2 bright fluid extending from L2-L5. Maximal thickness is 5.5 mm.
This causes some displacement of the nerve structures within the
thecal sac but does not result invisible neural compression.

At L4-5, there is mild bulging of the disc as seen previously.

At L5-S1, the patient has had posterior decompression, diskectomy
and fusion. Interbody spacer in place. Pedicle screws and posterior
rods. Fixed anterolisthesis measuring 8 mm is unchanged. No evidence
of intraspinal hematoma. Limited detail at this level due to
artifact from fusion hardware.
IMPRESSION: Interval posterior decompression, diskectomy and fusion at L5-S1.
Interbody spacer. Pedicle screws and posterior rods. Fixed
anterolisthesis of 8 mm. Limited detail at the operative level
because of artifact associated with fusion hardware, but there does
not appear to be any unexpected hematoma. Fluid is present in the
subcutaneous fat along the surgical approach, often seen following
recent surgery.

Simple appearing subdural fluid collection posteriorly extending
from L2-L5. Maximal thickness of this is 5.5 mm. This does displace
the nerve roots slightly within the thecal sac but there does not
appear to be any frank nerve compression.

## 2023-07-14 MED ORDER — DIPHENHYDRAMINE HCL 25 MG PO CAPS
25.0000 mg | ORAL_CAPSULE | Freq: Once | ORAL | Status: AC
  Administered 2023-07-14: 25 mg via ORAL
  Filled 2023-07-14: qty 1

## 2023-07-14 MED ORDER — ACETAMINOPHEN 325 MG PO TABS
650.0000 mg | ORAL_TABLET | Freq: Once | ORAL | Status: AC
  Administered 2023-07-14: 650 mg via ORAL
  Filled 2023-07-14: qty 2

## 2023-07-14 MED ORDER — IRON SUCROSE 20 MG/ML IV SOLN
200.0000 mg | Freq: Once | INTRAVENOUS | Status: AC
  Administered 2023-07-14: 200 mg via INTRAVENOUS
  Filled 2023-07-14: qty 10

## 2023-07-14 NOTE — Progress Notes (Addendum)
 Diagnosis: Iron Deficiency Anemia  Provider:  Chilton Greathouse MD  Procedure: IV Push  IV Type: Peripheral, IV Location: R Forearm  Venofer (Iron Sucrose), Dose: 200 mg  Post Infusion IV Care: Observation period completed and Peripheral IV Discontinued  Discharge: Condition: Good, Destination: Home . AVS Declined  Performed by:  Adriana Mccallum, RN

## 2023-07-14 NOTE — Patient Instructions (Signed)
[id number] 

## 2023-07-15 ENCOUNTER — Encounter: Payer: Self-pay | Admitting: Gastroenterology

## 2023-07-28 ENCOUNTER — Ambulatory Visit: Payer: BC Managed Care – PPO

## 2023-07-28 VITALS — BP 136/74 | HR 69 | Temp 97.6°F | Resp 16 | Ht 65.0 in | Wt 176.2 lb

## 2023-07-28 DIAGNOSIS — D509 Iron deficiency anemia, unspecified: Secondary | ICD-10-CM

## 2023-07-28 MED ORDER — ACETAMINOPHEN 325 MG PO TABS
650.0000 mg | ORAL_TABLET | Freq: Once | ORAL | Status: AC
  Administered 2023-07-28: 650 mg via ORAL
  Filled 2023-07-28: qty 2

## 2023-07-28 MED ORDER — IRON SUCROSE 20 MG/ML IV SOLN
200.0000 mg | Freq: Once | INTRAVENOUS | Status: AC
  Administered 2023-07-28: 200 mg via INTRAVENOUS
  Filled 2023-07-28: qty 10

## 2023-07-28 MED ORDER — DIPHENHYDRAMINE HCL 25 MG PO CAPS
25.0000 mg | ORAL_CAPSULE | Freq: Once | ORAL | Status: AC
  Administered 2023-07-28: 25 mg via ORAL
  Filled 2023-07-28: qty 1

## 2023-07-28 NOTE — Progress Notes (Signed)
Diagnosis: Acute Anemia  Provider:  Chilton Greathouse MD  Procedure: IV Push  IV Type: Peripheral, IV Location: R Forearm  Venofer (Iron Sucrose), Dose: 200 mg  Post Infusion IV Care: Observation period completed and Peripheral IV Discontinued  Discharge: Condition: Good, Destination: Home . AVS Declined  Performed by:  Wyvonne Lenz, RN

## 2023-08-04 ENCOUNTER — Ambulatory Visit: Payer: BC Managed Care – PPO

## 2023-08-04 VITALS — BP 116/77 | HR 70 | Temp 98.0°F | Resp 18 | Ht 65.5 in | Wt 175.0 lb

## 2023-08-04 DIAGNOSIS — D509 Iron deficiency anemia, unspecified: Secondary | ICD-10-CM

## 2023-08-04 MED ORDER — ACETAMINOPHEN 325 MG PO TABS
650.0000 mg | ORAL_TABLET | Freq: Once | ORAL | Status: AC
  Administered 2023-08-04: 650 mg via ORAL
  Filled 2023-08-04: qty 2

## 2023-08-04 MED ORDER — IRON SUCROSE 20 MG/ML IV SOLN
200.0000 mg | Freq: Once | INTRAVENOUS | Status: AC
  Administered 2023-08-04: 200 mg via INTRAVENOUS
  Filled 2023-08-04: qty 10

## 2023-08-04 MED ORDER — DIPHENHYDRAMINE HCL 25 MG PO CAPS
25.0000 mg | ORAL_CAPSULE | Freq: Once | ORAL | Status: AC
  Administered 2023-08-04: 25 mg via ORAL
  Filled 2023-08-04: qty 1

## 2023-08-04 NOTE — Progress Notes (Signed)
 Diagnosis: Acute Anemia  Provider:  Chilton Greathouse MD  Procedure: IV Push  IV Type: Peripheral, IV Location: R Antecubital  Venofer (Iron Sucrose), Dose: 200 mg  Post Infusion IV Care: Patient declined observation and Peripheral IV Discontinued  Discharge: Condition: Good, Destination: Home . AVS Declined  Performed by:  Nat Math, RN

## 2023-08-07 ENCOUNTER — Emergency Department: Payer: BC Managed Care – PPO

## 2023-08-07 ENCOUNTER — Emergency Department
Admission: EM | Admit: 2023-08-07 | Discharge: 2023-08-07 | Disposition: A | Discharge status: Home / Self Care | Payer: BC Managed Care – PPO | Attending: Emergency Medicine | Admitting: Emergency Medicine

## 2023-08-07 ENCOUNTER — Encounter: Payer: Self-pay | Admitting: Emergency Medicine

## 2023-08-07 ENCOUNTER — Other Ambulatory Visit: Payer: Self-pay

## 2023-08-07 DIAGNOSIS — G43919 Migraine, unspecified, intractable, without status migrainosus: Secondary | ICD-10-CM | POA: Insufficient documentation

## 2023-08-07 MED ORDER — BUTALBITAL-APAP-CAFFEINE 50-325-40 MG PO TABS: 1.0000 | ORAL_TABLET | Freq: Four times a day (QID) | ORAL | 0 refills | Status: DC | PRN

## 2023-08-07 MED ORDER — BUTALBITAL-APAP-CAFFEINE 50-325-40 MG PO TABS
1.0000 | ORAL_TABLET | Freq: Once | ORAL | Status: AC
  Administered 2023-08-07: 1 via ORAL
  Filled 2023-08-07: qty 1

## 2023-08-07 NOTE — ED Triage Notes (Signed)
Pt via POV from home. Pt c/o migraine since August reports that OTC medication does relieve it but it comes back. Report she has been in touch with PCP but does not take any prescription medication. Pt has an neurology appointment in March. Pt is A&Ox4 and NAD, ambulatory to triage.

## 2023-08-07 NOTE — ED Notes (Signed)
Pt coming to [facility] after CT.

## 2023-08-07 NOTE — Discharge Instructions (Addendum)
Please call neurology to make a follow-up appointment state that you were seen in the ER.  You could try the Fioricet to help with your headaches but symptoms this can cause rebound headache so try to only take as needed but you should return to develop worsening headache, weakness on one side, slurred speech or any other concerns..  Do not take more than 3000 mg of Tylenol in the day.  You can take ibuprofen 600 every 6-8 hours with food to help with pain

## 2023-08-07 NOTE — ED Notes (Signed)
EDP at bedside  

## 2023-08-07 NOTE — ED Provider Notes (Signed)
[facility] [address] Provider Note    None    (approximate)   History   Migraine   HPI  Autumn Rose is a 57 y.o. female who comes in from home for migraines.  Patient reports having migraines since August and being given over-the-counter medications but no significant relief.  Patient has a neurology appointment for March.  Patient reports that every day since August she has had intermittent headaches.  She states that the headaches never go away completely but wax and wane in severity.  She had a CT scan of her sinuses that was reassuring but was on some antibiotics without any relief in symptoms.  She has not had any CT scans of her head.  She denies any vision changes just reports that there is some sensitivity to light.  She denies any numbness, tingling, weakness, changes in her speech.  She states the pain starts in the back of her head and then goes up towards her forehead.  She has been taking over-the-counter medications without any relief.  She has also had some sumatriptan but has not taken it significantly.  She reports having follow-up with neurology in March.  She denies this being the worst headache of her life.  Denies any syncope. She reports that she is been having these headaches intermittently now for some time.  She reports that it did wake her up from sleep at 2 AM but that is pretty typical over the past couple of months that it would wake her up and then gradually gets worse and throughout the day. She reports that she has had migraines previously but usually once a year and so having this frequent is very atypical for her. She denies any smoking history, estrogen use, history of blood clots.  She reports having normal blood pressures.    Physical Exam   Triage Vital Signs: ED Triage Vitals  Encounter Vitals Group     BP 08/07/23 0849 (!) 135/90     Systolic BP Percentile --      Diastolic BP Percentile --      Pulse Rate 08/07/23 0849 89      Resp 08/07/23 0849 17     Temp 08/07/23 0849 98.6 F (37 C)     Temp Source 08/07/23 0849 Oral     SpO2 08/07/23 0849 97 %     Weight 08/07/23 0852 169 lb (76.7 kg)     Height 08/07/23 0852 5' 5.5" (1.664 m)     Head Circumference --      Peak Flow --      Pain Score 08/07/23 0852 9     Pain Loc --      Pain Education --      Exclude from Growth Chart --     Most recent vital signs: Vitals:   08/07/23 0849  BP: (!) 135/90  Pulse: 89  Resp: 17  Temp: 98.6 F (37 C)  SpO2: 97%     General: Awake, no distress.  CV:  Good peripheral perfusion.  Resp:  Normal effort.  Abd:  No distention.  Other:  Cranial nerves are intact equal strength in arms and legs pupils reactive bilaterally.   ED Results / Procedures / Treatments   Labs (all labs ordered are listed, but only abnormal results are displayed) Labs Reviewed - No data to display  RADIOLOGY I have reviewed the CT head personally interpreted no evidence of intracranial hemorrhage  PROCEDURES:  Critical Care performed: No  Procedures   MEDICATIONS ORDERED IN ED: Medications  butalbital-acetaminophen-caffeine (FIORICET) 50-325-40 MG per tablet 1 tablet (has no administration in time range)     IMPRESSION / MDM / ASSESSMENT AND PLAN / ED COURSE  I reviewed the triage vital signs and the nursing notes.   Patient's presentation is most consistent with acute presentation with potential threat to life or bodily function.   Patient comes in with recurrent headaches with a history of migraines but becoming more frequently over the past few months.  CT head ordered to rule out intracranial hemorrhage.  Her symptoms have been ongoing for the past few months but intensity has varied with last spike in intensity less than 12 hours therefore CT imaging is 98% active at ruling out subarachnoid hemorrhage.  It was not maximum in onset or worst headache of her life seems less likely to be subarachnoid.  Discussed with pt CT  angio but given low suspicion for subarachnoid, aneurysm patient agreeable to holding off on CT angio but we did discuss return precautions in regards to this..  No blurred vision to suggest intracranial hypertension.  No risk factors to suggest cavernous venous thrombus.  Patient was given some Fioricet.  We discussed not trying to use this all the diam because it can cause rebound headaches and not to drive on it.  She expressed understanding.  She going to call neurology to try to get a closer follow-up appointment and return to the ER if she develops worsening symptoms or changes in symptoms.     FINAL CLINICAL IMPRESSION(S) / ED DIAGNOSES   Final diagnoses:  Intractable migraine without status migrainosus, unspecified migraine type     Rx / DC Orders   ED Discharge Orders          Ordered    butalbital-acetaminophen-caffeine (FIORICET) 50-325-40 MG tablet  Every 6 hours PRN        08/07/23 1356             Note:  This document was prepared using Dragon voice recognition software and may include unintentional dictation errors.   Concha Se, MD 08/07/23 [phone]

## 2023-08-08 ENCOUNTER — Telehealth: Payer: Self-pay | Admitting: Neurology

## 2023-08-08 NOTE — Telephone Encounter (Signed)
Caller stated she has been having chronic migraines since August. Yesterday on 09/06/22 patient stated she went to emergency room due to migraine being so severe. Emergency room advised patient to see if she could get sooner appointment.

## 2023-08-31 ENCOUNTER — Encounter: Payer: Self-pay | Admitting: Gastroenterology

## 2023-09-05 ENCOUNTER — Other Ambulatory Visit: Payer: Self-pay | Admitting: Neurology

## 2023-09-05 DIAGNOSIS — R519 Headache, unspecified: Secondary | ICD-10-CM

## 2023-09-09 ENCOUNTER — Ambulatory Visit
Admission: RE | Admit: 2023-09-09 | Discharge: 2023-09-09 | Disposition: A | Discharge status: Home / Self Care | Payer: BC Managed Care – PPO | Source: Ambulatory Visit | Attending: Neurology | Admitting: Neurology

## 2023-09-09 DIAGNOSIS — R519 Headache, unspecified: Secondary | ICD-10-CM | POA: Insufficient documentation

## 2023-09-09 MED ORDER — GADOBUTROL 1 MMOL/ML IV SOLN
7.5000 mL | Freq: Once | INTRAVENOUS | Status: AC | PRN
  Administered 2023-09-09: 7.5 mL via INTRAVENOUS

## 2023-10-05 ENCOUNTER — Encounter: Payer: Self-pay | Admitting: Gastroenterology

## 2023-10-07 ENCOUNTER — Encounter: Payer: Self-pay | Admitting: Gastroenterology

## 2023-10-07 ENCOUNTER — Ambulatory Visit: Payer: BC Managed Care – PPO | Admitting: Gastroenterology

## 2023-10-07 VITALS — BP 120/72 | HR 68 | Temp 97.0°F | Resp 13 | Ht 65.5 in | Wt 162.0 lb

## 2023-10-07 DIAGNOSIS — K449 Diaphragmatic hernia without obstruction or gangrene: Secondary | ICD-10-CM | POA: Diagnosis not present

## 2023-10-07 DIAGNOSIS — K2289 Other specified disease of esophagus: Secondary | ICD-10-CM

## 2023-10-07 DIAGNOSIS — K219 Gastro-esophageal reflux disease without esophagitis: Secondary | ICD-10-CM

## 2023-10-07 MED ORDER — SODIUM CHLORIDE 0.9 % IV SOLN: 500.0000 mL | Freq: Once | INTRAVENOUS | Status: DC

## 2023-10-07 NOTE — Patient Instructions (Signed)
 Resume previous diet Continue present medications Follow up in the clinic in 6 months  Handouts/information given for hiatal hernia  YOU HAD AN ENDOSCOPIC PROCEDURE TODAY AT THE [facility]:   Refer to the procedure report that was given to you for any specific questions about what was found during the examination.  If the procedure report does not answer your questions, please call your gastroenterologist to clarify.  If you requested that your care partner not be given the details of your procedure findings, then the procedure report has been included in a sealed envelope for you to review at your convenience later.  YOU SHOULD EXPECT: Some feelings of bloating in the abdomen. Passage of more gas than usual.  Walking can help get rid of the air that was put into your GI tract during the procedure and reduce the bloating. If you had a lower endoscopy (such as a colonoscopy or flexible sigmoidoscopy) you may notice spotting of blood in your stool or on the toilet paper. If you underwent a bowel prep for your procedure, you may not have a normal bowel movement for a few days.  Please Note:  You might notice some irritation and congestion in your nose or some drainage.  This is from the oxygen used during your procedure.  There is no need for concern and it should clear up in a day or so.  SYMPTOMS TO REPORT IMMEDIATELY:  Following upper endoscopy (EGD)  Vomiting of blood or coffee ground material  New chest pain or pain under the shoulder blades  Painful or persistently difficult swallowing  New shortness of breath  Fever of 100F or higher  Black, tarry-looking stools  For urgent or emergent issues, a gastroenterologist can be reached at any hour by calling [phone]. Do not use MyChart messaging for urgent concerns.    DIET:  We do recommend a small meal at first, but then you may proceed to your regular diet.  Drink plenty of fluids but you should avoid alcoholic  beverages for 24 hours.  ACTIVITY:  You should plan to take it easy for the rest of today and you should NOT DRIVE or use heavy machinery until tomorrow (because of the sedation medicines used during the test).    FOLLOW UP: Our staff will call the number listed on your records the next business day following your procedure.  We will call around 7:15- 8:00 am to check on you and address any questions or concerns that you may have regarding the information given to you following your procedure. If we do not reach you, we will leave a message.      SIGNATURES/CONFIDENTIALITY: You and/or your care partner have signed paperwork which will be entered into your electronic medical record.  These signatures attest to the fact that that the information above on your After Visit Summary has been reviewed and is understood.  Full responsibility of the confidentiality of this discharge information lies with you and/or your care-partner.

## 2023-10-07 NOTE — Progress Notes (Signed)
 GASTROENTEROLOGY PROCEDURE H&P NOTE   Primary Care Physician: Burnis Medin, PA-C  HPI: Autumn Rose is a 58 y.o. female who presents for EGD for followup of esophagitis and dysphagia.  Past Medical History:  Diagnosis Date   Anxiety    Complication of anesthesia    Bronchospasm after having tubal ligation in 2011   Lumbar pseudoarthrosis    PONV (postoperative nausea and vomiting)    Past Surgical History:  Procedure Laterality Date   ABDOMINAL EXPOSURE N/A 10/29/2022   Procedure: ABDOMINAL EXPOSURE;  Surgeon: Cephus Shelling, MD;  Location: [facility] OR;  Service: Vascular;  Laterality: N/A;   abdominal plasty  05/2022   tummy tuck   ABLATION     ANTERIOR LUMBAR FUSION N/A 10/29/2022   Procedure: Anterior Lumbar Interbody Fusion - Lumbar Five-Sacral One with cage and revision posterior lateral fusion with replacement of pedicle screws;  Surgeon: Julio Sicks, MD;  Location: [facility] OR;  Service: Neurosurgery;  Laterality: N/A;   APPENDECTOMY  2012   BACK SURGERY     BILATERAL CARPAL TUNNEL RELEASE Bilateral 2021   November 30th & December 14th   BREAST IMPLANT REMOVAL     BREAST SURGERY     FOOT SURGERY Left    plantar fascitis   LAMINECTOMY WITH POSTERIOR LATERAL ARTHRODESIS LEVEL 1 N/A 10/29/2022   Procedure: LAMINECTOMY WITH POSTERIOR LATERAL ARTHRODESIS LEVEL ONE;  Surgeon: Julio Sicks, MD;  Location: [facility] OR;  Service: Neurosurgery;  Laterality: N/A;   LIPOMA EXCISION Left 2005   MENISCUS REPAIR Right 2012   TUBAL LIGATION     WISDOM TOOTH EXTRACTION  1991   Current Outpatient Medications  Medication Sig Dispense Refill   Calcium Carbonate-Vitamin D (CALCIUM 600 + D PO) Take 1 tablet by mouth daily.     fish oil-omega-3 fatty acids 1000 MG capsule Take 1 g by mouth daily.       Multiple Vitamins-Minerals (MULTIVITAMIN WITH MINERALS) tablet Take 1 tablet by mouth daily.       pantoprazole (PROTONIX) 40 MG tablet Take 1 tablet (40 mg total) by mouth 2 (two) times  daily. 90 tablet 3   sertraline (ZOLOFT) 100 MG tablet Take 50 mg by mouth daily.     butalbital-acetaminophen-caffeine (FIORICET) 50-325-40 MG tablet Take 1-2 tablets by mouth every 6 (six) hours as needed for headache. (Patient not taking: Reported on 10/07/2023) 20 tablet 0   cetirizine (ZYRTEC) 10 MG tablet Take 10 mg by mouth daily as needed for allergies.     fluticasone (FLONASE) 50 MCG/ACT nasal spray Place 1 spray into both nostrils daily as needed for allergies or rhinitis.     RABEprazole (ACIPHEX) 20 MG tablet Take 1 tablet (20 mg total) by mouth in the morning and at bedtime. (Patient not taking: Reported on 10/07/2023) 90 tablet 3   Current Facility-Administered Medications  Medication Dose Route Frequency Provider Last Rate Last Admin   0.9 %  sodium chloride infusion  500 mL Intravenous Once Mansouraty, Netty Starring., MD        Current Outpatient Medications:    Calcium Carbonate-Vitamin D (CALCIUM 600 + D PO), Take 1 tablet by mouth daily., Disp: , Rfl:    fish oil-omega-3 fatty acids 1000 MG capsule, Take 1 g by mouth daily.  , Disp: , Rfl:    Multiple Vitamins-Minerals (MULTIVITAMIN WITH MINERALS) tablet, Take 1 tablet by mouth daily.  , Disp: , Rfl:    pantoprazole (PROTONIX) 40 MG tablet, Take 1 tablet (40 mg total)  by mouth 2 (two) times daily., Disp: 90 tablet, Rfl: 3   sertraline (ZOLOFT) 100 MG tablet, Take 50 mg by mouth daily., Disp: , Rfl:    butalbital-acetaminophen-caffeine (FIORICET) 50-325-40 MG tablet, Take 1-2 tablets by mouth every 6 (six) hours as needed for headache. (Patient not taking: Reported on 10/07/2023), Disp: 20 tablet, Rfl: 0   cetirizine (ZYRTEC) 10 MG tablet, Take 10 mg by mouth daily as needed for allergies., Disp: , Rfl:    fluticasone (FLONASE) 50 MCG/ACT nasal spray, Place 1 spray into both nostrils daily as needed for allergies or rhinitis., Disp: , Rfl:    RABEprazole (ACIPHEX) 20 MG tablet, Take 1 tablet (20 mg total) by mouth in the morning and  at bedtime. (Patient not taking: Reported on 10/07/2023), Disp: 90 tablet, Rfl: 3  Current Facility-Administered Medications:    0.9 %  sodium chloride infusion, 500 mL, Intravenous, Once, Mansouraty, Netty Starring., MD Allergies  Allergen Reactions   Oxycodone Itching   Tape Rash   Family History  Problem Relation Age of Onset   Hypertension Mother    Stomach cancer Father    Pancreatic cancer Father    Hyperlipidemia Father    Cancer Father    Non-Hodgkin's lymphoma Father    Epilepsy Sister    Epilepsy Sister    Aneurysm Maternal Grandfather    Lung cancer Paternal Grandmother    Stroke Paternal Grandfather    Rectal cancer Neg Hx    Social History   Socioeconomic History   Marital status: Married    Spouse name: Weyman Croon   Number of children: 1   Years of education: Not on file   Highest education level: Not on file  Occupational History   Occupation: Advertising account planner  Tobacco Use   Smoking status: Former    Current packs/day: 0.00    Types: Cigarettes    Quit date: 05/17/1986    Years since quitting: 37.4   Smokeless tobacco: Never  Vaping Use   Vaping status: Never Used  Substance and Sexual Activity   Alcohol use: Yes    Comment: occ   Drug use: No   Sexual activity: Yes    Birth control/protection: Surgical  Other Topics Concern   Not on file  Social History Narrative   Not on file   Social Drivers of Health   Financial Resource Strain: Low Risk  (09/01/2023)   Received from [facility]   Overall Financial Resource Strain (CARDIA)    Difficulty of Paying Living Expenses: Not very hard  Food Insecurity: No Food Insecurity (09/01/2023)   Received from [facility]   Hunger Vital Sign    Worried About Running Out of Food in the Last Year: Never true    Ran Out of Food in the Last Year: Never true  Transportation Needs: No Transportation Needs (09/01/2023)   Received [facility] -  Transportation    In the past 12 months, has lack of transportation kept you from medical appointments or from getting medications?: No    Lack of Transportation (Non-Medical): No  Physical Activity: Sufficiently Active (05/15/2019)   Received from [facility] visits prior to 10/09/2022., [facility] visits prior to 10/09/2022.   Exercise Vital Sign    Days of Exercise per Week: 7 days    Minutes of Exercise per Session: 60 min  Stress: Not on file  Social Connections: Not on file  Intimate  Partner Violence: Not At Risk (06/10/2023)   Humiliation, Afraid, Rape, and Kick questionnaire    Fear of Current or Ex-Partner: No    Emotionally Abused: No    Physically Abused: No    Sexually Abused: No    Physical Exam: Today's Vitals   10/07/23 1242  BP: 133/87  Pulse: 70  Temp: (!) 97 F (36.1 C)  TempSrc: Temporal  SpO2: 99%  Weight: 162 lb (73.5 kg)  Height: 5' 5.5" (1.664 m)   Body mass index is 26.55 kg/m. GEN: NAD EYE: Sclerae anicteric ENT: MMM CV: Non-tachycardic GI: Soft, NT/ND NEURO:  Alert & Oriented x 3  Lab Results: No results for input(s): "WBC", "HGB", "HCT", "PLT" in the last 72 hours. BMET No results for input(s): "NA", "K", "CL", "CO2", "GLUCOSE", "BUN", "CREATININE", "CALCIUM" in the last 72 hours. LFT No results for input(s): "PROT", "ALBUMIN", "AST", "ALT", "ALKPHOS", "BILITOT", "BILIDIR", "IBILI" in the last 72 hours. PT/INR No results for input(s): "LABPROT", "INR" in the last 72 hours.   Impression / Plan: This is a 58 y.o.female who presents for EGD for followup of esophagitis and dysphagia.  The risks and benefits of endoscopic evaluation/treatment were discussed with the patient and/or family; these include but are not limited to the risk of perforation, infection, bleeding, missed lesions, lack of diagnosis, severe illness requiring hospitalization, as well as anesthesia and sedation related illnesses.  The  patient's history has been reviewed, patient examined, no change in status, and deemed stable for procedure.  The patient and/or family is agreeable to proceed.    Corliss Parish, MD [facility] Office # [phone]

## 2023-10-07 NOTE — Progress Notes (Signed)
 Report to PACU, RN, vss, BBS= Clear.

## 2023-10-07 NOTE — Op Note (Signed)
 [facility] Patient Name: Autumn Rose Procedure Date: 10/07/2023 1:14 PM MRN: [id number] Endoscopist: Corliss Parish , MD, [id number] Age: 58 Referring MD:  Date of Birth: November 11, 1965 Gender: Female Account #: [id number] Procedure:                Upper GI endoscopy Indications:              Dysphagia (improved since last dilation),                            Heartburn, Follow-up of esophagitis Medicines:                Monitored Anesthesia Care Procedure:                Pre-Anesthesia Assessment:                           - Prior to the procedure, a History and Physical                            was performed, and patient medications and                            allergies were reviewed. The patient's tolerance of                            previous anesthesia was also reviewed. The risks                            and benefits of the procedure and the sedation                            options and risks were discussed with the patient.                            All questions were answered, and informed consent                            was obtained. Prior Anticoagulants: The patient has                            taken no anticoagulant or antiplatelet agents. ASA                            Grade Assessment: II - A patient with mild systemic                            disease. After reviewing the risks and benefits,                            the patient was deemed in satisfactory condition to                            undergo the procedure.  After obtaining informed consent, the endoscope was                            passed under direct vision. Throughout the                            procedure, the patient's blood pressure, pulse, and                            oxygen saturations were monitored continuously. The                            Olympus Scope [id number] was introduced through the                            mouth, and advanced  to the second part of duodenum.                            The upper GI endoscopy was accomplished without                            difficulty. The patient tolerated the procedure. Scope In: Scope Out: Findings:                 No gross lesions were noted in the entire esophagus.                           The Z-line was irregular and was found 35 cm from                            the incisors.                           A 1 cm hiatal hernia was present.                           No gross lesions were noted in the entire examined                            stomach.                           No gross lesions were noted in the duodenal bulb,                            in the first portion of the duodenum and in the                            second portion of the duodenum. Complications:            No immediate complications. Estimated Blood Loss:     Estimated blood loss: none. Impression:               - No gross lesions in the entire esophagus.                           -  Z-line irregular, 35 cm from the incisors.                           - 1 cm hiatal hernia.                           - No gross lesions in the entire stomach.                           - No gross lesions in the duodenal bulb, in the                            first portion of the duodenum and in the second                            portion of the duodenum. Recommendation:           - The patient will be observed post-procedure,                            until all discharge criteria are met.                           - Discharge patient to home.                           - Patient has a contact number available for                            emergencies. The signs and symptoms of potential                            delayed complications were discussed with the                            patient. Return to normal activities tomorrow.                            Written discharge instructions were provided to the                             patient.                           - Resume previous diet.                           - Observe patient's clinical course.                           - Repeat upper endoscopy PRN for retreatment if                            dysphagia symptoms recur in future.                           -  Can consider trying to decrease Pantoprazole                            dosing if patient feels comfortable as esophagitis                            has healed well. Aciphex not able to be cost                            effective. Could consider Dexilant versus PCAB in                            future if needed.                           - The findings and recommendations were discussed                            with the patient.                           - The findings and recommendations were discussed                            with the patient's family. Corliss Parish, MD 10/07/2023 1:27:14 PM

## 2023-10-10 ENCOUNTER — Telehealth: Payer: Self-pay

## 2023-10-10 NOTE — Telephone Encounter (Signed)
  Follow up Call-     10/07/2023   12:46 PM 07/05/2023    2:49 PM  Call back number  Post procedure Call Back phone  # [phone]  Permission to leave phone message Yes Yes     Patient questions:  Do you have a fever, pain , or abdominal swelling? No. Pain Score  0 *  Have you tolerated food without any problems? Yes.    Have you been able to return to your normal activities? Yes.    Do you have any questions about your discharge instructions: Diet   No. Medications  No. Follow up visit  No.  Do you have questions or concerns about your Care? No.  Actions: * If pain score is 4 or above: No action needed, pain <4.

## 2023-10-11 ENCOUNTER — Other Ambulatory Visit: Payer: Self-pay | Admitting: Oncology

## 2023-10-11 DIAGNOSIS — D509 Iron deficiency anemia, unspecified: Secondary | ICD-10-CM

## 2023-10-12 ENCOUNTER — Inpatient Hospital Stay: Payer: BC Managed Care – PPO | Attending: Oncology

## 2023-10-12 ENCOUNTER — Inpatient Hospital Stay (HOSPITAL_BASED_OUTPATIENT_CLINIC_OR_DEPARTMENT_OTHER): Payer: BC Managed Care – PPO | Admitting: Oncology

## 2023-10-12 ENCOUNTER — Encounter: Payer: Self-pay | Admitting: Oncology

## 2023-10-12 VITALS — BP 118/88 | HR 73 | Temp 98.0°F | Resp 16 | Wt 170.6 lb

## 2023-10-12 DIAGNOSIS — D509 Iron deficiency anemia, unspecified: Secondary | ICD-10-CM | POA: Diagnosis present

## 2023-10-12 DIAGNOSIS — Z79899 Other long term (current) drug therapy: Secondary | ICD-10-CM | POA: Diagnosis not present

## 2023-10-12 DIAGNOSIS — K219 Gastro-esophageal reflux disease without esophagitis: Secondary | ICD-10-CM | POA: Diagnosis not present

## 2023-10-12 LAB — CBC WITH DIFFERENTIAL (CANCER CENTER ONLY)
Abs Immature Granulocytes: 0 10*3/uL (ref 0.00–0.07)
Basophils Absolute: 0 10*3/uL (ref 0.0–0.1)
Basophils Relative: 1 %
Eosinophils Absolute: 0.1 10*3/uL (ref 0.0–0.5)
Eosinophils Relative: 1 %
HCT: 41.9 % (ref 36.0–46.0)
Hemoglobin: 14.3 g/dL (ref 12.0–15.0)
Immature Granulocytes: 0 %
Lymphocytes Relative: 35 %
Lymphs Abs: 1.7 10*3/uL (ref 0.7–4.0)
MCH: 28.9 pg (ref 26.0–34.0)
MCHC: 34.1 g/dL (ref 30.0–36.0)
MCV: 84.8 fL (ref 80.0–100.0)
Monocytes Absolute: 0.5 10*3/uL (ref 0.1–1.0)
Monocytes Relative: 10 %
Neutro Abs: 2.6 10*3/uL (ref 1.7–7.7)
Neutrophils Relative %: 53 %
Platelet Count: 276 10*3/uL (ref 150–400)
RBC: 4.94 MIL/uL (ref 3.87–5.11)
RDW: 14.1 % (ref 11.5–15.5)
WBC Count: 4.9 10*3/uL (ref 4.0–10.5)
nRBC: 0 % (ref 0.0–0.2)

## 2023-10-12 LAB — FERRITIN: Ferritin: 194 ng/mL (ref 11–307)

## 2023-10-12 LAB — IRON AND IRON BINDING CAPACITY (CC-WL,HP ONLY)
Iron: 98 ug/dL (ref 28–170)
Saturation Ratios: 28 % (ref 10.4–31.8)
TIBC: 351 ug/dL (ref 250–450)
UIBC: 253 ug/dL (ref 148–442)

## 2023-10-12 NOTE — Assessment & Plan Note (Signed)
 Long-standing history of acid reflux. Recent change in medication to Dexilant has improved symptoms. No difficulty swallowing except during flare-ups. Previous esophageal dilation performed few years ago. No history of H. Pylori infection or Barrett's esophagus.  Recent upper endoscopy on 10/07/2023 showed no concerning findings. Continue current medication regimen for symptom control.  - Continue pantoprazole twice daily

## 2023-10-12 NOTE — Assessment & Plan Note (Signed)
 Chronic history of low iron.  History of constipation and occasional hard stools with possible minor bleeding. No history of abnormal menstrual bleeding.  -Previous oral iron supplementation caused headaches.  On her initial consultation with [place] on 06/10/2023, labs showed hemoglobin 12.9, hematocrit 39.5.  MCV still low at 78.2.  White count and platelet count normal.  CMP unremarkable.  Iron studies within normal limits.  Ferritin came back low at 7.  LDH normal.  Since ferritin remained low, we proceeded with IV iron supplementation using Venofer weekly x 5 doses.    Repeat labs on 10/12/2023 showed much improved hemoglobin of 14.3, MCV 84.8.  Iron studies showed no evidence of iron deficiency.  No indication for IV iron currently.  We will avoid oral iron because of past history of headaches with iron supplements.  RTC in 6 months for follow-up with repeat labs including iron studies.

## 2023-10-12 NOTE — Progress Notes (Signed)
 [facility]  HEMATOLOGY CLINIC PROGRESS NOTE  PATIENT NAME: Autumn Rose   MR#: [id number] DOB: Jun 30, 1966  Patient Care Team: Autumn Rose as PCP - General (Family Medicine) Zehr, Princella Pellegrini, PA-C as Physician Assistant (Gastroenterology)  Date of visit: 10/12/2023   ASSESSMENT & PLAN:   Autumn Rose is a 58 y.o. lady with a past medical history of GERD, was referred to our service in November 2024 for evaluation of iron deficiency anemia.    Microcytic anemia Chronic history of low iron.  History of constipation and occasional hard stools with possible minor bleeding. No history of abnormal menstrual bleeding.  -Previous oral iron supplementation caused headaches.  On her initial consultation with [place] on 06/10/2023, labs showed hemoglobin 12.9, hematocrit 39.5.  MCV still low at 78.2.  White count and platelet count normal.  CMP unremarkable.  Iron studies within normal limits.  Ferritin came back low at 7.  LDH normal.  Since ferritin remained low, we proceeded with IV iron supplementation using Venofer weekly x 5 doses.    Repeat labs on 10/12/2023 showed much improved hemoglobin of 14.3, MCV 84.8.  Iron studies showed no evidence of iron deficiency.  No indication for IV iron currently.  We will avoid oral iron because of past history of headaches with iron supplements.  RTC in 6 months for follow-up with repeat labs including iron studies.  Gastroesophageal reflux disease Long-standing history of acid reflux. Recent change in medication to Dexilant has improved symptoms. No difficulty swallowing except during flare-ups. Previous esophageal dilation performed few years ago. No history of H. Pylori infection or Barrett's esophagus.  Recent upper endoscopy on 10/07/2023 showed no concerning findings. Continue current medication regimen for symptom control.  - Continue pantoprazole twice daily   I spent a total of 20 minutes during this  encounter with the patient including review of chart and various tests results, discussions about plan of care and coordination of care plan.  I reviewed lab results and outside records for this visit and discussed relevant results with the patient. Diagnosis, plan of care and treatment options were also discussed in detail with the patient. Opportunity provided to ask questions and answers provided to her apparent satisfaction. Provided instructions to call our clinic with any problems, questions or concerns prior to return visit. I recommended to continue follow-up with PCP and sub-specialists. She verbalized understanding and agreed with the plan. No barriers to learning was detected.  Autumn Crutch, MD  10/12/2023 12:36 PM  [facility] - A DE[facility] [address] [place] [address] Dept: [phone] Dept Fax: [phone]   CHIEF COMPLAINT/ REASON FOR VISIT:  Follow-up for iron deficiency anemia, presumed GI blood loss  INTERVAL HISTORY:  Discussed the use of AI scribe software for clinical note transcription with the patient, who gave verbal consent to proceed.   Patient returns for follow up.   She reports feeling 'about the same' despite a recent increase in her medication dosage. She has not noticed any significant changes in her symptoms.  Previously, the patient had a low hemoglobin level of 12.9 and small red blood cells. However, recent labs show an improvement in her hemoglobin to 14.3 and an increase in the size of her red blood cells to 84.8. The patient's white count, platelet count, and other blood counts are reported as normal.  The patient has a history of headaches  associated with oral iron supplementation.  The patient also reports occasional bright red blood in her stool, which she attributes to possible hemorrhoids. She has seen a gastroenterologist and recently underwent an upper  endoscopy, which did not reveal any concerning findings. She is currently on Dexilant and pantoprazole for acid reflux, which she still experiences occasionally.  SUMMARY OF HEMATOLOGIC HISTORY:  She recently established with gastroenterology department for GERD.  On 05/11/2023, labs showed ferritin of 8.7, iron saturation 8%, iron decreased at 41, iron binding capacity increased at 490.  B12 and folate were within normal limits.  Apparently she had labs at an outside facility which showed hemoglobin of 11 with microcytosis.  Patient could not tolerate oral iron previously.  Given iron deficiency anemia, she was referred to [place] for further evaluation and consideration of IV iron.   She recently started Dexilant, which has improved her symptoms. She has had endoscopies in the past, with the only intervention being an esophageal dilation. She denies any history of H. Pylori infection or Barrett's esophagus. When her reflux flares up, she experiences difficulty swallowing, but this is not a constant issue.   She has a history of constipation and occasionally notices blood in her stool, which she attributes to straining. She denies any other sources of blood loss, such as nosebleeds or gum bleeds. She has had a colonoscopy within the past five years, which was normal.   She has tried oral iron supplementation in the past, but it caused severe headaches. She also reports a constant dry mouth, which she believes may be a side effect of her Zoloft medication. She denies any unusual food cravings.   She denies recent chest pain on exertion, shortness of breath on minimal exertion, pre-syncopal episodes, or palpitations.   She had no prior history or diagnosis of cancer. Her age appropriate screening programs are up-to-date.  Chronic history of low iron. History of constipation and occasional hard stools with possible minor bleeding. No history of abnormal menstrual bleeding.   Previous oral iron  supplementation caused headaches.  On her initial consultation with [place] on 06/10/2023, labs showed hemoglobin 12.9, hematocrit 39.5.  MCV still low at 78.2.  White count and platelet count normal.  CMP unremarkable.  Iron studies within normal limits.  Ferritin came back low at 7.  LDH normal.  Since ferritin remained low, we proceeded with IV iron supplementation using Venofer weekly x 5 doses.    Repeat labs on 10/12/2023 showed much improved hemoglobin of 14.3, MCV 84.8.  Iron studies showed no evidence of iron deficiency.  I have reviewed the past medical history, past surgical history, social history and family history with the patient and they are unchanged from previous note.  ALLERGIES: She is allergic to oxycodone and tape.  MEDICATIONS:  Current Outpatient Medications  Medication Sig Dispense Refill   Calcium Carbonate-Vitamin D (CALCIUM 600 + D PO) Take 1 tablet by mouth daily.     cetirizine (ZYRTEC) 10 MG tablet Take 10 mg by mouth daily as needed for allergies.     fish oil-omega-3 fatty acids 1000 MG capsule Take 1 g by mouth daily.       fluticasone (FLONASE) 50 MCG/ACT nasal spray Place 1 spray into both nostrils daily as needed for allergies or rhinitis.     Multiple Vitamins-Minerals (MULTIVITAMIN WITH MINERALS) tablet Take 1 tablet by mouth daily.       pantoprazole (PROTONIX) 40 MG tablet Take 1 tablet (40 mg total) by mouth 2 (two)  times daily. 90 tablet 3   sertraline (ZOLOFT) 100 MG tablet Take 50 mg by mouth daily.     No current facility-administered medications for this visit.     REVIEW OF SYSTEMS:    ROS  All other pertinent systems were reviewed with the patient and are negative.  PHYSICAL EXAMINATION:   Onc Performance Status - 10/12/23 1156       ECOG Perf Status   ECOG Perf Status Fully active, able to carry on all pre-disease performance without restriction      KPS SCALE   KPS % SCORE Normal, no compliants, no evidence of disease              Vitals:   10/12/23 1136  BP: 118/88  Pulse: 73  Resp: 16  Temp: 98 F (36.7 C)  SpO2: 100%   Filed Weights   10/12/23 1136  Weight: 170 lb 9.6 oz (77.4 kg)    Physical Exam Constitutional:      General: She is not in acute distress.    Appearance: Normal appearance.  HENT:     Head: Normocephalic and atraumatic.  Eyes:     General: No scleral icterus.    Conjunctiva/sclera: Conjunctivae normal.  Cardiovascular:     Rate and Rhythm: Normal rate and regular rhythm.     Heart sounds: Normal heart sounds.  Pulmonary:     Effort: Pulmonary effort is normal.     Breath sounds: Normal breath sounds.  Abdominal:     General: There is no distension.  Musculoskeletal:     Right lower leg: No edema.     Left lower leg: No edema.  Neurological:     General: No focal deficit present.     Mental Status: She is alert and oriented to person, place, and time.  Psychiatric:        Mood and Affect: Mood normal.        Behavior: Behavior normal.        Thought Content: Thought content normal.     LABORATORY DATA:   I have reviewed the data as listed.  Results for orders placed or performed in visit on 10/12/23  Iron and Iron Binding Capacity ([facility] only)  Result Value Ref Range   Iron 98 28 - 170 ug/dL   TIBC 578 469 - 629 ug/dL   Saturation Ratios 28 10.4 - 31.8 %   UIBC 253 148 - 442 ug/dL  CBC with Differential (Cancer Center Only)  Result Value Ref Range   WBC Count 4.9 4.0 - 10.5 K/uL   RBC 4.94 3.87 - 5.11 MIL/uL   Hemoglobin 14.3 12.0 - 15.0 g/dL   HCT 52.8 41.3 - 24.4 %   MCV 84.8 80.0 - 100.0 fL   MCH 28.9 26.0 - 34.0 pg   MCHC 34.1 30.0 - 36.0 g/dL   RDW 01.0 27.2 - 53.6 %   Platelet Count 276 150 - 400 K/uL   nRBC 0.0 0.0 - 0.2 %   Neutrophils Relative % 53 %   Neutro Abs 2.6 1.7 - 7.7 K/uL   Lymphocytes Relative 35 %   Lymphs Abs 1.7 0.7 - 4.0 K/uL   Monocytes Relative 10 %   Monocytes Absolute 0.5 0.1 - 1.0 K/uL   Eosinophils Relative 1 %    Eosinophils Absolute 0.1 0.0 - 0.5 K/uL   Basophils Relative 1 %   Basophils Absolute 0.0 0.0 - 0.1 K/uL   Immature Granulocytes 0 %   Abs Immature  Granulocytes 0.00 0.00 - 0.07 K/uL    RADIOGRAPHIC STUDIES:  No recent pertinent imaging studies available to review.  Orders Placed This Encounter  Procedures   CBC with Differential (Cancer Center Only)    Standing Status:   Future    Expected Date:   04/13/2024    Expiration Date:   10/11/2024   Iron and Iron Binding Capacity ([facility] only)    Standing Status:   Future    Expected Date:   04/13/2024    Expiration Date:   10/11/2024   Ferritin    Standing Status:   Future    Expected Date:   04/13/2024    Expiration Date:   10/11/2024     Future Appointments  Date Time Provider Department Center  04/13/2024  8:00 AM [facility] None  04/13/2024  8:30 AM Daleah Coulson, Archie Patten, MD [facility] None     This document was completed utilizing speech recognition software. Grammatical errors, random word insertions, pronoun errors, and incomplete sentences are an occasional consequence of this system due to software limitations, ambient noise, and hardware issues. Any formal questions or concerns about the content, text or information contained within the body of this dictation should be directly addressed to the provider for clarification.

## 2023-10-27 ENCOUNTER — Other Ambulatory Visit: Payer: Self-pay | Admitting: Gastroenterology

## 2023-11-07 ENCOUNTER — Ambulatory Visit: Payer: BC Managed Care – PPO | Admitting: Neurology

## 2023-11-16 ENCOUNTER — Other Ambulatory Visit: Payer: Self-pay

## 2023-11-16 ENCOUNTER — Emergency Department
Admission: EM | Admit: 2023-11-16 | Discharge: 2023-11-17 | Disposition: A | Discharge status: Home / Self Care | Attending: Emergency Medicine | Admitting: Emergency Medicine

## 2023-11-16 ENCOUNTER — Emergency Department

## 2023-11-16 DIAGNOSIS — D72829 Elevated white blood cell count, unspecified: Secondary | ICD-10-CM | POA: Insufficient documentation

## 2023-11-16 DIAGNOSIS — N3090 Cystitis, unspecified without hematuria: Secondary | ICD-10-CM | POA: Insufficient documentation

## 2023-11-16 DIAGNOSIS — R339 Retention of urine, unspecified: Secondary | ICD-10-CM | POA: Diagnosis present

## 2023-11-16 LAB — COMPREHENSIVE METABOLIC PANEL WITH GFR
ALT: 59 U/L — ABNORMAL HIGH (ref 0–44)
AST: 36 U/L (ref 15–41)
Albumin: 4.5 g/dL (ref 3.5–5.0)
Alkaline Phosphatase: 88 U/L (ref 38–126)
Anion gap: 9 (ref 5–15)
BUN: 18 mg/dL (ref 6–20)
CO2: 26 mmol/L (ref 22–32)
Calcium: 9.9 mg/dL (ref 8.9–10.3)
Chloride: 101 mmol/L (ref 98–111)
Creatinine, Ser: 0.72 mg/dL (ref 0.44–1.00)
GFR, Estimated: >60 mL/min (ref >60)
Glucose, Bld: 109 mg/dL — ABNORMAL HIGH (ref 70–99)
Potassium: 3.7 mmol/L (ref 3.5–5.1)
Sodium: 136 mmol/L (ref 135–145)
Total Bilirubin: 0.9 mg/dL (ref 0.0–1.2)
Total Protein: 7.6 g/dL (ref 6.5–8.1)

## 2023-11-16 LAB — CBC
HCT: 40.9 % (ref 36.0–46.0)
Hemoglobin: 14.4 g/dL (ref 12.0–15.0)
MCH: 29.8 pg (ref 26.0–34.0)
MCHC: 35.2 g/dL (ref 30.0–36.0)
MCV: 84.5 fL (ref 80.0–100.0)
Platelets: 263 10*3/uL (ref 150–400)
RBC: 4.84 MIL/uL (ref 3.87–5.11)
RDW: 12.7 % (ref 11.5–15.5)
WBC: 12.5 10*3/uL — ABNORMAL HIGH (ref 4.0–10.5)
nRBC: 0 % (ref 0.0–0.2)

## 2023-11-16 LAB — URINALYSIS, ROUTINE W REFLEX MICROSCOPIC
Bilirubin Urine: NEGATIVE
Glucose, UA: NEGATIVE mg/dL
Ketones, ur: NEGATIVE mg/dL
Nitrite: NEGATIVE
Protein, ur: 100 mg/dL — AB
RBC / HPF: >50 RBC/hpf (ref 0–5)
Specific Gravity, Urine: 1.012 (ref 1.005–1.030)
WBC, UA: >50 WBC/hpf (ref 0–5)
pH: 5 (ref 5.0–8.0)

## 2023-11-16 LAB — PREGNANCY, URINE: Preg Test, Ur: NEGATIVE

## 2023-11-16 MED ORDER — IOHEXOL 300 MG/ML  SOLN
100.0000 mL | Freq: Once | INTRAMUSCULAR | Status: AC | PRN
  Administered 2023-11-16: 100 mL via INTRAVENOUS

## 2023-11-16 MED ORDER — ONDANSETRON 4 MG PO TBDP
4.0000 mg | ORAL_TABLET | Freq: Once | ORAL | Status: AC
  Administered 2023-11-16: 4 mg via ORAL
  Filled 2023-11-16: qty 1

## 2023-11-16 MED ORDER — ACETAMINOPHEN 500 MG PO TABS
1000.0000 mg | ORAL_TABLET | Freq: Once | ORAL | Status: AC
  Administered 2023-11-16: 1000 mg via ORAL
  Filled 2023-11-16: qty 2

## 2023-11-16 NOTE — ED Provider Notes (Signed)
 [facility] Emergency Department Provider Note     Event Date/Time   First MD Initiated Contact with Patient 11/16/23 1750     (approximate)   History   Urinary Retention   HPI  Autumn Rose is a 58 y.o. female presents to the ED for evaluation of urinary retention x months and noted hematuria that started today with suprapubic pressure..  She denies abdominal pain.  Endorses oliguria, urinary urgency and nausea.  Patient reports she has an appointment with urology in May but has not been able to make it a sooner appointment.  Denies fever and chills    Physical Exam   Triage Vital Signs: ED Triage Vitals  Encounter Vitals Group     BP 11/16/23 1715 (!) 157/91     Systolic BP Percentile --      Diastolic BP Percentile --      Pulse Rate 11/16/23 1715 100     Resp 11/16/23 1715 16     Temp 11/16/23 1715 98.3 F (36.8 C)     Temp Source 11/16/23 1715 Oral     SpO2 11/16/23 1715 100 %     Weight 11/16/23 1716 168 lb (76.2 kg)     Height 11/16/23 1716 5\' 5"  (1.651 m)     Head Circumference --      Peak Flow --      Pain Score 11/16/23 1716 8     Pain Loc --      Pain Education --      Exclude from Growth Chart --     Most recent vital signs: Vitals:   11/16/23 2130 11/17/23 0039  BP: 107/80 (!) 114/90  Pulse: 71 69  Resp: 18 18  Temp: 98.6 F (37 C) 98.6 F (37 C)  SpO2: 100% 100%    General Awake, no distress.  HEENT NCAT.  CV:  Good peripheral perfusion.  RESP:  Normal effort.  ABD:  No distention.  Soft nontender.  Tenderness of suprapubic region.  Negative CVA tenderness bilaterally.  ED Results / Procedures / Treatments   Labs (all labs ordered are listed, but only abnormal results are displayed) Labs Reviewed  URINALYSIS, ROUTINE W REFLEX MICROSCOPIC - Abnormal; Notable for the following components:      Result Value   Color, Urine YELLOW (*)    APPearance CLOUDY (*)    Hgb urine dipstick LARGE (*)    Protein, ur  100 (*)    Leukocytes,Ua LARGE (*)    Bacteria, UA RARE (*)    All other components within normal limits  CBC - Abnormal; Notable for the following components:   WBC 12.5 (*)    All other components within normal limits  COMPREHENSIVE METABOLIC PANEL WITH GFR - Abnormal; Notable for the following components:   Glucose, Bld 109 (*)    ALT 59 (*)    All other components within normal limits  URINE CULTURE  PREGNANCY, URINE    RADIOLOGY  I personally viewed and evaluated these images as part of my medical decision making, as well as reviewing the written report by the radiologist.  ED Provider Interpretation:  Mildly thick-walled urinary bladder with mucosal enhancement,  appearance suspicious for cystitis. Otherwise no CT evidence for  acute intra-abdominal or pelvic abnormality    No results found.  PROCEDURES:  Critical Care performed: No  Procedures   MEDICATIONS ORDERED IN ED: Medications  ondansetron (ZOFRAN-ODT) disintegrating tablet 4 mg (4 mg Oral Given 11/16/23 1934)  acetaminophen (TYLENOL) tablet 1,000 mg (1,000 mg Oral Given 11/16/23 1934)  iohexol (OMNIPAQUE) 300 MG/ML solution 100 mL (100 mLs Intravenous Contrast Given 11/16/23 2019)  cephALEXin (KEFLEX) capsule 500 mg (500 mg Oral Given 11/17/23 0034)     IMPRESSION / MDM / ASSESSMENT AND PLAN / ED COURSE  I reviewed the triage vital signs and the nursing notes.                               58 y.o. female presents to the emergency department for evaluation and treatment of chronic urinary retention. See HPI for further details.   Differential diagnosis includes, but is not limited to bladder carcinoma, UTI, nephrolithiasis  Patient's presentation is most consistent with acute complicated illness / injury requiring diagnostic workup.  Patient report improvement in pain.  She believes she possibly passed a kidney stone as she is able to void better while being in the ED. CT abdomen pelvis reveals cystitis  which is consistent with her urinalysis showing large leukocytes and bacteria.  Will send a urine culture.  She is in stable condition for discharge home and outpatient follow-up with urology.  I encouraged her to make a sooner appointment in May 7.  Antibiotics sent to pharmacy.  ED return precautions discussed thoroughly.  FINAL CLINICAL IMPRESSION(S) / ED DIAGNOSES   Final diagnoses:  Cystitis   Rx / DC Orders   ED Discharge Orders          Ordered    cephALEXin (KEFLEX) 500 MG capsule  4 times daily        11/17/23 0022           Note:  This document was prepared using Dragon voice recognition software and may include unintentional dictation errors.    Romeo Apple, Jaideep Pollack A, PA-C 11/17/23 2349    Claybon Jabs, MD 11/17/23 [phone]

## 2023-11-16 NOTE — ED Triage Notes (Signed)
 Pt has been dealing with urinary retention for a couple of months. Pt says when she goes, she urinates very little and doesn't empty her bladder. Pt called urology today for appointment but can't be seen until may 7. Pt says today she had blood in her urine and is experiencing suprapubic pressure.

## 2023-11-16 NOTE — ED Provider Triage Note (Signed)
 Emergency Medicine Provider Triage Evaluation Note  Autumn Rose , a 58 y.o. female  was evaluated in triage.  Pt complains of urinary retention in the last couple of months. Patient called for urology appointment. They will be able to see her in May, they referred her to ED. Patient states having nauseas, dark urine, no fever.  Review of Systems  Positive:  Negative:   Physical Exam  BP (!) 157/91   Pulse 100   Temp 98.3 F (36.8 C) (Oral)   Resp 16   Ht 5\' 5"  (1.651 m)   Wt 76.2 kg   SpO2 100%   BMI 27.96 kg/m  Gen:   Awake, no distress   Resp:  Normal effort  MSK:   Moves extremities without difficulty  Other:    Medical Decision Making  Medically screening exam initiated at 5:19 PM.  Appropriate orders placed.  Autumn Rose was informed that the remainder of the evaluation will be completed by another provider, this initial triage assessment does not replace that evaluation, and the importance of remaining in the ED until their evaluation is complete.  Patient with urinary retention, ordered CBC,CMP,UA   Gladys Damme, PA-C 11/16/23 1722

## 2023-11-16 NOTE — ED Notes (Signed)
 Bladder scan completed at this time. of urine in bladder.

## 2023-11-17 MED ORDER — CEPHALEXIN 500 MG PO CAPS
500.0000 mg | ORAL_CAPSULE | Freq: Once | ORAL | Status: AC
  Administered 2023-11-17: 500 mg via ORAL
  Filled 2023-11-17: qty 1

## 2023-11-17 MED ORDER — CEPHALEXIN 500 MG PO CAPS: 500.0000 mg | ORAL_CAPSULE | Freq: Four times a day (QID) | ORAL | 0 refills | Status: AC

## 2023-11-17 NOTE — Discharge Instructions (Addendum)
 Please follow-up with urology for further evaluation.  Your blood work was reassuring.  Your CT abdomen and pelvis reveals high suspicion for cystitis.  Your urinalysis reveals some bacteria in your urine and which we will treat with antibiotics.  You are encouraged to close follow-up with urology please keep your scheduled appointment or contact our team's urologist for a sooner appointment.

## 2023-11-18 ENCOUNTER — Ambulatory Visit (INDEPENDENT_AMBULATORY_CARE_PROVIDER_SITE_OTHER): Admitting: Urology

## 2023-11-18 ENCOUNTER — Other Ambulatory Visit: Payer: Self-pay

## 2023-11-18 VITALS — BP 125/77 | HR 61 | Ht 65.5 in | Wt 171.2 lb

## 2023-11-18 DIAGNOSIS — N3001 Acute cystitis with hematuria: Secondary | ICD-10-CM | POA: Diagnosis not present

## 2023-11-18 DIAGNOSIS — N390 Urinary tract infection, site not specified: Secondary | ICD-10-CM

## 2023-11-18 DIAGNOSIS — R300 Dysuria: Secondary | ICD-10-CM

## 2023-11-18 NOTE — Progress Notes (Signed)
 Autumn Rose,acting as a scribe for Vanna Scotland, MD.,have documented all relevant documentation on the behalf of Vanna Scotland, MD,as directed by  Vanna Scotland, MD while in the presence of Vanna Scotland, MD.  11/18/23 11:33 AM   Autumn Rose [birth year] [id number]  Referring provider: Burnis Medin, PA-C [address] [place],  [place] [address]  Chief Complaint  Patient presents with   Establish Care    HPI: 58 year-old female who was recently seen in the emergency room on 11-16-2023 for a UTI.   At the time of evaluation, she was experiencing difficulty urinating, hematuria, and suprapubic pressure. A CT scan of the pelvis showed bladder thickening with mucosal enhancement consistent with a UTI. She was prescribed Keflex.   Urinalysis was positive, and the urine culture is growing 80,000 colonies of E. coli, though the sensitivity has not been finalized.   She reports feeling better after starting antibiotics. She had previously contacted a Insurance account manager service about a month and a half ago with similar symptoms and was prescribed an antibiotic, but symptoms persisted. She then visited an urgent care in [place], [place], where a urine test and X-ray were performed, showing a trace of bacteria, and another antibiotic was prescribed. She experienced severe symptoms, including frequent urges to urinate and pain, but no fever or recurrent infections. She denies any history of kidney stones and reports no pain typically associated with them.   PMH: Past Medical History:  Diagnosis Date   Anxiety    Complication of anesthesia    Bronchospasm after having tubal ligation in 2011   Lumbar pseudoarthrosis    PONV (postoperative nausea and vomiting)     Surgical History: Past Surgical History:  Procedure Laterality Date   ABDOMINAL EXPOSURE N/A 10/29/2022   Procedure: ABDOMINAL EXPOSURE;  Surgeon: Cephus Shelling, MD;  Location: [facility] OR;  Service:  Vascular;  Laterality: N/A;   abdominal plasty  05/2022   tummy tuck   ABLATION     ANTERIOR LUMBAR FUSION N/A 10/29/2022   Procedure: Anterior Lumbar Interbody Fusion - Lumbar Five-Sacral One with cage and revision posterior lateral fusion with replacement of pedicle screws;  Surgeon: Julio Sicks, MD;  Location: [facility] OR;  Service: Neurosurgery;  Laterality: N/A;   APPENDECTOMY  2012   BACK SURGERY     BILATERAL CARPAL TUNNEL RELEASE Bilateral 2021   November 30th & December 14th   BREAST IMPLANT REMOVAL     BREAST SURGERY     FOOT SURGERY Left    plantar fascitis   LAMINECTOMY WITH POSTERIOR LATERAL ARTHRODESIS LEVEL 1 N/A 10/29/2022   Procedure: LAMINECTOMY WITH POSTERIOR LATERAL ARTHRODESIS LEVEL ONE;  Surgeon: Julio Sicks, MD;  Location: [facility] OR;  Service: Neurosurgery;  Laterality: N/A;   LIPOMA EXCISION Left 2005   MENISCUS REPAIR Right 2012   TUBAL LIGATION     WISDOM TOOTH EXTRACTION  1991    Home Medications:  Allergies as of 11/18/2023       Reactions   Oxycodone Itching   Tape Rash        Medication List        Accurate as of November 18, 2023 11:33 AM. If you have any questions, ask your nurse or doctor.          CALCIUM 600 + D PO Take 1 tablet by mouth daily.   cephALEXin 500 MG capsule Commonly known as: KEFLEX Take 1 capsule (500 mg total) by mouth 4 (four) times  daily for 10 days.   cetirizine 10 MG tablet Commonly known as: ZYRTEC Take 10 mg by mouth daily as needed for allergies.   fish oil-omega-3 fatty acids 1000 MG capsule Take 1 g by mouth daily.   fluticasone 50 MCG/ACT nasal spray Commonly known as: FLONASE Place 1 spray into both nostrils daily as needed for allergies or rhinitis.   multivitamin with minerals tablet Take 1 tablet by mouth daily.   pantoprazole 40 MG tablet Commonly known as: PROTONIX TAKE 1 TABLET BY MOUTH TWICE A DAY   sertraline 100 MG tablet Commonly known as: ZOLOFT Take 50 mg by mouth daily.         Allergies:  Allergies  Allergen Reactions   Oxycodone Itching   Tape Rash    Family History: Family History  Problem Relation Age of Onset   Hypertension Mother    Stomach cancer Father    Pancreatic cancer Father    Hyperlipidemia Father    Cancer Father    Non-Hodgkin's lymphoma Father    Epilepsy Sister    Epilepsy Sister    Aneurysm Maternal Grandfather    Lung cancer Paternal Grandmother    Stroke Paternal Grandfather    Rectal cancer Neg Hx     Social History:  reports that she quit smoking about 37 years ago. Her smoking use included cigarettes. She has never used smokeless tobacco. She reports current alcohol use. She reports that she does not use drugs.   Physical Exam: BP 125/77   Pulse 61   Ht 5' 5.5" (1.664 m)   Wt 171 lb 4 oz (77.7 kg)   BMI 28.06 kg/m   Constitutional:  Alert and oriented, No acute distress. HEENT: [place] AT, moist mucus membranes.  Trachea midline, no masses. Neurologic: Grossly intact, no focal deficits, moving all 4 extremities. Psychiatric: Normal mood and affect.  Pertinent Imaging: EXAM: CT ABDOMEN AND PELVIS WITH CONTRAST   TECHNIQUE: Multidetector CT imaging of the abdomen and pelvis was performed using the standard protocol following bolus administration of intravenous contrast.   RADIATION DOSE REDUCTION: This exam was performed according to the departmental dose-optimization program which includes automated exposure control, adjustment of the mA and/or kV according to patient size and/or use of iterative reconstruction technique.   CONTRAST:  OMNIPAQUE IOHEXOL 300 MG/ML  SOLN   COMPARISON:  CT 05/06/2011   FINDINGS: Lower chest: Lung bases demonstrate no acute airspace disease.   Hepatobiliary: No focal liver abnormality is seen. No gallstones, gallbladder wall thickening, or biliary dilatation.   Pancreas: Unremarkable. No pancreatic ductal dilatation or surrounding inflammatory changes.   Spleen:  Normal in size without focal abnormality.   Adrenals/Urinary Tract: Adrenal glands are normal. No hydronephrosis. Mildly thick-walled urinary bladder with mucosal enhancement.   Stomach/Bowel: Stomach is within normal limits. Appendectomy. No evidence of bowel wall thickening, distention, or inflammatory changes.   Vascular/Lymphatic: No significant vascular findings are present. No enlarged abdominal or pelvic lymph nodes.   Reproductive: Uterus and bilateral adnexa are unremarkable.   Other: Negative for pelvic effusion or free air. Clips in the pelvis.   Musculoskeletal: Posterior and anterior fusion hardware at L5-S1. No acute osseous abnormality   IMPRESSION: Mildly thick-walled urinary bladder with mucosal enhancement, appearance suspicious for cystitis. Otherwise no CT evidence for acute intra-abdominal or pelvic abnormality.     Electronically Signed   By: Jasmine Pang M.D.   On: 11/16/2023 22:51  This was personally reviewed and I agree with the radiologic interpretation.  Assessment & Plan:    1. Urinary Tract Infection - She was treated with Keflex, and the urine culture is growing 80,000 colonies of E. coli.  - Awaiting final sensitivity results to confirm the appropriateness of the antibiotic.  - She reports improvement in symptoms.  - The CT scan showed bladder inflammation but no kidney stones.  - The plan is to continue Keflex and review the finalized culture results on Monday to ensure the correct antibiotic is being used.  2. Hematuria - Likely secondary to the UTI and associated bladder inflammation.  - She experienced blood clots, which may have contributed to urinary obstruction.  - No evidence of kidney stones on CT scan.  - Monitor for resolution with UTI treatment.  3. Recurrent UTI Concerns - Discussed the importance of completing the full course of antibiotics and avoiding Teladoc for UTI management due to potential for antibiotic  resistance.  - Recommended hygiene practices such as wiping front to back and urinating after intercourse.  - Suggested over-the-counter supplements for UTI prevention, including probiotics, cranberry tablets, and D-mannose.  - Advised her to seek in-person care for future UTI symptoms rather than using Teladoc.  Return if symptoms worsen or fail to improve.  I have reviewed the above documentation for accuracy and completeness, and I agree with the above.   Vanna Scotland, MD   [facility] [address] [place], [place] [address] [phone]

## 2023-11-18 NOTE — Patient Instructions (Signed)
For UTI prevention take cranberry tablets, probiotic, D-Mannose daily.  

## 2023-11-18 NOTE — Addendum Note (Signed)
 Addended by: Consuella Lose on: 11/18/2023 10:13 AM   Modules accepted: Orders

## 2023-11-19 LAB — URINE CULTURE: Culture: 80000 — AB

## 2023-12-15 ENCOUNTER — Encounter: Payer: Self-pay | Admitting: Urology

## 2023-12-16 ENCOUNTER — Ambulatory Visit: Admitting: Physician Assistant

## 2023-12-21 ENCOUNTER — Encounter: Payer: Self-pay | Admitting: Physician Assistant

## 2023-12-21 ENCOUNTER — Ambulatory Visit (INDEPENDENT_AMBULATORY_CARE_PROVIDER_SITE_OTHER): Admitting: Physician Assistant

## 2023-12-21 VITALS — BP 127/78 | HR 76 | Ht 65.5 in | Wt 167.0 lb

## 2023-12-21 DIAGNOSIS — Z09 Encounter for follow-up examination after completed treatment for conditions other than malignant neoplasm: Secondary | ICD-10-CM | POA: Diagnosis not present

## 2023-12-21 DIAGNOSIS — Z87440 Personal history of urinary (tract) infections: Secondary | ICD-10-CM

## 2023-12-21 DIAGNOSIS — N390 Urinary tract infection, site not specified: Secondary | ICD-10-CM

## 2023-12-21 LAB — MICROSCOPIC EXAMINATION: WBC, UA: >30 /HPF — AB (ref 0–5)

## 2023-12-21 LAB — URINALYSIS, COMPLETE
Bilirubin, UA: NEGATIVE
Glucose, UA: NEGATIVE
Ketones, UA: NEGATIVE
Nitrite, UA: NEGATIVE
Protein,UA: NEGATIVE
Specific Gravity, UA: 1.01 (ref 1.005–1.030)
Urobilinogen, Ur: 0.2 mg/dL (ref 0.2–1.0)
pH, UA: 6 (ref 5.0–7.5)

## 2023-12-21 LAB — BLADDER SCAN AMB NON-IMAGING

## 2023-12-21 NOTE — Progress Notes (Signed)
 12/21/2023 11:29 AM   Autumn Rose 26-Jun-1966 [id number]  CC: Chief Complaint  Patient presents with   Urinary Tract Infection   HPI: Autumn Rose is a 58 y.o. female with PMH UTIs and associated hematuria who presents today for evaluation of a possible UTI.   She saw Dr. Ace Holder in clinic last month for follow-up of UTI with hematuria and clots.  CT pelvis showed bladder thickening and she was put on Keflex .  Urine culture finalized with pansensitive E. coli.  Today she reports her symptoms resolved on Keflex , however last week she developed bladder pressure and difficulty urinating that made her question if she was having difficulty passing a clot.  She never saw 1 pass, however her symptoms have since resolved.  She feels well today.  In-office UA today positive for trace intact blood and 3+ leukocytes; urine microscopy with >30 WBCs/HPF. PVR 0mL.  PMH: Past Medical History:  Diagnosis Date   Anxiety    Complication of anesthesia    Bronchospasm after having tubal ligation in 2011   Lumbar pseudoarthrosis    PONV (postoperative nausea and vomiting)     Surgical History: Past Surgical History:  Procedure Laterality Date   ABDOMINAL EXPOSURE N/A 10/29/2022   Procedure: ABDOMINAL EXPOSURE;  Surgeon: Young Hensen, MD;  Location: [facility] OR;  Service: Vascular;  Laterality: N/A;   abdominal plasty  05/2022   tummy tuck   ABLATION     ANTERIOR LUMBAR FUSION N/A 10/29/2022   Procedure: Anterior Lumbar Interbody Fusion - Lumbar Five-Sacral One with cage and revision posterior lateral fusion with replacement of pedicle screws;  Surgeon: Agustina Aldrich, MD;  Location: [facility] OR;  Service: Neurosurgery;  Laterality: N/A;   APPENDECTOMY  2012   BACK SURGERY     BILATERAL CARPAL TUNNEL RELEASE Bilateral 2021   November 30th & December 14th   BREAST IMPLANT REMOVAL     BREAST SURGERY     FOOT SURGERY Left    plantar fascitis   LAMINECTOMY WITH POSTERIOR LATERAL ARTHRODESIS  LEVEL 1 N/A 10/29/2022   Procedure: LAMINECTOMY WITH POSTERIOR LATERAL ARTHRODESIS LEVEL ONE;  Surgeon: Agustina Aldrich, MD;  Location: [facility] OR;  Service: Neurosurgery;  Laterality: N/A;   LIPOMA EXCISION Left 2005   MENISCUS REPAIR Right 2012   TUBAL LIGATION     WISDOM TOOTH EXTRACTION  1991    Home Medications:  Allergies as of 12/21/2023       Reactions   Oxycodone  Itching   Tape Rash        Medication List        Accurate as of Dec 21, 2023 11:29 AM. If you have any questions, ask your nurse or doctor.          CALCIUM  600 + D PO Take 1 tablet by mouth daily.   cetirizine 10 MG tablet Commonly known as: ZYRTEC Take 10 mg by mouth daily as needed for allergies.   fish oil-omega-3 fatty acids  1000 MG capsule Take 1 g by mouth daily.   fluticasone  50 MCG/ACT nasal spray Commonly known as: FLONASE  Place 1 spray into both nostrils daily as needed for allergies or rhinitis.   multivitamin with minerals tablet Take 1 tablet by mouth daily.   pantoprazole  40 MG tablet Commonly known as: PROTONIX  TAKE 1 TABLET BY MOUTH TWICE A DAY   sertraline  100 MG tablet Commonly known as: ZOLOFT  Take 50 mg by mouth daily.        Allergies:  Allergies  Allergen Reactions   Oxycodone  Itching   Tape Rash    Family History: Family History  Problem Relation Age of Onset   Hypertension Mother    Stomach cancer Father    Pancreatic cancer Father    Hyperlipidemia Father    Cancer Father    Non-Hodgkin's lymphoma Father    Epilepsy Sister    Epilepsy Sister    Aneurysm Maternal Grandfather    Lung cancer Paternal Grandmother    Stroke Paternal Grandfather    Rectal cancer Neg Hx     Social History:   reports that she quit smoking about 37 years ago. Her smoking use included cigarettes. She has never used smokeless tobacco. She reports current alcohol use. She reports that she does not use drugs.  Physical Exam: BP 127/78   Pulse 76   Ht 5' 5.5" (1.664 m)   Wt  167 lb (75.8 kg)   BMI 27.37 kg/m   Constitutional:  Alert and oriented, no acute distress, nontoxic appearing HEENT: [place], AT Cardiovascular: No clubbing, cyanosis, or edema Respiratory: Normal respiratory effort, no increased work of breathing Lymph: No cervical or inguinal lymphadenopathy Skin: No rashes, bruises or suspicious lesions Neurologic: Grossly intact, no focal deficits, moving all 4 extremities Psychiatric: Normal mood and affect  Laboratory Data: Results for orders placed or performed in visit on 12/21/23  Microscopic Examination   Collection Time: 12/21/23 11:13 AM   Urine  Result Value Ref Range   WBC, UA >30 (A) 0 - 5 /hpf   RBC, Urine 0-2 0 - 2 /hpf   Epithelial Cells (non renal) 0-10 0 - 10 /hpf   Mucus, UA Present (A) Not Estab.   Bacteria, UA Few None seen/Few  Urinalysis, Complete   Collection Time: 12/21/23 11:13 AM  Result Value Ref Range   Specific Gravity, UA 1.010 1.005 - 1.030   pH, UA 6.0 5.0 - 7.5   Color, UA Yellow Yellow   Appearance Ur Clear Clear   Leukocytes,UA 3+ (A) Negative   Protein,UA Negative Negative/Trace   Glucose, UA Negative Negative   Ketones, UA Negative Negative   RBC, UA Trace (A) Negative   Bilirubin, UA Negative Negative   Urobilinogen, Ur 0.2 0.2 - 1.0 mg/dL   Nitrite, UA Negative Negative   Microscopic Examination See below:   BLADDER SCAN AMB NON-IMAGING   Collection Time: 12/21/23 11:22 AM  Result Value Ref Range   Scan Result 0ml    Assessment & Plan:   1. Recurrent UTI (Primary) UA today notable only for pyuria and she is emptying appropriately.  Will send for culture for further evaluation and treat as indicated.  We discussed that if she develops irritative symptoms before she hears from me, she may contact me and we will start her on empiric therapy.  Based on her symptoms, may consider 90 days of suppressive antibiotics to down regulate her bladder.  She is in agreement with this plan. - Urinalysis,  Complete - BLADDER SCAN AMB NON-IMAGING - CULTURE, URINE COMPREHENSIVE   Return for Will call with results.  Kathreen Pare, PA-C  [facility] [place] [address] [place], [place] [address] [phone]

## 2023-12-24 ENCOUNTER — Encounter: Payer: Self-pay | Admitting: Urology

## 2023-12-26 MED ORDER — SULFAMETHOXAZOLE-TRIMETHOPRIM 800-160 MG PO TABS: 1.0000 | ORAL_TABLET | Freq: Two times a day (BID) | ORAL | 0 refills | Status: AC

## 2023-12-26 NOTE — Telephone Encounter (Signed)
 Pt informed, meds sent, pt voiced understanding.

## 2023-12-27 LAB — CULTURE, URINE COMPREHENSIVE

## 2023-12-30 ENCOUNTER — Ambulatory Visit: Payer: Self-pay | Admitting: Physician Assistant

## 2023-12-30 DIAGNOSIS — R300 Dysuria: Secondary | ICD-10-CM

## 2023-12-30 DIAGNOSIS — N390 Urinary tract infection, site not specified: Secondary | ICD-10-CM

## 2023-12-30 MED ORDER — NITROFURANTOIN MONOHYD MACRO 100 MG PO CAPS: 100.0000 mg | ORAL_CAPSULE | Freq: Every day | ORAL | 0 refills | Status: DC

## 2024-02-29 ENCOUNTER — Other Ambulatory Visit (INDEPENDENT_AMBULATORY_CARE_PROVIDER_SITE_OTHER): Payer: Self-pay

## 2024-02-29 ENCOUNTER — Ambulatory Visit (INDEPENDENT_AMBULATORY_CARE_PROVIDER_SITE_OTHER): Admitting: Physician Assistant

## 2024-02-29 DIAGNOSIS — M25531 Pain in right wrist: Secondary | ICD-10-CM

## 2024-02-29 NOTE — Addendum Note (Signed)
 Addended by: Theta Leaf on: 02/29/2024 04:22 PM   Modules accepted: Orders

## 2024-02-29 NOTE — Progress Notes (Signed)
 Office Visit Note   Patient: Autumn Rose           Date of Birth: [birth year]           MRN: [id number] Visit Date: 02/29/2024              Requested by: Boneta, [place]  E, PA-C [address] [place],  [place] [address] PCP: Boneta, [place]  E, PA-C   Assessment & Plan: Visit Diagnoses:  1. Pain in right wrist     Plan: Impression is right wrist radial styloid pain following iron  infusion along the same area back in November.  I feel as though she may have a traumatic de Quervain's tenosynovitis from the infusion may be penetrating the tendons.  I have discussed referral to Dr. Burnetta for ultrasound-guided cortisone injection first dorsal compartment.  We have also discussed topical Voltaren and Velcro thumb spica splint.  She would like to proceed with all.  She will follow-up with us  as needed.  Call with concerns or questions the meantime.  Follow-Up Instructions: Return for F/u with Dr. Burnetta for u/s guided de'quervains inj.   Orders:  Orders Placed This Encounter  Procedures   XR Wrist Complete Right   No orders of the defined types were placed in this encounter.     Procedures: No procedures performed   Clinical Data: No additional findings.   Subjective: Chief Complaint  Patient presents with   Right Wrist - Pain    HPI patient is a pleasant 58 year old right-hand-dominant female who comes in today with right wrist pain.  Symptoms began back in November after an iron  infusion in the right wrist.  She has had pain ever since.  Symptoms have not worsened but they have not improved.  All of her pain is along the radial styloid.  Symptoms are constant without any specific aggravators.  When her pain gets really bad she also experiences a burning sensation.  Review of Systems as detailed in HPI.  All others reviewed and are negative.   Objective: Vital Signs: There were no vitals taken for this visit.  Physical Exam well-developed  well-nourished female in no acute distress.  Alert and oriented x 3.  Ortho Exam right wrist exam: She has tenderness along the radial styloid.  Really not much tenderness proximally.  She does have pain with Terrilee testing.  No pain or crepitus with grind test.  She is neurovascularly intact distally.  Specialty Comments:  No specialty comments available.  Imaging: XR Wrist Complete Right Result Date: 02/29/2024 No acute or structural abnormalities    PMFS History: Patient Active Problem List   Diagnosis Date Noted   Microcytic anemia 05/11/2023   Lumbar pseudoarthrosis 10/29/2022   Spondylolisthesis, lumbosacral region 09/22/2021    Class: Chronic   Fusion of spine of lumbar region 09/22/2021   Spondylolisthesis at L5-S1 level 06/17/2020   Dermatochalasis of both upper eyelids 05/26/2020   Myopia with presbyopia of both eyes 05/26/2020   Nuclear sclerotic cataract of both eyes 05/26/2020   Restless leg syndrome 11/23/2019   Common migraine 06/04/2019   Neuralgia of left foot 07/25/2018   Chronic fatigue 07/15/2017   Obesity due to excess calories without serious comorbidity 07/15/2017   Chest pain with moderate risk of acute coronary syndrome 09/23/2016   Abnormal EKG 09/23/2016   Cervical radiculopathy 04/03/2015   Chronic gouty arthropathy without tophi 02/28/2015   Generalized anxiety disorder 01/16/2015   Gastroesophageal reflux disease 01/24/2014   Dyspnea 01/11/2012  Appendicitis, acute 05/18/2011   Past Medical History:  Diagnosis Date   Anxiety    Complication of anesthesia    Bronchospasm after having tubal ligation in 2011   Lumbar pseudoarthrosis    PONV (postoperative nausea and vomiting)     Family History  Problem Relation Age of Onset   Hypertension Mother    Stomach cancer Father    Pancreatic cancer Father    Hyperlipidemia Father    Cancer Father    Non-Hodgkin's lymphoma Father    Epilepsy Sister    Epilepsy Sister    Aneurysm  Maternal Grandfather    Lung cancer Paternal Grandmother    Stroke Paternal Grandfather    Rectal cancer Neg Hx     Past Surgical History:  Procedure Laterality Date   ABDOMINAL EXPOSURE N/A 10/29/2022   Procedure: ABDOMINAL EXPOSURE;  Surgeon: Gretta Lonni PARAS, MD;  Location: [facility] OR;  Service: Vascular;  Laterality: N/A;   abdominal plasty  05/2022   tummy tuck   ABLATION     ANTERIOR LUMBAR FUSION N/A 10/29/2022   Procedure: Anterior Lumbar Interbody Fusion - Lumbar Five-Sacral One with cage and revision posterior lateral fusion with replacement of pedicle screws;  Surgeon: Louis Shove, MD;  Location: [facility] OR;  Service: Neurosurgery;  Laterality: N/A;   APPENDECTOMY  2012   BACK SURGERY     BILATERAL CARPAL TUNNEL RELEASE Bilateral 2021   November 30th & December 14th   BREAST IMPLANT REMOVAL     BREAST SURGERY     FOOT SURGERY Left    plantar fascitis   LAMINECTOMY WITH POSTERIOR LATERAL ARTHRODESIS LEVEL 1 N/A 10/29/2022   Procedure: LAMINECTOMY WITH POSTERIOR LATERAL ARTHRODESIS LEVEL ONE;  Surgeon: Louis Shove, MD;  Location: [facility] OR;  Service: Neurosurgery;  Laterality: N/A;   LIPOMA EXCISION Left 2005   MENISCUS REPAIR Right 2012   TUBAL LIGATION     WISDOM TOOTH EXTRACTION  1991   Social History   Occupational History   Occupation: Advertising account planner  Tobacco Use   Smoking status: Former    Current packs/day: 0.00    Types: Cigarettes    Quit date: 05/17/1986    Years since quitting: 37.8   Smokeless tobacco: Never  Vaping Use   Vaping status: Never Used  Substance and Sexual Activity   Alcohol use: Yes    Comment: occ   Drug use: No   Sexual activity: Yes    Birth control/protection: Surgical

## 2024-03-15 ENCOUNTER — Encounter: Admitting: Sports Medicine

## 2024-04-13 ENCOUNTER — Other Ambulatory Visit

## 2024-04-13 ENCOUNTER — Ambulatory Visit: Admitting: Oncology

## 2024-04-23 ENCOUNTER — Ambulatory Visit (INDEPENDENT_AMBULATORY_CARE_PROVIDER_SITE_OTHER): Admitting: Physician Assistant

## 2024-04-23 ENCOUNTER — Encounter: Payer: Self-pay | Admitting: Physician Assistant

## 2024-04-23 ENCOUNTER — Telehealth: Payer: Self-pay

## 2024-04-23 VITALS — BP 128/88 | HR 130 | Ht 65.0 in | Wt 168.0 lb

## 2024-04-23 DIAGNOSIS — N390 Urinary tract infection, site not specified: Secondary | ICD-10-CM

## 2024-04-23 LAB — URINALYSIS, COMPLETE
Bilirubin, UA: NEGATIVE
Nitrite, UA: POSITIVE — AB
Specific Gravity, UA: 1.02 (ref 1.005–1.030)
Urobilinogen, Ur: 1 mg/dL (ref 0.2–1.0)
pH, UA: 7 (ref 5.0–7.5)

## 2024-04-23 LAB — MICROSCOPIC EXAMINATION
RBC, Urine: >30 /HPF — AB (ref 0–2)
WBC, UA: >30 /HPF — AB (ref 0–5)

## 2024-04-23 MED ORDER — OXYCODONE HCL 5 MG PO TABS: 5.0000 mg | ORAL_TABLET | Freq: Four times a day (QID) | ORAL | 0 refills | Status: AC | PRN

## 2024-04-23 MED ORDER — CIPROFLOXACIN HCL 250 MG PO TABS: 250.0000 mg | ORAL_TABLET | Freq: Two times a day (BID) | ORAL | 0 refills | Status: AC

## 2024-04-23 NOTE — Telephone Encounter (Signed)
 This encounter was created in error - please disregard.

## 2024-04-23 NOTE — Telephone Encounter (Signed)
 Pt called with c/o pain in back and abdomen, Burning with urination, frequency and urgency, and blood in urine. Pt has a Hx of UTIs. I offered a 11am same day appt today and she took it. Pt voiced understanding.

## 2024-04-23 NOTE — Progress Notes (Unsigned)
 04/23/2024 1:04 PM   Marri M Nola 1966/03/16 [id number]  CC: Chief Complaint  Patient presents with   Recurrent UTI   HPI: Del M Naval is a 58 y.o. female with PMH recurrent UTIs with hematuria previously on suppressive Macrobid  who presents today for evaluation of possible UTI.   Today she reports 3 days of dysuria, malaise, frequency, urgency, and low back pain.  She took Azo and 3-4 doses of leftover Bactrim  over the weekend.  She completed 90 days of suppressive Macrobid  about a month ago.  She is postmenopausal and reports vaginal dryness.  Her GYN started her on vaginal estrogen cream about a month ago.  In-office UA today positive for brown color, 1+ glucose, 3+ protein, 3+ blood, nitrites, trace ketones, and 2+ leukocytes; urine microscopy with >30 WBCs/HPF, >30 RBCs/HPF, and many bacteria.   PMH: Past Medical History:  Diagnosis Date   Anxiety    Complication of anesthesia    Bronchospasm after having tubal ligation in 2011   Lumbar pseudoarthrosis    PONV (postoperative nausea and vomiting)     Surgical History: Past Surgical History:  Procedure Laterality Date   ABDOMINAL EXPOSURE N/A 10/29/2022   Procedure: ABDOMINAL EXPOSURE;  Surgeon: Gretta Lonni PARAS, MD;  Location: [facility] OR;  Service: Vascular;  Laterality: N/A;   abdominal plasty  05/2022   tummy tuck   ABLATION     ANTERIOR LUMBAR FUSION N/A 10/29/2022   Procedure: Anterior Lumbar Interbody Fusion - Lumbar Five-Sacral One with cage and revision posterior lateral fusion with replacement of pedicle screws;  Surgeon: Louis Shove, MD;  Location: [facility] OR;  Service: Neurosurgery;  Laterality: N/A;   APPENDECTOMY  2012   BACK SURGERY     BILATERAL CARPAL TUNNEL RELEASE Bilateral 2021   November 30th & December 14th   BREAST IMPLANT REMOVAL     BREAST SURGERY     FOOT SURGERY Left    plantar fascitis   LAMINECTOMY WITH POSTERIOR LATERAL ARTHRODESIS LEVEL 1 N/A 10/29/2022   Procedure: LAMINECTOMY WITH  POSTERIOR LATERAL ARTHRODESIS LEVEL ONE;  Surgeon: Louis Shove, MD;  Location: [facility] OR;  Service: Neurosurgery;  Laterality: N/A;   LIPOMA EXCISION Left 2005   MENISCUS REPAIR Right 2012   TUBAL LIGATION     WISDOM TOOTH EXTRACTION  1991    Home Medications:  Allergies as of 04/23/2024       Reactions   Oxycodone  Itching   Tape Rash        Medication List        Accurate as of April 23, 2024  1:04 PM. If you have any questions, ask your nurse or doctor.          CALCIUM  600 + D PO Take 1 tablet by mouth daily.   cetirizine 10 MG tablet Commonly known as: ZYRTEC Take 10 mg by mouth daily as needed for allergies.   Cholecalciferol  1.25 MG (50000 UT) capsule Take 50,000 Units by mouth.   ciprofloxacin  250 MG tablet Commonly known as: CIPRO  Take 1 tablet (250 mg total) by mouth 2 (two) times daily for 7 days. Started by: Lorrena Goranson   estradiol 0.1 MG/GM vaginal cream Commonly known as: ESTRACE Place 1 g per vagina at bedtime twice weekly   fish oil-omega-3 fatty acids  1000 MG capsule Take 1 g by mouth daily.   fluticasone  50 MCG/ACT nasal spray Commonly known as: FLONASE  Place 1 spray into both nostrils daily as needed for allergies or rhinitis.   gabapentin  300  MG capsule Commonly known as: NEURONTIN  Take 300 mg by mouth at bedtime.   hydrOXYzine 10 MG tablet Commonly known as: ATARAX Take 10 mg by mouth 3 (three) times daily as needed.   multivitamin with minerals tablet Take 1 tablet by mouth daily.   nitrofurantoin  (macrocrystal-monohydrate) 100 MG capsule Commonly known as: MACROBID  Take 1 capsule (100 mg total) by mouth daily.   oxyCODONE  5 MG immediate release tablet Commonly known as: Roxicodone  Take 1 tablet (5 mg total) by mouth every 6 (six) hours as needed for severe pain (pain score 7-10). Started by: Loetta Connelley   pantoprazole  40 MG tablet Commonly known as: PROTONIX  TAKE 1 TABLET BY MOUTH TWICE A DAY    rosuvastatin 10 MG tablet Commonly known as: CRESTOR Take 10 mg by mouth daily.   sertraline  100 MG tablet Commonly known as: ZOLOFT  Take 50 mg by mouth daily.        Allergies:  Allergies  Allergen Reactions   Oxycodone  Itching   Tape Rash    Family History: Family History  Problem Relation Age of Onset   Hypertension Mother    Stomach cancer Father    Pancreatic cancer Father    Hyperlipidemia Father    Cancer Father    Non-Hodgkin's lymphoma Father    Epilepsy Sister    Epilepsy Sister    Aneurysm Maternal Grandfather    Lung cancer Paternal Grandmother    Stroke Paternal Grandfather    Rectal cancer Neg Hx     Social History:   reports that she quit smoking about 37 years ago. Her smoking use included cigarettes. She has never used smokeless tobacco. She reports current alcohol use. She reports that she does not use drugs.  Physical Exam: BP 128/88 (BP Location: Left Arm, Patient Position: Sitting)   Pulse (!) 130   Ht 5' 5 (1.651 m)   Wt 168 lb (76.2 kg)   BMI 27.96 kg/m   Constitutional:  Alert and oriented, no acute distress, nontoxic appearing HEENT: [place], AT Cardiovascular: No clubbing, cyanosis, or edema Respiratory: Normal respiratory effort, no increased work of breathing Skin: No rashes, bruises or suspicious lesions Neurologic: Grossly intact, no focal deficits, moving all 4 extremities Psychiatric: Normal mood and affect  Laboratory Data: Results for orders placed or performed in visit on 04/23/24  Microscopic Examination   Collection Time: 04/23/24 10:46 AM   Urine  Result Value Ref Range   WBC, UA >30 (A) 0 - 5 /hpf   RBC, Urine >30 (A) 0 - 2 /hpf   Epithelial Cells (non renal) 0-10 0 - 10 /hpf   Casts Present (A) None seen /lpf   Cast Type Granular casts (A) N/A   Mucus, UA Present (A) Not Estab.   Bacteria, UA Many (A) None seen/Few  Urinalysis, Complete   Collection Time: 04/23/24 10:46 AM  Result Value Ref Range   Specific  Gravity, UA 1.020 1.005 - 1.030   pH, UA 7.0 5.0 - 7.5   Color, UA Brown (A) Yellow   Appearance Ur Cloudy (A) Clear   Leukocytes,UA 2+ (A) Negative   Protein,UA 3+ (A) Negative/Trace   Glucose, UA 1+ (A) Negative   Ketones, UA Trace (A) Negative   RBC, UA 3+ (A) Negative   Bilirubin, UA Negative Negative   Urobilinogen, Ur 1.0 0.2 - 1.0 mg/dL   Nitrite, UA Positive (A) Negative   Microscopic Examination See below:    Assessment & Plan:   1. Recurrent UTI (Primary) Her UA  still appears grossly infected despite empiric Bactrim .  I suspect this is culture inappropriate.  Will start empiric Cipro  and send for culture.  I am also sending in a few doses of oxycodone  to help her get some sleep.  She had a CTAP with contrast back in April with no stones, I do not think we need to repeat this at this time.  Will plan to resume suppressive antibiotics per culture results.  I also like to repeat UA after she clears her current infection.  If any persistent hematuria, will pursue cystoscopy. - Urinalysis, Complete - CULTURE, URINE COMPREHENSIVE - ciprofloxacin  (CIPRO ) 250 MG tablet; Take 1 tablet (250 mg total) by mouth 2 (two) times daily for 7 days.  Dispense: 14 tablet; Refill: 0 - oxyCODONE  (ROXICODONE ) 5 MG immediate release tablet; Take 1 tablet (5 mg total) by mouth every 6 (six) hours as needed for severe pain (pain score 7-10).  Dispense: 3 tablet; Refill: 0   Return for Will call to start suppressive abx per cx results.  Lucie Hones, PA-C  [facility] [address], [address] [place], [place] [address] [phone]

## 2024-04-24 ENCOUNTER — Other Ambulatory Visit: Payer: Self-pay | Admitting: Gastroenterology

## 2024-04-27 ENCOUNTER — Ambulatory Visit: Payer: Self-pay | Admitting: Physician Assistant

## 2024-04-27 LAB — CULTURE, URINE COMPREHENSIVE

## 2024-04-30 ENCOUNTER — Other Ambulatory Visit: Payer: Self-pay

## 2024-04-30 DIAGNOSIS — N390 Urinary tract infection, site not specified: Secondary | ICD-10-CM

## 2024-05-02 DIAGNOSIS — N390 Urinary tract infection, site not specified: Secondary | ICD-10-CM

## 2024-05-04 MED ORDER — CEFUROXIME AXETIL 250 MG PO TABS: 250.0000 mg | ORAL_TABLET | Freq: Two times a day (BID) | ORAL | 0 refills | Status: AC

## 2024-05-04 MED ORDER — NITROFURANTOIN MONOHYD MACRO 100 MG PO CAPS: 100.0000 mg | ORAL_CAPSULE | Freq: Every day | ORAL | 5 refills | Status: AC

## 2024-05-08 ENCOUNTER — Other Ambulatory Visit: Payer: Self-pay | Admitting: *Deleted

## 2024-05-08 DIAGNOSIS — N390 Urinary tract infection, site not specified: Secondary | ICD-10-CM

## 2024-05-09 ENCOUNTER — Other Ambulatory Visit

## 2024-05-09 DIAGNOSIS — N390 Urinary tract infection, site not specified: Secondary | ICD-10-CM

## 2024-05-09 LAB — URINALYSIS, COMPLETE
Bilirubin, UA: NEGATIVE
Glucose, UA: NEGATIVE
Ketones, UA: NEGATIVE
Leukocytes,UA: NEGATIVE
Nitrite, UA: NEGATIVE
Protein,UA: NEGATIVE
RBC, UA: NEGATIVE
Specific Gravity, UA: <1.005 — ABNORMAL LOW (ref 1.005–1.030)
Urobilinogen, Ur: 0.2 mg/dL (ref 0.2–1.0)
pH, UA: 6 (ref 5.0–7.5)

## 2024-05-09 LAB — MICROSCOPIC EXAMINATION: Bacteria, UA: NONE SEEN

## 2024-05-10 ENCOUNTER — Ambulatory Visit: Payer: Self-pay | Admitting: Physician Assistant

## 2024-05-14 ENCOUNTER — Other Ambulatory Visit

## 2024-06-11 ENCOUNTER — Encounter: Payer: Self-pay | Admitting: Radiology

## 2024-06-27 ENCOUNTER — Other Ambulatory Visit: Payer: Self-pay | Admitting: Student

## 2024-06-27 DIAGNOSIS — M5412 Radiculopathy, cervical region: Secondary | ICD-10-CM

## 2024-06-30 ENCOUNTER — Ambulatory Visit
Admission: RE | Admit: 2024-06-30 | Discharge: 2024-06-30 | Disposition: A | Discharge status: Home / Self Care | Source: Ambulatory Visit | Attending: Student | Admitting: Student

## 2024-06-30 DIAGNOSIS — M5412 Radiculopathy, cervical region: Secondary | ICD-10-CM | POA: Diagnosis present
